# Patient Record
Sex: Male | Born: 1945 | Race: White | Hispanic: No | Marital: Married | Smoking: Never smoker
Health system: Southern US, Community
[De-identification: ages and names within clinical notes are randomized; demographics above are authoritative.]

## PROBLEM LIST (undated history)

## (undated) DIAGNOSIS — I2510 Atherosclerotic heart disease of native coronary artery without angina pectoris: Secondary | ICD-10-CM

## (undated) DIAGNOSIS — F32A Depression, unspecified: Secondary | ICD-10-CM

## (undated) DIAGNOSIS — M1990 Unspecified osteoarthritis, unspecified site: Secondary | ICD-10-CM

## (undated) DIAGNOSIS — I509 Heart failure, unspecified: Secondary | ICD-10-CM

## (undated) DIAGNOSIS — Z862 Personal history of diseases of the blood and blood-forming organs and certain disorders involving the immune mechanism: Secondary | ICD-10-CM

## (undated) DIAGNOSIS — E119 Type 2 diabetes mellitus without complications: Secondary | ICD-10-CM

## (undated) DIAGNOSIS — G4730 Sleep apnea, unspecified: Secondary | ICD-10-CM

## (undated) DIAGNOSIS — K529 Noninfective gastroenteritis and colitis, unspecified: Secondary | ICD-10-CM

## (undated) DIAGNOSIS — I4891 Unspecified atrial fibrillation: Secondary | ICD-10-CM

## (undated) DIAGNOSIS — N289 Disorder of kidney and ureter, unspecified: Secondary | ICD-10-CM

## (undated) DIAGNOSIS — I499 Cardiac arrhythmia, unspecified: Secondary | ICD-10-CM

## (undated) DIAGNOSIS — I10 Essential (primary) hypertension: Secondary | ICD-10-CM

## (undated) DIAGNOSIS — Z8719 Personal history of other diseases of the digestive system: Secondary | ICD-10-CM

## (undated) DIAGNOSIS — C801 Malignant (primary) neoplasm, unspecified: Secondary | ICD-10-CM

## (undated) DIAGNOSIS — F329 Major depressive disorder, single episode, unspecified: Secondary | ICD-10-CM

## (undated) DIAGNOSIS — J189 Pneumonia, unspecified organism: Secondary | ICD-10-CM

## (undated) DIAGNOSIS — K859 Acute pancreatitis without necrosis or infection, unspecified: Secondary | ICD-10-CM

## (undated) DIAGNOSIS — R0600 Dyspnea, unspecified: Secondary | ICD-10-CM

## (undated) DIAGNOSIS — D649 Anemia, unspecified: Secondary | ICD-10-CM

## (undated) HISTORY — PX: CORONARY ANGIOPLASTY: SHX604

## (undated) HISTORY — PX: ABDOMINAL SURGERY: SHX537

## (undated) HISTORY — PX: COLONOSCOPY: SHX174

## (undated) HISTORY — PX: CHOLECYSTECTOMY: SHX55

## (undated) HISTORY — PX: TONSILLECTOMY: SUR1361

## (undated) HISTORY — PX: CARDIAC CATHETERIZATION: SHX172

---

## 2011-04-21 DIAGNOSIS — I2510 Atherosclerotic heart disease of native coronary artery without angina pectoris: Secondary | ICD-10-CM

## 2011-04-21 HISTORY — DX: Atherosclerotic heart disease of native coronary artery without angina pectoris: I25.10

## 2012-04-20 HISTORY — PX: KIDNEY TRANSPLANT: SHX239

## 2012-08-11 DIAGNOSIS — N186 End stage renal disease: Secondary | ICD-10-CM | POA: Insufficient documentation

## 2013-02-21 DIAGNOSIS — I2510 Atherosclerotic heart disease of native coronary artery without angina pectoris: Secondary | ICD-10-CM | POA: Insufficient documentation

## 2014-02-09 DIAGNOSIS — I495 Sick sinus syndrome: Secondary | ICD-10-CM | POA: Insufficient documentation

## 2014-11-19 DIAGNOSIS — I4819 Other persistent atrial fibrillation: Secondary | ICD-10-CM | POA: Insufficient documentation

## 2015-01-03 DIAGNOSIS — N189 Chronic kidney disease, unspecified: Secondary | ICD-10-CM | POA: Insufficient documentation

## 2015-09-09 DIAGNOSIS — C7A1 Malignant poorly differentiated neuroendocrine tumors: Secondary | ICD-10-CM | POA: Diagnosis not present

## 2015-09-17 DIAGNOSIS — Z940 Kidney transplant status: Secondary | ICD-10-CM | POA: Diagnosis not present

## 2015-09-17 DIAGNOSIS — N183 Chronic kidney disease, stage 3 (moderate): Secondary | ICD-10-CM | POA: Diagnosis not present

## 2015-09-17 DIAGNOSIS — Z418 Encounter for other procedures for purposes other than remedying health state: Secondary | ICD-10-CM | POA: Diagnosis not present

## 2015-09-17 DIAGNOSIS — E1122 Type 2 diabetes mellitus with diabetic chronic kidney disease: Secondary | ICD-10-CM | POA: Diagnosis not present

## 2015-09-23 DIAGNOSIS — E1101 Type 2 diabetes mellitus with hyperosmolarity with coma: Secondary | ICD-10-CM | POA: Diagnosis not present

## 2015-09-23 DIAGNOSIS — E1122 Type 2 diabetes mellitus with diabetic chronic kidney disease: Secondary | ICD-10-CM | POA: Diagnosis not present

## 2015-09-23 DIAGNOSIS — Z418 Encounter for other procedures for purposes other than remedying health state: Secondary | ICD-10-CM | POA: Diagnosis not present

## 2015-09-23 DIAGNOSIS — N183 Chronic kidney disease, stage 3 (moderate): Secondary | ICD-10-CM | POA: Diagnosis not present

## 2015-09-23 DIAGNOSIS — Z940 Kidney transplant status: Secondary | ICD-10-CM | POA: Diagnosis not present

## 2015-09-23 DIAGNOSIS — R197 Diarrhea, unspecified: Secondary | ICD-10-CM | POA: Diagnosis not present

## 2015-09-23 DIAGNOSIS — Z794 Long term (current) use of insulin: Secondary | ICD-10-CM | POA: Diagnosis not present

## 2015-09-25 DIAGNOSIS — Z940 Kidney transplant status: Secondary | ICD-10-CM | POA: Diagnosis not present

## 2015-09-30 DIAGNOSIS — Z940 Kidney transplant status: Secondary | ICD-10-CM | POA: Diagnosis not present

## 2015-09-30 DIAGNOSIS — N2581 Secondary hyperparathyroidism of renal origin: Secondary | ICD-10-CM | POA: Diagnosis not present

## 2015-09-30 DIAGNOSIS — N183 Chronic kidney disease, stage 3 (moderate): Secondary | ICD-10-CM | POA: Diagnosis not present

## 2015-09-30 DIAGNOSIS — Z5181 Encounter for therapeutic drug level monitoring: Secondary | ICD-10-CM | POA: Diagnosis not present

## 2015-09-30 DIAGNOSIS — E1165 Type 2 diabetes mellitus with hyperglycemia: Secondary | ICD-10-CM | POA: Diagnosis not present

## 2015-09-30 DIAGNOSIS — Z4822 Encounter for aftercare following kidney transplant: Secondary | ICD-10-CM | POA: Diagnosis not present

## 2015-10-07 DIAGNOSIS — R197 Diarrhea, unspecified: Secondary | ICD-10-CM | POA: Diagnosis not present

## 2015-10-07 DIAGNOSIS — Z940 Kidney transplant status: Secondary | ICD-10-CM | POA: Diagnosis not present

## 2015-10-07 DIAGNOSIS — I429 Cardiomyopathy, unspecified: Secondary | ICD-10-CM | POA: Diagnosis not present

## 2015-10-07 DIAGNOSIS — Z418 Encounter for other procedures for purposes other than remedying health state: Secondary | ICD-10-CM | POA: Diagnosis not present

## 2015-10-11 DIAGNOSIS — R634 Abnormal weight loss: Secondary | ICD-10-CM | POA: Diagnosis not present

## 2015-10-11 DIAGNOSIS — R799 Abnormal finding of blood chemistry, unspecified: Secondary | ICD-10-CM | POA: Diagnosis not present

## 2015-10-11 DIAGNOSIS — Z940 Kidney transplant status: Secondary | ICD-10-CM | POA: Diagnosis not present

## 2015-10-11 DIAGNOSIS — Z8719 Personal history of other diseases of the digestive system: Secondary | ICD-10-CM | POA: Insufficient documentation

## 2015-10-11 DIAGNOSIS — K529 Noninfective gastroenteritis and colitis, unspecified: Secondary | ICD-10-CM | POA: Diagnosis not present

## 2015-10-11 DIAGNOSIS — D638 Anemia in other chronic diseases classified elsewhere: Secondary | ICD-10-CM | POA: Diagnosis not present

## 2015-10-11 DIAGNOSIS — Z418 Encounter for other procedures for purposes other than remedying health state: Secondary | ICD-10-CM | POA: Diagnosis not present

## 2015-10-11 DIAGNOSIS — K8689 Other specified diseases of pancreas: Secondary | ICD-10-CM | POA: Diagnosis not present

## 2015-10-11 DIAGNOSIS — R197 Diarrhea, unspecified: Secondary | ICD-10-CM | POA: Diagnosis not present

## 2015-10-11 DIAGNOSIS — R161 Splenomegaly, not elsewhere classified: Secondary | ICD-10-CM | POA: Diagnosis not present

## 2015-10-12 DIAGNOSIS — K8689 Other specified diseases of pancreas: Secondary | ICD-10-CM | POA: Diagnosis not present

## 2015-10-12 DIAGNOSIS — K529 Noninfective gastroenteritis and colitis, unspecified: Secondary | ICD-10-CM | POA: Diagnosis not present

## 2015-10-23 DIAGNOSIS — N189 Chronic kidney disease, unspecified: Secondary | ICD-10-CM | POA: Diagnosis not present

## 2015-10-23 DIAGNOSIS — E875 Hyperkalemia: Secondary | ICD-10-CM | POA: Diagnosis not present

## 2015-10-23 DIAGNOSIS — R197 Diarrhea, unspecified: Secondary | ICD-10-CM | POA: Diagnosis not present

## 2015-10-23 DIAGNOSIS — K529 Noninfective gastroenteritis and colitis, unspecified: Secondary | ICD-10-CM | POA: Diagnosis not present

## 2015-10-23 DIAGNOSIS — I959 Hypotension, unspecified: Secondary | ICD-10-CM | POA: Diagnosis not present

## 2015-10-23 DIAGNOSIS — Z992 Dependence on renal dialysis: Secondary | ICD-10-CM | POA: Diagnosis not present

## 2015-10-23 DIAGNOSIS — Z940 Kidney transplant status: Secondary | ICD-10-CM | POA: Diagnosis not present

## 2015-10-23 DIAGNOSIS — Z888 Allergy status to other drugs, medicaments and biological substances status: Secondary | ICD-10-CM | POA: Diagnosis not present

## 2015-10-23 DIAGNOSIS — I129 Hypertensive chronic kidney disease with stage 1 through stage 4 chronic kidney disease, or unspecified chronic kidney disease: Secondary | ICD-10-CM | POA: Diagnosis not present

## 2015-10-23 DIAGNOSIS — E860 Dehydration: Secondary | ICD-10-CM | POA: Diagnosis not present

## 2015-10-24 DIAGNOSIS — Z940 Kidney transplant status: Secondary | ICD-10-CM | POA: Diagnosis not present

## 2015-10-26 DIAGNOSIS — K529 Noninfective gastroenteritis and colitis, unspecified: Secondary | ICD-10-CM | POA: Diagnosis not present

## 2015-11-05 DIAGNOSIS — D692 Other nonthrombocytopenic purpura: Secondary | ICD-10-CM | POA: Diagnosis not present

## 2015-11-05 DIAGNOSIS — L570 Actinic keratosis: Secondary | ICD-10-CM | POA: Diagnosis not present

## 2015-11-07 DIAGNOSIS — K9049 Malabsorption due to intolerance, not elsewhere classified: Secondary | ICD-10-CM | POA: Diagnosis not present

## 2015-11-07 DIAGNOSIS — R197 Diarrhea, unspecified: Secondary | ICD-10-CM | POA: Diagnosis not present

## 2015-11-07 DIAGNOSIS — D899 Disorder involving the immune mechanism, unspecified: Secondary | ICD-10-CM | POA: Diagnosis not present

## 2015-11-07 DIAGNOSIS — I429 Cardiomyopathy, unspecified: Secondary | ICD-10-CM | POA: Diagnosis not present

## 2015-11-07 DIAGNOSIS — I481 Persistent atrial fibrillation: Secondary | ICD-10-CM | POA: Diagnosis not present

## 2015-11-07 DIAGNOSIS — Z418 Encounter for other procedures for purposes other than remedying health state: Secondary | ICD-10-CM | POA: Diagnosis not present

## 2015-11-07 DIAGNOSIS — Z940 Kidney transplant status: Secondary | ICD-10-CM | POA: Diagnosis not present

## 2015-11-07 DIAGNOSIS — K529 Noninfective gastroenteritis and colitis, unspecified: Secondary | ICD-10-CM | POA: Diagnosis not present

## 2015-11-13 DIAGNOSIS — E1149 Type 2 diabetes mellitus with other diabetic neurological complication: Secondary | ICD-10-CM | POA: Diagnosis not present

## 2015-11-13 DIAGNOSIS — E1129 Type 2 diabetes mellitus with other diabetic kidney complication: Secondary | ICD-10-CM | POA: Diagnosis not present

## 2015-11-13 DIAGNOSIS — E1151 Type 2 diabetes mellitus with diabetic peripheral angiopathy without gangrene: Secondary | ICD-10-CM | POA: Diagnosis not present

## 2015-11-20 DIAGNOSIS — Z940 Kidney transplant status: Secondary | ICD-10-CM | POA: Diagnosis not present

## 2015-11-25 DIAGNOSIS — D631 Anemia in chronic kidney disease: Secondary | ICD-10-CM | POA: Diagnosis not present

## 2015-11-25 DIAGNOSIS — R64 Cachexia: Secondary | ICD-10-CM | POA: Diagnosis not present

## 2015-11-25 DIAGNOSIS — E46 Unspecified protein-calorie malnutrition: Secondary | ICD-10-CM | POA: Diagnosis not present

## 2015-11-25 DIAGNOSIS — N183 Chronic kidney disease, stage 3 (moderate): Secondary | ICD-10-CM | POA: Diagnosis not present

## 2015-11-25 DIAGNOSIS — E1151 Type 2 diabetes mellitus with diabetic peripheral angiopathy without gangrene: Secondary | ICD-10-CM | POA: Diagnosis not present

## 2015-11-25 DIAGNOSIS — R634 Abnormal weight loss: Secondary | ICD-10-CM | POA: Diagnosis not present

## 2015-11-25 DIAGNOSIS — N189 Chronic kidney disease, unspecified: Secondary | ICD-10-CM | POA: Diagnosis not present

## 2015-11-25 DIAGNOSIS — I129 Hypertensive chronic kidney disease with stage 1 through stage 4 chronic kidney disease, or unspecified chronic kidney disease: Secondary | ICD-10-CM | POA: Diagnosis not present

## 2015-11-25 DIAGNOSIS — I4891 Unspecified atrial fibrillation: Secondary | ICD-10-CM | POA: Diagnosis not present

## 2015-11-25 DIAGNOSIS — K863 Pseudocyst of pancreas: Secondary | ICD-10-CM | POA: Diagnosis not present

## 2015-11-25 DIAGNOSIS — K529 Noninfective gastroenteritis and colitis, unspecified: Secondary | ICD-10-CM | POA: Diagnosis not present

## 2015-12-02 DIAGNOSIS — Z4822 Encounter for aftercare following kidney transplant: Secondary | ICD-10-CM | POA: Diagnosis not present

## 2015-12-02 DIAGNOSIS — R197 Diarrhea, unspecified: Secondary | ICD-10-CM | POA: Diagnosis not present

## 2015-12-02 DIAGNOSIS — E1165 Type 2 diabetes mellitus with hyperglycemia: Secondary | ICD-10-CM | POA: Diagnosis not present

## 2015-12-02 DIAGNOSIS — N183 Chronic kidney disease, stage 3 (moderate): Secondary | ICD-10-CM | POA: Diagnosis not present

## 2015-12-02 DIAGNOSIS — Z5181 Encounter for therapeutic drug level monitoring: Secondary | ICD-10-CM | POA: Diagnosis not present

## 2015-12-02 DIAGNOSIS — Z940 Kidney transplant status: Secondary | ICD-10-CM | POA: Diagnosis not present

## 2015-12-05 DIAGNOSIS — L84 Corns and callosities: Secondary | ICD-10-CM | POA: Diagnosis not present

## 2015-12-05 DIAGNOSIS — K6389 Other specified diseases of intestine: Secondary | ICD-10-CM | POA: Diagnosis not present

## 2015-12-05 DIAGNOSIS — M109 Gout, unspecified: Secondary | ICD-10-CM | POA: Diagnosis present

## 2015-12-05 DIAGNOSIS — Z5181 Encounter for therapeutic drug level monitoring: Secondary | ICD-10-CM | POA: Diagnosis not present

## 2015-12-05 DIAGNOSIS — I429 Cardiomyopathy, unspecified: Secondary | ICD-10-CM | POA: Diagnosis not present

## 2015-12-05 DIAGNOSIS — R634 Abnormal weight loss: Secondary | ICD-10-CM | POA: Diagnosis not present

## 2015-12-05 DIAGNOSIS — K861 Other chronic pancreatitis: Secondary | ICD-10-CM | POA: Diagnosis present

## 2015-12-05 DIAGNOSIS — I151 Hypertension secondary to other renal disorders: Secondary | ICD-10-CM | POA: Diagnosis not present

## 2015-12-05 DIAGNOSIS — N2889 Other specified disorders of kidney and ureter: Secondary | ICD-10-CM | POA: Diagnosis not present

## 2015-12-05 DIAGNOSIS — L409 Psoriasis, unspecified: Secondary | ICD-10-CM | POA: Diagnosis present

## 2015-12-05 DIAGNOSIS — K862 Cyst of pancreas: Secondary | ICD-10-CM | POA: Diagnosis not present

## 2015-12-05 DIAGNOSIS — A0811 Acute gastroenteropathy due to Norwalk agent: Secondary | ICD-10-CM | POA: Diagnosis present

## 2015-12-05 DIAGNOSIS — D735 Infarction of spleen: Secondary | ICD-10-CM | POA: Diagnosis not present

## 2015-12-05 DIAGNOSIS — K5730 Diverticulosis of large intestine without perforation or abscess without bleeding: Secondary | ICD-10-CM | POA: Diagnosis not present

## 2015-12-05 DIAGNOSIS — T8619 Other complication of kidney transplant: Secondary | ICD-10-CM | POA: Diagnosis present

## 2015-12-05 DIAGNOSIS — E43 Unspecified severe protein-calorie malnutrition: Secondary | ICD-10-CM | POA: Diagnosis present

## 2015-12-05 DIAGNOSIS — L851 Acquired keratosis [keratoderma] palmaris et plantaris: Secondary | ICD-10-CM | POA: Diagnosis not present

## 2015-12-05 DIAGNOSIS — Z681 Body mass index (BMI) 19 or less, adult: Secondary | ICD-10-CM | POA: Diagnosis not present

## 2015-12-05 DIAGNOSIS — K648 Other hemorrhoids: Secondary | ICD-10-CM | POA: Diagnosis not present

## 2015-12-05 DIAGNOSIS — I10 Essential (primary) hypertension: Secondary | ICD-10-CM | POA: Diagnosis not present

## 2015-12-05 DIAGNOSIS — D7581 Myelofibrosis: Secondary | ICD-10-CM | POA: Diagnosis not present

## 2015-12-05 DIAGNOSIS — K449 Diaphragmatic hernia without obstruction or gangrene: Secondary | ICD-10-CM | POA: Diagnosis not present

## 2015-12-05 DIAGNOSIS — K529 Noninfective gastroenteritis and colitis, unspecified: Secondary | ICD-10-CM | POA: Diagnosis not present

## 2015-12-05 DIAGNOSIS — E1142 Type 2 diabetes mellitus with diabetic polyneuropathy: Secondary | ICD-10-CM | POA: Diagnosis present

## 2015-12-05 DIAGNOSIS — I120 Hypertensive chronic kidney disease with stage 5 chronic kidney disease or end stage renal disease: Secondary | ICD-10-CM | POA: Diagnosis not present

## 2015-12-05 DIAGNOSIS — K639 Disease of intestine, unspecified: Secondary | ICD-10-CM | POA: Diagnosis not present

## 2015-12-05 DIAGNOSIS — I4891 Unspecified atrial fibrillation: Secondary | ICD-10-CM | POA: Diagnosis not present

## 2015-12-05 DIAGNOSIS — I481 Persistent atrial fibrillation: Secondary | ICD-10-CM | POA: Diagnosis present

## 2015-12-05 DIAGNOSIS — Z940 Kidney transplant status: Secondary | ICD-10-CM | POA: Diagnosis not present

## 2015-12-05 DIAGNOSIS — K909 Intestinal malabsorption, unspecified: Secondary | ICD-10-CM | POA: Diagnosis not present

## 2015-12-05 DIAGNOSIS — R198 Other specified symptoms and signs involving the digestive system and abdomen: Secondary | ICD-10-CM | POA: Diagnosis not present

## 2015-12-05 DIAGNOSIS — D696 Thrombocytopenia, unspecified: Secondary | ICD-10-CM | POA: Diagnosis not present

## 2015-12-05 DIAGNOSIS — K838 Other specified diseases of biliary tract: Secondary | ICD-10-CM | POA: Diagnosis not present

## 2015-12-05 DIAGNOSIS — Z8719 Personal history of other diseases of the digestive system: Secondary | ICD-10-CM | POA: Diagnosis not present

## 2015-12-05 DIAGNOSIS — E1122 Type 2 diabetes mellitus with diabetic chronic kidney disease: Secondary | ICD-10-CM | POA: Diagnosis present

## 2015-12-05 DIAGNOSIS — E872 Acidosis: Secondary | ICD-10-CM | POA: Diagnosis present

## 2015-12-05 DIAGNOSIS — I2510 Atherosclerotic heart disease of native coronary artery without angina pectoris: Secondary | ICD-10-CM | POA: Diagnosis present

## 2015-12-05 DIAGNOSIS — D61818 Other pancytopenia: Secondary | ICD-10-CM | POA: Diagnosis present

## 2015-12-05 DIAGNOSIS — N186 End stage renal disease: Secondary | ICD-10-CM | POA: Diagnosis not present

## 2015-12-05 DIAGNOSIS — K3184 Gastroparesis: Secondary | ICD-10-CM | POA: Diagnosis present

## 2015-12-05 DIAGNOSIS — K863 Pseudocyst of pancreas: Secondary | ICD-10-CM | POA: Diagnosis present

## 2015-12-05 DIAGNOSIS — R197 Diarrhea, unspecified: Secondary | ICD-10-CM | POA: Diagnosis not present

## 2015-12-05 DIAGNOSIS — K219 Gastro-esophageal reflux disease without esophagitis: Secondary | ICD-10-CM | POA: Diagnosis present

## 2015-12-05 DIAGNOSIS — R011 Cardiac murmur, unspecified: Secondary | ICD-10-CM | POA: Diagnosis not present

## 2015-12-05 DIAGNOSIS — E1143 Type 2 diabetes mellitus with diabetic autonomic (poly)neuropathy: Secondary | ICD-10-CM | POA: Diagnosis present

## 2015-12-05 DIAGNOSIS — E860 Dehydration: Secondary | ICD-10-CM | POA: Diagnosis present

## 2015-12-05 DIAGNOSIS — K317 Polyp of stomach and duodenum: Secondary | ICD-10-CM | POA: Diagnosis not present

## 2015-12-05 DIAGNOSIS — E861 Hypovolemia: Secondary | ICD-10-CM | POA: Diagnosis present

## 2015-12-05 DIAGNOSIS — E11628 Type 2 diabetes mellitus with other skin complications: Secondary | ICD-10-CM | POA: Diagnosis not present

## 2015-12-05 DIAGNOSIS — D801 Nonfamilial hypogammaglobulinemia: Secondary | ICD-10-CM | POA: Diagnosis present

## 2015-12-05 DIAGNOSIS — E538 Deficiency of other specified B group vitamins: Secondary | ICD-10-CM | POA: Diagnosis present

## 2015-12-05 DIAGNOSIS — Z79899 Other long term (current) drug therapy: Secondary | ICD-10-CM | POA: Diagnosis not present

## 2015-12-05 DIAGNOSIS — I864 Gastric varices: Secondary | ICD-10-CM | POA: Diagnosis not present

## 2015-12-05 DIAGNOSIS — D649 Anemia, unspecified: Secondary | ICD-10-CM | POA: Diagnosis not present

## 2015-12-05 DIAGNOSIS — D899 Disorder involving the immune mechanism, unspecified: Secondary | ICD-10-CM | POA: Diagnosis not present

## 2015-12-05 DIAGNOSIS — Z418 Encounter for other procedures for purposes other than remedying health state: Secondary | ICD-10-CM | POA: Diagnosis not present

## 2015-12-05 DIAGNOSIS — N179 Acute kidney failure, unspecified: Secondary | ICD-10-CM | POA: Diagnosis present

## 2015-12-05 DIAGNOSIS — D693 Immune thrombocytopenic purpura: Secondary | ICD-10-CM | POA: Diagnosis present

## 2015-12-05 DIAGNOSIS — E1140 Type 2 diabetes mellitus with diabetic neuropathy, unspecified: Secondary | ICD-10-CM | POA: Diagnosis not present

## 2015-12-05 DIAGNOSIS — M79672 Pain in left foot: Secondary | ICD-10-CM | POA: Diagnosis not present

## 2015-12-05 DIAGNOSIS — R161 Splenomegaly, not elsewhere classified: Secondary | ICD-10-CM | POA: Diagnosis not present

## 2015-12-05 DIAGNOSIS — N189 Chronic kidney disease, unspecified: Secondary | ICD-10-CM | POA: Diagnosis present

## 2015-12-08 DIAGNOSIS — D61818 Other pancytopenia: Secondary | ICD-10-CM | POA: Insufficient documentation

## 2015-12-08 DIAGNOSIS — E538 Deficiency of other specified B group vitamins: Secondary | ICD-10-CM | POA: Insufficient documentation

## 2015-12-24 DIAGNOSIS — N189 Chronic kidney disease, unspecified: Secondary | ICD-10-CM | POA: Diagnosis not present

## 2015-12-24 DIAGNOSIS — D631 Anemia in chronic kidney disease: Secondary | ICD-10-CM | POA: Diagnosis not present

## 2015-12-24 DIAGNOSIS — R64 Cachexia: Secondary | ICD-10-CM | POA: Diagnosis not present

## 2015-12-24 DIAGNOSIS — I4891 Unspecified atrial fibrillation: Secondary | ICD-10-CM | POA: Diagnosis not present

## 2015-12-24 DIAGNOSIS — R109 Unspecified abdominal pain: Secondary | ICD-10-CM | POA: Diagnosis not present

## 2015-12-24 DIAGNOSIS — R634 Abnormal weight loss: Secondary | ICD-10-CM | POA: Diagnosis not present

## 2015-12-24 DIAGNOSIS — K529 Noninfective gastroenteritis and colitis, unspecified: Secondary | ICD-10-CM | POA: Diagnosis not present

## 2015-12-24 DIAGNOSIS — E790 Hyperuricemia without signs of inflammatory arthritis and tophaceous disease: Secondary | ICD-10-CM | POA: Diagnosis not present

## 2015-12-27 DIAGNOSIS — R197 Diarrhea, unspecified: Secondary | ICD-10-CM | POA: Diagnosis not present

## 2015-12-27 DIAGNOSIS — N186 End stage renal disease: Secondary | ICD-10-CM | POA: Diagnosis not present

## 2015-12-27 DIAGNOSIS — N179 Acute kidney failure, unspecified: Secondary | ICD-10-CM | POA: Diagnosis not present

## 2015-12-27 DIAGNOSIS — A09 Infectious gastroenteritis and colitis, unspecified: Secondary | ICD-10-CM | POA: Diagnosis not present

## 2015-12-27 DIAGNOSIS — Z8719 Personal history of other diseases of the digestive system: Secondary | ICD-10-CM | POA: Diagnosis not present

## 2015-12-27 DIAGNOSIS — R634 Abnormal weight loss: Secondary | ICD-10-CM | POA: Diagnosis not present

## 2015-12-27 DIAGNOSIS — I10 Essential (primary) hypertension: Secondary | ICD-10-CM | POA: Diagnosis not present

## 2015-12-27 DIAGNOSIS — Z940 Kidney transplant status: Secondary | ICD-10-CM | POA: Diagnosis not present

## 2015-12-27 DIAGNOSIS — A0811 Acute gastroenteropathy due to Norwalk agent: Secondary | ICD-10-CM | POA: Diagnosis not present

## 2015-12-27 DIAGNOSIS — Z418 Encounter for other procedures for purposes other than remedying health state: Secondary | ICD-10-CM | POA: Diagnosis not present

## 2015-12-27 DIAGNOSIS — I429 Cardiomyopathy, unspecified: Secondary | ICD-10-CM | POA: Diagnosis not present

## 2015-12-27 DIAGNOSIS — D899 Disorder involving the immune mechanism, unspecified: Secondary | ICD-10-CM | POA: Diagnosis not present

## 2016-01-01 DIAGNOSIS — I481 Persistent atrial fibrillation: Secondary | ICD-10-CM | POA: Diagnosis not present

## 2016-01-01 DIAGNOSIS — D899 Disorder involving the immune mechanism, unspecified: Secondary | ICD-10-CM | POA: Diagnosis not present

## 2016-01-01 DIAGNOSIS — Z940 Kidney transplant status: Secondary | ICD-10-CM | POA: Diagnosis not present

## 2016-01-01 DIAGNOSIS — I5042 Chronic combined systolic (congestive) and diastolic (congestive) heart failure: Secondary | ICD-10-CM | POA: Diagnosis not present

## 2016-01-01 DIAGNOSIS — A09 Infectious gastroenteritis and colitis, unspecified: Secondary | ICD-10-CM | POA: Diagnosis not present

## 2016-01-01 DIAGNOSIS — I2510 Atherosclerotic heart disease of native coronary artery without angina pectoris: Secondary | ICD-10-CM | POA: Diagnosis not present

## 2016-01-01 DIAGNOSIS — N179 Acute kidney failure, unspecified: Secondary | ICD-10-CM | POA: Diagnosis not present

## 2016-01-01 DIAGNOSIS — A0811 Acute gastroenteropathy due to Norwalk agent: Secondary | ICD-10-CM | POA: Diagnosis not present

## 2016-01-01 DIAGNOSIS — K4090 Unilateral inguinal hernia, without obstruction or gangrene, not specified as recurrent: Secondary | ICD-10-CM | POA: Diagnosis not present

## 2016-01-03 DIAGNOSIS — I2510 Atherosclerotic heart disease of native coronary artery without angina pectoris: Secondary | ICD-10-CM | POA: Diagnosis not present

## 2016-01-03 DIAGNOSIS — I4891 Unspecified atrial fibrillation: Secondary | ICD-10-CM | POA: Diagnosis not present

## 2016-01-03 DIAGNOSIS — I429 Cardiomyopathy, unspecified: Secondary | ICD-10-CM | POA: Diagnosis not present

## 2016-01-04 DIAGNOSIS — Z940 Kidney transplant status: Secondary | ICD-10-CM | POA: Insufficient documentation

## 2016-01-06 DIAGNOSIS — A0811 Acute gastroenteropathy due to Norwalk agent: Secondary | ICD-10-CM | POA: Diagnosis not present

## 2016-01-06 DIAGNOSIS — D899 Disorder involving the immune mechanism, unspecified: Secondary | ICD-10-CM | POA: Diagnosis not present

## 2016-01-06 DIAGNOSIS — D61818 Other pancytopenia: Secondary | ICD-10-CM | POA: Diagnosis not present

## 2016-01-06 DIAGNOSIS — Z940 Kidney transplant status: Secondary | ICD-10-CM | POA: Diagnosis not present

## 2016-01-06 DIAGNOSIS — I481 Persistent atrial fibrillation: Secondary | ICD-10-CM | POA: Diagnosis not present

## 2016-01-06 DIAGNOSIS — N179 Acute kidney failure, unspecified: Secondary | ICD-10-CM | POA: Diagnosis not present

## 2016-01-07 DIAGNOSIS — D899 Disorder involving the immune mechanism, unspecified: Secondary | ICD-10-CM | POA: Diagnosis not present

## 2016-01-07 DIAGNOSIS — I481 Persistent atrial fibrillation: Secondary | ICD-10-CM | POA: Diagnosis not present

## 2016-01-07 DIAGNOSIS — L814 Other melanin hyperpigmentation: Secondary | ICD-10-CM | POA: Diagnosis not present

## 2016-01-07 DIAGNOSIS — L570 Actinic keratosis: Secondary | ICD-10-CM | POA: Diagnosis not present

## 2016-01-07 DIAGNOSIS — Z85828 Personal history of other malignant neoplasm of skin: Secondary | ICD-10-CM | POA: Diagnosis not present

## 2016-01-07 DIAGNOSIS — L821 Other seborrheic keratosis: Secondary | ICD-10-CM | POA: Diagnosis not present

## 2016-01-07 DIAGNOSIS — I781 Nevus, non-neoplastic: Secondary | ICD-10-CM | POA: Diagnosis not present

## 2016-01-07 DIAGNOSIS — A0811 Acute gastroenteropathy due to Norwalk agent: Secondary | ICD-10-CM | POA: Diagnosis not present

## 2016-01-07 DIAGNOSIS — D61818 Other pancytopenia: Secondary | ICD-10-CM | POA: Diagnosis not present

## 2016-01-07 DIAGNOSIS — Z940 Kidney transplant status: Secondary | ICD-10-CM | POA: Diagnosis not present

## 2016-01-15 DIAGNOSIS — N186 End stage renal disease: Secondary | ICD-10-CM | POA: Diagnosis not present

## 2016-01-15 DIAGNOSIS — D899 Disorder involving the immune mechanism, unspecified: Secondary | ICD-10-CM

## 2016-01-15 DIAGNOSIS — E1159 Type 2 diabetes mellitus with other circulatory complications: Secondary | ICD-10-CM | POA: Insufficient documentation

## 2016-01-15 DIAGNOSIS — D849 Immunodeficiency, unspecified: Secondary | ICD-10-CM | POA: Insufficient documentation

## 2016-01-15 DIAGNOSIS — Z79899 Other long term (current) drug therapy: Secondary | ICD-10-CM | POA: Diagnosis not present

## 2016-01-15 DIAGNOSIS — I4891 Unspecified atrial fibrillation: Secondary | ICD-10-CM | POA: Diagnosis not present

## 2016-01-15 DIAGNOSIS — Z7901 Long term (current) use of anticoagulants: Secondary | ICD-10-CM | POA: Diagnosis not present

## 2016-01-15 DIAGNOSIS — Z8719 Personal history of other diseases of the digestive system: Secondary | ICD-10-CM | POA: Insufficient documentation

## 2016-01-15 DIAGNOSIS — E1122 Type 2 diabetes mellitus with diabetic chronic kidney disease: Secondary | ICD-10-CM | POA: Diagnosis not present

## 2016-01-15 DIAGNOSIS — I10 Essential (primary) hypertension: Secondary | ICD-10-CM | POA: Insufficient documentation

## 2016-01-15 DIAGNOSIS — K529 Noninfective gastroenteritis and colitis, unspecified: Secondary | ICD-10-CM | POA: Diagnosis not present

## 2016-01-15 DIAGNOSIS — I120 Hypertensive chronic kidney disease with stage 5 chronic kidney disease or end stage renal disease: Secondary | ICD-10-CM | POA: Diagnosis not present

## 2016-01-15 DIAGNOSIS — Z4822 Encounter for aftercare following kidney transplant: Secondary | ICD-10-CM | POA: Diagnosis not present

## 2016-01-15 DIAGNOSIS — Z940 Kidney transplant status: Secondary | ICD-10-CM | POA: Diagnosis not present

## 2016-01-21 DIAGNOSIS — D899 Disorder involving the immune mechanism, unspecified: Secondary | ICD-10-CM | POA: Diagnosis not present

## 2016-01-21 DIAGNOSIS — Z940 Kidney transplant status: Secondary | ICD-10-CM | POA: Diagnosis not present

## 2016-01-21 DIAGNOSIS — I428 Other cardiomyopathies: Secondary | ICD-10-CM | POA: Diagnosis not present

## 2016-01-21 DIAGNOSIS — A09 Infectious gastroenteritis and colitis, unspecified: Secondary | ICD-10-CM | POA: Diagnosis not present

## 2016-01-21 DIAGNOSIS — I2510 Atherosclerotic heart disease of native coronary artery without angina pectoris: Secondary | ICD-10-CM | POA: Diagnosis not present

## 2016-01-21 DIAGNOSIS — N189 Chronic kidney disease, unspecified: Secondary | ICD-10-CM | POA: Diagnosis not present

## 2016-02-12 DIAGNOSIS — Z4822 Encounter for aftercare following kidney transplant: Secondary | ICD-10-CM | POA: Diagnosis not present

## 2016-02-12 DIAGNOSIS — Z79899 Other long term (current) drug therapy: Secondary | ICD-10-CM | POA: Diagnosis not present

## 2016-03-11 DIAGNOSIS — Z87898 Personal history of other specified conditions: Secondary | ICD-10-CM | POA: Diagnosis not present

## 2016-03-11 DIAGNOSIS — Z4822 Encounter for aftercare following kidney transplant: Secondary | ICD-10-CM | POA: Diagnosis not present

## 2016-03-11 DIAGNOSIS — E1159 Type 2 diabetes mellitus with other circulatory complications: Secondary | ICD-10-CM | POA: Diagnosis not present

## 2016-03-11 DIAGNOSIS — I4891 Unspecified atrial fibrillation: Secondary | ICD-10-CM | POA: Diagnosis not present

## 2016-03-11 DIAGNOSIS — Z940 Kidney transplant status: Secondary | ICD-10-CM | POA: Diagnosis not present

## 2016-03-11 DIAGNOSIS — K859 Acute pancreatitis without necrosis or infection, unspecified: Secondary | ICD-10-CM | POA: Diagnosis not present

## 2016-03-11 DIAGNOSIS — D899 Disorder involving the immune mechanism, unspecified: Secondary | ICD-10-CM | POA: Diagnosis not present

## 2016-03-11 DIAGNOSIS — Z794 Long term (current) use of insulin: Secondary | ICD-10-CM | POA: Diagnosis not present

## 2016-03-11 DIAGNOSIS — Z79899 Other long term (current) drug therapy: Secondary | ICD-10-CM | POA: Diagnosis not present

## 2016-03-11 DIAGNOSIS — E118 Type 2 diabetes mellitus with unspecified complications: Secondary | ICD-10-CM | POA: Diagnosis not present

## 2016-03-11 DIAGNOSIS — K529 Noninfective gastroenteritis and colitis, unspecified: Secondary | ICD-10-CM | POA: Diagnosis not present

## 2016-03-18 DIAGNOSIS — I864 Gastric varices: Secondary | ICD-10-CM | POA: Diagnosis not present

## 2016-03-18 DIAGNOSIS — Z8719 Personal history of other diseases of the digestive system: Secondary | ICD-10-CM | POA: Diagnosis not present

## 2016-03-18 DIAGNOSIS — Z794 Long term (current) use of insulin: Secondary | ICD-10-CM | POA: Diagnosis not present

## 2016-03-18 DIAGNOSIS — I82890 Acute embolism and thrombosis of other specified veins: Secondary | ICD-10-CM | POA: Diagnosis not present

## 2016-03-18 DIAGNOSIS — E1159 Type 2 diabetes mellitus with other circulatory complications: Secondary | ICD-10-CM | POA: Diagnosis not present

## 2016-03-18 DIAGNOSIS — D899 Disorder involving the immune mechanism, unspecified: Secondary | ICD-10-CM | POA: Diagnosis not present

## 2016-03-18 DIAGNOSIS — K863 Pseudocyst of pancreas: Secondary | ICD-10-CM | POA: Diagnosis not present

## 2016-03-18 DIAGNOSIS — Z940 Kidney transplant status: Secondary | ICD-10-CM | POA: Diagnosis not present

## 2016-03-18 DIAGNOSIS — K529 Noninfective gastroenteritis and colitis, unspecified: Secondary | ICD-10-CM | POA: Diagnosis not present

## 2016-03-19 DIAGNOSIS — I864 Gastric varices: Secondary | ICD-10-CM | POA: Insufficient documentation

## 2016-03-19 DIAGNOSIS — I82890 Acute embolism and thrombosis of other specified veins: Secondary | ICD-10-CM | POA: Insufficient documentation

## 2016-03-23 DIAGNOSIS — E1159 Type 2 diabetes mellitus with other circulatory complications: Secondary | ICD-10-CM | POA: Diagnosis not present

## 2016-03-23 DIAGNOSIS — I2510 Atherosclerotic heart disease of native coronary artery without angina pectoris: Secondary | ICD-10-CM | POA: Diagnosis not present

## 2016-03-23 DIAGNOSIS — Z794 Long term (current) use of insulin: Secondary | ICD-10-CM | POA: Diagnosis not present

## 2016-03-23 DIAGNOSIS — I429 Cardiomyopathy, unspecified: Secondary | ICD-10-CM | POA: Diagnosis not present

## 2016-03-23 DIAGNOSIS — I482 Chronic atrial fibrillation: Secondary | ICD-10-CM | POA: Diagnosis not present

## 2016-03-23 DIAGNOSIS — Z940 Kidney transplant status: Secondary | ICD-10-CM | POA: Diagnosis not present

## 2016-03-23 DIAGNOSIS — R0609 Other forms of dyspnea: Secondary | ICD-10-CM | POA: Diagnosis not present

## 2016-03-23 DIAGNOSIS — I10 Essential (primary) hypertension: Secondary | ICD-10-CM | POA: Diagnosis not present

## 2016-03-29 DIAGNOSIS — I5042 Chronic combined systolic (congestive) and diastolic (congestive) heart failure: Secondary | ICD-10-CM | POA: Insufficient documentation

## 2016-03-31 DIAGNOSIS — K863 Pseudocyst of pancreas: Secondary | ICD-10-CM | POA: Diagnosis not present

## 2016-03-31 DIAGNOSIS — K862 Cyst of pancreas: Secondary | ICD-10-CM | POA: Diagnosis not present

## 2016-03-31 DIAGNOSIS — R161 Splenomegaly, not elsewhere classified: Secondary | ICD-10-CM | POA: Diagnosis not present

## 2016-04-06 DIAGNOSIS — I110 Hypertensive heart disease with heart failure: Secondary | ICD-10-CM | POA: Diagnosis not present

## 2016-04-06 DIAGNOSIS — E875 Hyperkalemia: Secondary | ICD-10-CM | POA: Diagnosis not present

## 2016-04-06 DIAGNOSIS — I5042 Chronic combined systolic (congestive) and diastolic (congestive) heart failure: Secondary | ICD-10-CM | POA: Diagnosis not present

## 2016-04-06 DIAGNOSIS — R0609 Other forms of dyspnea: Secondary | ICD-10-CM | POA: Diagnosis not present

## 2016-04-06 DIAGNOSIS — I10 Essential (primary) hypertension: Secondary | ICD-10-CM | POA: Diagnosis not present

## 2016-04-06 DIAGNOSIS — I428 Other cardiomyopathies: Secondary | ICD-10-CM | POA: Diagnosis not present

## 2016-04-06 DIAGNOSIS — I083 Combined rheumatic disorders of mitral, aortic and tricuspid valves: Secondary | ICD-10-CM | POA: Diagnosis not present

## 2016-04-06 DIAGNOSIS — I429 Cardiomyopathy, unspecified: Secondary | ICD-10-CM | POA: Diagnosis not present

## 2016-04-06 DIAGNOSIS — E1159 Type 2 diabetes mellitus with other circulatory complications: Secondary | ICD-10-CM | POA: Diagnosis not present

## 2016-04-06 DIAGNOSIS — I482 Chronic atrial fibrillation: Secondary | ICD-10-CM | POA: Diagnosis not present

## 2016-04-06 DIAGNOSIS — I2510 Atherosclerotic heart disease of native coronary artery without angina pectoris: Secondary | ICD-10-CM | POA: Diagnosis not present

## 2016-04-06 DIAGNOSIS — Z79899 Other long term (current) drug therapy: Secondary | ICD-10-CM | POA: Diagnosis not present

## 2016-04-08 DIAGNOSIS — I482 Chronic atrial fibrillation: Secondary | ICD-10-CM | POA: Diagnosis not present

## 2016-04-08 DIAGNOSIS — Z940 Kidney transplant status: Secondary | ICD-10-CM | POA: Diagnosis not present

## 2016-04-08 DIAGNOSIS — D899 Disorder involving the immune mechanism, unspecified: Secondary | ICD-10-CM | POA: Diagnosis not present

## 2016-04-08 DIAGNOSIS — I5042 Chronic combined systolic (congestive) and diastolic (congestive) heart failure: Secondary | ICD-10-CM | POA: Diagnosis not present

## 2016-04-16 DIAGNOSIS — L97521 Non-pressure chronic ulcer of other part of left foot limited to breakdown of skin: Secondary | ICD-10-CM | POA: Diagnosis not present

## 2016-04-16 DIAGNOSIS — E1142 Type 2 diabetes mellitus with diabetic polyneuropathy: Secondary | ICD-10-CM | POA: Diagnosis not present

## 2016-04-29 DIAGNOSIS — E1142 Type 2 diabetes mellitus with diabetic polyneuropathy: Secondary | ICD-10-CM | POA: Diagnosis not present

## 2016-04-29 DIAGNOSIS — L97521 Non-pressure chronic ulcer of other part of left foot limited to breakdown of skin: Secondary | ICD-10-CM | POA: Diagnosis not present

## 2016-05-07 DIAGNOSIS — D899 Disorder involving the immune mechanism, unspecified: Secondary | ICD-10-CM | POA: Diagnosis not present

## 2016-05-07 DIAGNOSIS — Z940 Kidney transplant status: Secondary | ICD-10-CM | POA: Diagnosis not present

## 2016-05-07 DIAGNOSIS — Z87898 Personal history of other specified conditions: Secondary | ICD-10-CM | POA: Diagnosis not present

## 2016-05-12 DIAGNOSIS — Z5181 Encounter for therapeutic drug level monitoring: Secondary | ICD-10-CM | POA: Diagnosis not present

## 2016-05-12 DIAGNOSIS — Z940 Kidney transplant status: Secondary | ICD-10-CM | POA: Diagnosis not present

## 2016-05-12 DIAGNOSIS — E118 Type 2 diabetes mellitus with unspecified complications: Secondary | ICD-10-CM | POA: Diagnosis not present

## 2016-05-17 ENCOUNTER — Emergency Department (HOSPITAL_BASED_OUTPATIENT_CLINIC_OR_DEPARTMENT_OTHER): Payer: Medicare Other

## 2016-05-17 ENCOUNTER — Emergency Department (HOSPITAL_BASED_OUTPATIENT_CLINIC_OR_DEPARTMENT_OTHER)
Admission: EM | Admit: 2016-05-17 | Discharge: 2016-05-17 | Disposition: A | Discharge status: Home / Self Care | Payer: Medicare Other | Attending: Emergency Medicine | Admitting: Emergency Medicine

## 2016-05-17 ENCOUNTER — Encounter (HOSPITAL_BASED_OUTPATIENT_CLINIC_OR_DEPARTMENT_OTHER): Payer: Self-pay | Admitting: Emergency Medicine

## 2016-05-17 DIAGNOSIS — Y999 Unspecified external cause status: Secondary | ICD-10-CM | POA: Insufficient documentation

## 2016-05-17 DIAGNOSIS — M25021 Hemarthrosis, right elbow: Secondary | ICD-10-CM | POA: Diagnosis not present

## 2016-05-17 DIAGNOSIS — Y939 Activity, unspecified: Secondary | ICD-10-CM | POA: Insufficient documentation

## 2016-05-17 DIAGNOSIS — E119 Type 2 diabetes mellitus without complications: Secondary | ICD-10-CM | POA: Diagnosis not present

## 2016-05-17 DIAGNOSIS — M25421 Effusion, right elbow: Secondary | ICD-10-CM | POA: Diagnosis not present

## 2016-05-17 DIAGNOSIS — Y929 Unspecified place or not applicable: Secondary | ICD-10-CM | POA: Diagnosis not present

## 2016-05-17 DIAGNOSIS — I509 Heart failure, unspecified: Secondary | ICD-10-CM | POA: Insufficient documentation

## 2016-05-17 DIAGNOSIS — Z79899 Other long term (current) drug therapy: Secondary | ICD-10-CM | POA: Diagnosis not present

## 2016-05-17 DIAGNOSIS — S01111A Laceration without foreign body of right eyelid and periocular area, initial encounter: Secondary | ICD-10-CM | POA: Insufficient documentation

## 2016-05-17 DIAGNOSIS — K529 Noninfective gastroenteritis and colitis, unspecified: Secondary | ICD-10-CM | POA: Insufficient documentation

## 2016-05-17 DIAGNOSIS — S52124A Nondisplaced fracture of head of right radius, initial encounter for closed fracture: Secondary | ICD-10-CM | POA: Insufficient documentation

## 2016-05-17 DIAGNOSIS — I110 Hypertensive heart disease with heart failure: Secondary | ICD-10-CM | POA: Insufficient documentation

## 2016-05-17 DIAGNOSIS — S61412A Laceration without foreign body of left hand, initial encounter: Secondary | ICD-10-CM | POA: Diagnosis not present

## 2016-05-17 DIAGNOSIS — S0181XA Laceration without foreign body of other part of head, initial encounter: Secondary | ICD-10-CM | POA: Diagnosis not present

## 2016-05-17 DIAGNOSIS — S52181A Other fracture of upper end of right radius, initial encounter for closed fracture: Secondary | ICD-10-CM | POA: Diagnosis not present

## 2016-05-17 DIAGNOSIS — W0110XA Fall on same level from slipping, tripping and stumbling with subsequent striking against unspecified object, initial encounter: Secondary | ICD-10-CM | POA: Insufficient documentation

## 2016-05-17 DIAGNOSIS — W19XXXA Unspecified fall, initial encounter: Secondary | ICD-10-CM

## 2016-05-17 DIAGNOSIS — Z7901 Long term (current) use of anticoagulants: Secondary | ICD-10-CM | POA: Diagnosis not present

## 2016-05-17 DIAGNOSIS — S0990XA Unspecified injury of head, initial encounter: Secondary | ICD-10-CM | POA: Diagnosis not present

## 2016-05-17 HISTORY — DX: Essential (primary) hypertension: I10

## 2016-05-17 HISTORY — DX: Personal history of diseases of the blood and blood-forming organs and certain disorders involving the immune mechanism: Z86.2

## 2016-05-17 HISTORY — DX: Noninfective gastroenteritis and colitis, unspecified: K52.9

## 2016-05-17 HISTORY — DX: Heart failure, unspecified: I50.9

## 2016-05-17 HISTORY — DX: Acute pancreatitis without necrosis or infection, unspecified: K85.90

## 2016-05-17 HISTORY — DX: Unspecified atrial fibrillation: I48.91

## 2016-05-17 HISTORY — DX: Type 2 diabetes mellitus without complications: E11.9

## 2016-05-17 HISTORY — DX: Disorder of kidney and ureter, unspecified: N28.9

## 2016-05-17 LAB — CBC WITH DIFFERENTIAL/PLATELET
Basophils Absolute: 0 10*3/uL (ref 0.0–0.1)
Basophils Relative: 0 %
EOS PCT: 0 %
Eosinophils Absolute: 0 10*3/uL (ref 0.0–0.7)
HEMATOCRIT: 35.9 % — AB (ref 39.0–52.0)
Hemoglobin: 11.5 g/dL — ABNORMAL LOW (ref 13.0–17.0)
LYMPHS ABS: 0.8 10*3/uL (ref 0.7–4.0)
LYMPHS PCT: 17 %
MCH: 29.6 pg (ref 26.0–34.0)
MCHC: 32 g/dL (ref 30.0–36.0)
MCV: 92.3 fL (ref 78.0–100.0)
MONO ABS: 0.4 10*3/uL (ref 0.1–1.0)
Monocytes Relative: 9 %
Neutro Abs: 3.3 10*3/uL (ref 1.7–7.7)
Neutrophils Relative %: 74 %
Platelets: 65 10*3/uL — ABNORMAL LOW (ref 150–400)
RBC: 3.89 MIL/uL — AB (ref 4.22–5.81)
RDW: 17.2 % — AB (ref 11.5–15.5)
WBC: 4.4 10*3/uL (ref 4.0–10.5)

## 2016-05-17 LAB — BASIC METABOLIC PANEL
BUN: 30 mg/dL — ABNORMAL HIGH (ref 6–20)
CALCIUM: 8.4 mg/dL — AB (ref 8.9–10.3)
CHLORIDE: 110 mmol/L (ref 101–111)
CO2: 24 mmol/L (ref 22–32)
Creatinine, Ser: 2.02 mg/dL — ABNORMAL HIGH (ref 0.61–1.24)
GFR calc Af Amer: 37 mL/min — ABNORMAL LOW (ref >60)
GFR calc non Af Amer: 32 mL/min — ABNORMAL LOW (ref >60)
Glucose, Bld: 144 mg/dL — ABNORMAL HIGH (ref 65–99)
Potassium: 5.4 mmol/L — ABNORMAL HIGH (ref 3.5–5.1)
Sodium: 136 mmol/L (ref 135–145)

## 2016-05-17 MED ORDER — SODIUM POLYSTYRENE SULFONATE 15 GM/60ML PO SUSP
15.0000 g | Freq: Once | ORAL | 0 refills | Status: AC
Start: 2016-05-17 — End: 2016-05-17

## 2016-05-17 MED ORDER — CALCIUM POLYCARBOPHIL 625 MG PO TABS: 1250.0000 mg | ORAL_TABLET | Freq: Every day | ORAL | 0 refills | Status: DC

## 2016-05-17 MED ORDER — OXYCODONE HCL 5 MG PO TABS: 5.0000 mg | ORAL_TABLET | ORAL | 0 refills | Status: DC | PRN

## 2016-05-17 NOTE — ED Notes (Signed)
Pt teaching provided on medications that may cause drowsiness. Pt instructed not to drive or operate heavy machinery while taking the prescribed medication. Pt verbalized understanding.   

## 2016-05-17 NOTE — ED Triage Notes (Signed)
Patient fell today - hit his head on some tile after tripping and fell. The patient reports that he hit his right forehead, denies LOC, right knee pain, right elbow and left hand abrasion.

## 2016-05-17 NOTE — ED Notes (Signed)
ED Provider at bedside. 

## 2016-05-17 NOTE — Discharge Instructions (Signed)
Take tylenol 1000mg  every 6 hours as needed for pain and oxycodone for breakthrough. Take the kayexalate for potassium once.  Do not start your fiber prescription for 3 days.

## 2016-05-17 NOTE — ED Notes (Signed)
Patient transported to CT 

## 2016-05-17 NOTE — ED Provider Notes (Signed)
[facility] Provider Note   CSN: [id number] Arrival date & time: 05/17/16  1253     History   Chief Complaint Chief Complaint  Patient presents with  . Fall    HPI Matthew Rowland is a 71 y.o. male.  The history is provided by the patient.  Fall  This is a new problem. The current episode started 1 to 2 hours ago. The problem occurs constantly. The problem has not changed since onset.Pertinent negatives include no chest pain, no abdominal pain, no headaches and no shortness of breath. Associated symptoms comments: Right face laceration, right elbow pain, left hand skin tears. Nothing aggravates the symptoms. Nothing relieves the symptoms. He has tried nothing for the symptoms.    Past Medical History:  Diagnosis Date  . A-fib ([place])   . CHF (congestive heart failure) ([place])   . Chronic diarrhea   . Diabetes mellitus without complication (HCC)    borderline  . History of ITP   . Hypertension   . Pancreatitis   . Renal disorder     There are no active problems to display for this patient.   Past Surgical History:  Procedure Laterality Date  . ABDOMINAL SURGERY    . CHOLECYSTECTOMY    . KIDNEY TRANSPLANT         Home Medications    Prior to Admission medications   Medication Sig Start Date End Date Taking? Authorizing Provider  pantoprazole (PROTONIX) 40 MG tablet Take 40 mg by mouth daily.   Yes Historical Provider, MD  prednisoLONE 5 MG TABS tablet Take by mouth.   Yes Historical Provider, MD  rivaroxaban (XARELTO) 20 MG TABS tablet Take 20 mg by mouth daily with supper.   Yes Historical Provider, MD  tacrolimus (PROGRAF) 1 MG capsule Take 1 mg by mouth 2 (two) times daily.   Yes Historical Provider, MD    Family History History reviewed. No pertinent family history.  Social History Social History  Substance Use Topics  . Smoking status: Never Smoker  . Smokeless tobacco: Never Used  . Alcohol use No     Allergies    Lisinopril   Review of Systems Review of Systems  Respiratory: Negative for shortness of breath.   Cardiovascular: Negative for chest pain.  Gastrointestinal: Negative for abdominal pain.  Neurological: Negative for headaches.  All other systems reviewed and are negative.    Physical Exam Updated Vital Signs BP 114/84   Pulse 84   Temp 98.4 F (36.9 C) (Oral)   Resp 18   Ht 6\' 3"  (1.905 m)   Wt 153 lb (69.4 kg)   SpO2 100%   BMI 19.12 kg/m   Physical Exam  Constitutional: He is oriented to person, place, and time. He appears well-developed and well-nourished. No distress.  HENT:  Head: Normocephalic. Head is with laceration (lateral to right eye 1.5 cm).  Nose: Nose normal.  Eyes: Conjunctivae are normal.  Neck: Neck supple. No tracheal deviation present.  Cardiovascular: Normal rate and regular rhythm.   Pulmonary/Chest: Effort normal. No respiratory distress.  Abdominal: Soft. He exhibits no distension.  Musculoskeletal:       Right elbow: He exhibits decreased range of motion (limited extension 2/2 effusion and pain) and effusion. He exhibits no deformity and no laceration.       Left hand: He exhibits laceration (superficial skin tears of dorsal hand).  Neurological: He is alert and oriented to person, place, and time.  Skin: Skin is warm and dry.  Psychiatric: He has a normal mood and affect.     ED Treatments / Results  Labs (all labs ordered are listed, but only abnormal results are displayed) Labs Reviewed  CBC WITH DIFFERENTIAL/PLATELET - Abnormal; Notable for the following:       Result Value   RBC 3.89 (*)    Hemoglobin 11.5 (*)    HCT 35.9 (*)    RDW 17.2 (*)    Platelets 65 (*)    All other components within normal limits  BASIC METABOLIC PANEL - Abnormal; Notable for the following:    Potassium 5.4 (*)    Glucose, Bld 144 (*)    BUN 30 (*)    Creatinine, Ser 2.02 (*)    Calcium 8.4 (*)    GFR calc non Af Amer 32 (*)    GFR calc Af Amer  37 (*)    Anion gap <3 (*)    All other components within normal limits    EKG  EKG Interpretation None       Radiology Dg Elbow Complete Right  Result Date: 05/17/2016 CLINICAL DATA:  Fall 6 hrs ago, some rt elbow discomfort. EXAM: RIGHT ELBOW - COMPLETE 3+ VIEW COMPARISON:  None. FINDINGS: Abnormal elbow joint effusion noted with anterior sail sign. Supinator fat pad indistinct. There is some slight irregularity of the radial head which could be from subtle fracture or spur. Suspected spurring of the coronoid process. Arterial vascular calcifications noted. IMPRESSION: 1. Abnormal exam with elbow joint effusion and effaced supinator fat pad. Questionable radial head fracture. Consider CT to better characterize given the significant possibility of occult fracture. 2. Atherosclerotic vascular calcifications. Electronically Signed   By: Van Clines M.D.   On: 05/17/2016 16:32   Ct Head Wo Contrast  Result Date: 05/17/2016 CLINICAL DATA:  71 year old male with acute fall and head injury. Patient on Xarelto. Initial encounter. EXAM: CT HEAD WITHOUT CONTRAST TECHNIQUE: Contiguous axial images were obtained from the base of the skull through the vertex without intravenous contrast. COMPARISON:  None. FINDINGS: Brain: No evidence of acute infarction, hemorrhage, hydrocephalus, extra-axial collection or mass lesion/mass effect. Mild generalized cerebral and cerebellar volume loss and mild chronic small-vessel white matter ischemic changes identified. Vascular: Intracranial atherosclerotic calcifications noted. Skull: Normal. Negative for fracture or focal lesion. Sinuses/Orbits: No acute finding. Other: Mild right facial soft tissue swelling noted. IMPRESSION: No evidence of acute intracranial abnormality. Mild right facial soft tissue swelling without evidence of fracture Mild atrophy and chronic small-vessel white matter ischemic changes. Electronically Signed   By: Margarette Canada M.D.   On:  05/17/2016 16:12    Procedures Procedures (including critical care time)  LACERATION REPAIR Performed by: Leo Grosser Authorized by: Leo Grosser Consent: Verbal consent obtained. Risks and benefits: risks, benefits and alternatives were discussed Consent given by: patient Patient identity confirmed: provided demographic data Prepped and Draped in normal sterile fashion Wound explored  Laceration Location: right forehead lateral to eye  Laceration Length: 1.5cm  No Foreign Bodies seen or palpated  Anesthesia: local infiltration  Local anesthetic: n/a Irrigation method: syringe Amount of cleaning: standard  Skin closure: glue  Number of sutures: n/a  Technique: simple  Patient tolerance: Patient tolerated the procedure well with no immediate complications.  Medications Ordered in ED Medications - No data to display   Initial Impression / Assessment and Plan / ED Course  I have reviewed the triage vital signs and the nursing notes.  Pertinent labs & imaging results that were available during  my care of the patient were reviewed by me and considered in my medical decision making (see chart for details).     71 y.o. male presents with fall from standing today striking the right side of his face lateral to his right eye and sustaining left dorsal hand skin tearing. He fell to his right elbow and has joint effusion there with presumed hemarthrosis as Pt is anticoagulated on xarelto. XR and PE findings are c/w radial head fx on right, discussed earlyu mobility as definitive management and sports med f/u offered. Labs drawn appear at baseline. No ICH or other concerning findings on CT head. Laceration repaired with glue applied to skin tears too and discussed wound care. Pt ambulated at baseline and discharge with return precautions discussed. Has multiple years of ongoing chronic diarrhea and has had extensive workup with multiple therapies but has naver tried stool bulking  agents/fiber so will trial this pending routine f/u.  Final Clinical Impressions(s) / ED Diagnoses   Final diagnoses:  Fall from standing, initial encounter  Facial laceration, initial encounter  Skin tear of hand without complication, left, initial encounter  Hemarthrosis, right elbow  Closed nondisplaced fracture of head of right radius, initial encounter  Chronic diarrhea    New Prescriptions New Prescriptions   No medications on file     Leo Grosser, MD 05/18/16 [phone]

## 2016-05-17 NOTE — ED Provider Notes (Signed)
Received care of patient from Dr. Laneta Simmers. Please see his note for prior history and physical. Briefly this is a 71 year old male with history of atrial fibrillation, CHF, diabetes, renal transplant who presented with concern for fall. Patient also reports chronic diarrhea. CT head showed no acute findings. Radial head fracture noted, and follow-up and recommendations discussed with Dr. Laneta Simmers and patient. Labs pending at time of my assumption of care.   Labs show very mild hyperkalemia of 5.4. We'll give patient one dose of Kayexalate, recommend follow-up with his PCP regarding recheck. He is noted to have similarly elevated levels in care everywhere, as well as similar creatinine. Discussed all results with patient and recommend continued outpt follow up. Patient discharged in stable condition with understanding of reasons to return.    Gareth Morgan, MD 05/19/16 [phone]

## 2016-05-20 ENCOUNTER — Ambulatory Visit (INDEPENDENT_AMBULATORY_CARE_PROVIDER_SITE_OTHER): Payer: Medicare Other | Admitting: Family Medicine

## 2016-05-20 ENCOUNTER — Ambulatory Visit (HOSPITAL_BASED_OUTPATIENT_CLINIC_OR_DEPARTMENT_OTHER)
Admission: RE | Admit: 2016-05-20 | Discharge: 2016-05-20 | Disposition: A | Discharge status: Home / Self Care | Payer: Medicare Other | Source: Ambulatory Visit | Attending: Family Medicine | Admitting: Family Medicine

## 2016-05-20 ENCOUNTER — Encounter: Payer: Self-pay | Admitting: Family Medicine

## 2016-05-20 VITALS — BP 125/84 | HR 120 | Ht 75.0 in | Wt 150.0 lb

## 2016-05-20 DIAGNOSIS — M19011 Primary osteoarthritis, right shoulder: Secondary | ICD-10-CM | POA: Insufficient documentation

## 2016-05-20 DIAGNOSIS — S42114A Nondisplaced fracture of body of scapula, right shoulder, initial encounter for closed fracture: Secondary | ICD-10-CM | POA: Diagnosis not present

## 2016-05-20 DIAGNOSIS — S42111A Displaced fracture of body of scapula, right shoulder, initial encounter for closed fracture: Secondary | ICD-10-CM | POA: Insufficient documentation

## 2016-05-20 DIAGNOSIS — S4991XA Unspecified injury of right shoulder and upper arm, initial encounter: Secondary | ICD-10-CM | POA: Diagnosis present

## 2016-05-20 DIAGNOSIS — W19XXXA Unspecified fall, initial encounter: Secondary | ICD-10-CM | POA: Diagnosis not present

## 2016-05-20 DIAGNOSIS — S59901A Unspecified injury of right elbow, initial encounter: Secondary | ICD-10-CM | POA: Diagnosis not present

## 2016-05-20 DIAGNOSIS — S59901D Unspecified injury of right elbow, subsequent encounter: Secondary | ICD-10-CM | POA: Insufficient documentation

## 2016-05-20 DIAGNOSIS — S42191A Fracture of other part of scapula, right shoulder, initial encounter for closed fracture: Secondary | ICD-10-CM | POA: Diagnosis not present

## 2016-05-20 NOTE — Patient Instructions (Signed)
You have an effusion (blood, fluid) in your elbow joint but the x-rays do not show a fracture. You either badly bruised the joint to cause this or have a subtle fracture. Regardless they're treated similarly - ice only if needed, tylenol 500mg  1-2 tabs three times a day if needed for pain. Do motion exercises as I showed you to regain the extension. Follow up with me in 1 1/2 weeks for reevaluation.  On one view of your scapular x-rays you appear to have a small fracture of the body of the scapula (shoulder blade). Treatment is as noted above. You don't need a cast, sling or anything at this point. Icing as needed, tylenol.

## 2016-05-20 NOTE — Assessment & Plan Note (Signed)
independently reviewed radiographs showing this on lateral view.  No involvement of glenoid.  Reassured patient. Expect 6-8 weeks to resolve.  Icing, tylenol as noted above.  F/u in 1 1/2 weeks.

## 2016-05-20 NOTE — Assessment & Plan Note (Signed)
independently reviewed radiographs and he does have an effusion.  Possible he has a nondisplaced radial head fracture but radiographs only show spurring.  Clinically doing very well.  Shown motion exercises to regain full extensions.  Tylenol, icing only if needed.  F/u in 1 1/2 weeks to reevaluate.

## 2016-05-20 NOTE — Progress Notes (Signed)
PCP: Lamar Blinks MD  Subjective:   HPI: Patient is a 71 y.o. male here for right elbow injury.  Patient reports he was at church on 1/28 when he tripped over a chair and fell down onto right side. Put his hands out to brace himself. Scraped up left hand - glued lacerations together in ED. Also with black eye on right. No loss of consciousness. Has pain at 3/10 level right elbow worse when trying to extend elbow, dull. Also with pain right scapular area - feels if he pushes on right pectoral region. Is right handed. Some swelling bruising of right elbow. No numbness, other skin changes.  Past Medical History:  Diagnosis Date  . A-fib ([place])   . CHF (congestive heart failure) ([place])   . Chronic diarrhea   . Diabetes mellitus without complication (HCC)    borderline  . History of ITP   . Hypertension   . Pancreatitis   . Renal disorder     Current Outpatient Prescriptions on File Prior to Visit  Medication Sig Dispense Refill  . oxyCODONE (OXY IR/ROXICODONE) 5 MG immediate release tablet Take 1 tablet (5 mg total) by mouth every 4 (four) hours as needed for severe pain. 10 tablet 0  . pantoprazole (PROTONIX) 40 MG tablet Take 40 mg by mouth daily.    . polycarbophil (FIBERCON) 625 MG tablet Take 2 tablets (1,250 mg total) by mouth daily. 30 tablet 0  . prednisoLONE 5 MG TABS tablet Take by mouth.    . rivaroxaban (XARELTO) 20 MG TABS tablet Take 20 mg by mouth daily with supper.    . tacrolimus (PROGRAF) 1 MG capsule Take 1 mg by mouth 2 (two) times daily.     No current facility-administered medications on file prior to visit.     Past Surgical History:  Procedure Laterality Date  . ABDOMINAL SURGERY    . CHOLECYSTECTOMY    . KIDNEY TRANSPLANT      Allergies  Allergen Reactions  . Atorvastatin Itching    Incessant itching which stopped when he stopped the atorvastatin.  Marland Kitchen Lisinopril Other (See Comments)    Causes pancreatitis     Social History   Social  History  . Marital status: Married    Spouse name: N/A  . Number of children: N/A  . Years of education: N/A   Occupational History  . Not on file.   Social History Main Topics  . Smoking status: Never Smoker  . Smokeless tobacco: Never Used  . Alcohol use No  . Drug use: No  . Sexual activity: Not on file   Other Topics Concern  . Not on file   Social History Narrative  . No narrative on file    No family history on file.  BP 125/84   Pulse (!) 120   Ht 6\' 3"  (1.905 m)   Wt 150 lb (68 kg)   BMI 18.75 kg/m   Review of Systems: See HPI above.     Objective:  Physical Exam:  Gen: NAD, comfortable in exam room  Right shoulder: Diffuse muscle atrophy.  No swelling, ecchymoses.  No gross deformity. TTP right scapular body.  No other tenderness about shoulder. FROM without pain. Negative Hawkins, Neers. Negative Yergasons. Strength 5/5 with empty can and resisted internal/external rotation. Negative apprehension. NV intact distally.  Right elbow: Mild swelling, bruising posterior elbow.  No other deformity. No TTP except superficially over bruise.  No radial head, other tenderness. Collateral ligaments intact. Full flexion -  lacks about 10 degrees extension. NVI distally.  Assessment & Plan:  1. Right elbow injury - independently reviewed radiographs and he does have an effusion.  Possible he has a nondisplaced radial head fracture but radiographs only show spurring.  Clinically doing very well.  Shown motion exercises to regain full extensions.  Tylenol, icing only if needed.  F/u in 1 1/2 weeks to reevaluate.  2. Right scapular body fracture - independently reviewed radiographs showing this on lateral view.  No involvement of glenoid.  Reassured patient. Expect 6-8 weeks to resolve.  Icing, tylenol as noted above.  F/u in 1 1/2 weeks.

## 2016-05-22 DIAGNOSIS — Z940 Kidney transplant status: Secondary | ICD-10-CM | POA: Diagnosis not present

## 2016-05-22 DIAGNOSIS — Z79899 Other long term (current) drug therapy: Secondary | ICD-10-CM | POA: Diagnosis not present

## 2016-05-22 DIAGNOSIS — K529 Noninfective gastroenteritis and colitis, unspecified: Secondary | ICD-10-CM | POA: Diagnosis not present

## 2016-05-22 DIAGNOSIS — Z5181 Encounter for therapeutic drug level monitoring: Secondary | ICD-10-CM | POA: Diagnosis not present

## 2016-05-22 DIAGNOSIS — K863 Pseudocyst of pancreas: Secondary | ICD-10-CM | POA: Diagnosis not present

## 2016-05-22 DIAGNOSIS — I864 Gastric varices: Secondary | ICD-10-CM | POA: Diagnosis not present

## 2016-05-22 DIAGNOSIS — D899 Disorder involving the immune mechanism, unspecified: Secondary | ICD-10-CM | POA: Diagnosis not present

## 2016-05-27 DIAGNOSIS — L03115 Cellulitis of right lower limb: Secondary | ICD-10-CM | POA: Diagnosis not present

## 2016-05-27 DIAGNOSIS — L97512 Non-pressure chronic ulcer of other part of right foot with fat layer exposed: Secondary | ICD-10-CM | POA: Diagnosis not present

## 2016-05-28 DIAGNOSIS — K529 Noninfective gastroenteritis and colitis, unspecified: Secondary | ICD-10-CM | POA: Diagnosis not present

## 2016-06-01 ENCOUNTER — Ambulatory Visit: Payer: Medicare Other | Admitting: Family Medicine

## 2016-06-01 ENCOUNTER — Ambulatory Visit (INDEPENDENT_AMBULATORY_CARE_PROVIDER_SITE_OTHER): Payer: Medicare Other | Admitting: Family Medicine

## 2016-06-01 ENCOUNTER — Encounter: Payer: Self-pay | Admitting: Family Medicine

## 2016-06-01 DIAGNOSIS — S59901D Unspecified injury of right elbow, subsequent encounter: Secondary | ICD-10-CM | POA: Diagnosis not present

## 2016-06-01 DIAGNOSIS — S42114D Nondisplaced fracture of body of scapula, right shoulder, subsequent encounter for fracture with routine healing: Secondary | ICD-10-CM

## 2016-06-01 DIAGNOSIS — K863 Pseudocyst of pancreas: Secondary | ICD-10-CM | POA: Diagnosis not present

## 2016-06-01 NOTE — Patient Instructions (Signed)
Follow up with me in 4 weeks or as needed. Call me if you have any questions.

## 2016-06-02 DIAGNOSIS — L97511 Non-pressure chronic ulcer of other part of right foot limited to breakdown of skin: Secondary | ICD-10-CM | POA: Diagnosis not present

## 2016-06-02 NOTE — Assessment & Plan Note (Signed)
seen on lateral view.  Surprisingly very little pain now.  Tylenol, icing only if needed.  F/u in 4 weeks or prn.

## 2016-06-02 NOTE — Progress Notes (Signed)
PCP: Lamar Blinks MD  Subjective:   HPI: Patient is a 71 y.o. male here for right elbow injury.  1/31: Patient reports he was at church on 1/28 when he tripped over a chair and fell down onto right side. Put his hands out to brace himself. Scraped up left hand - glued lacerations together in ED. Also with black eye on right. No loss of consciousness. Has pain at 3/10 level right elbow worse when trying to extend elbow, dull. Also with pain right scapular area - feels if he pushes on right pectoral region. Is right handed. Some swelling bruising of right elbow. No numbness, other skin changes.  2/12: Patient reports he is doing well. Currently has no pain. With certain movements can feel some very slight soreness in right shoulder blade. Has almost full motion of elbow now with minimal discomfort on full extension. Not taking anything for pain. No skin changes, swelling, numbness.  Past Medical History:  Diagnosis Date  . A-fib ([place])   . CHF (congestive heart failure) ([place])   . Chronic diarrhea   . Diabetes mellitus without complication (HCC)    borderline  . History of ITP   . Hypertension   . Pancreatitis   . Renal disorder     Current Outpatient Prescriptions on File Prior to Visit  Medication Sig Dispense Refill  . diphenoxylate-atropine (LOMOTIL) 2.5-0.025 MG tablet TK 1 T PO TID PRF DH  2  . finasteride (PROSCAR) 5 MG tablet     . metoprolol (TOPROL-XL) 200 MG 24 hr tablet     . oxyCODONE (OXY IR/ROXICODONE) 5 MG immediate release tablet Take 1 tablet (5 mg total) by mouth every 4 (four) hours as needed for severe pain. 10 tablet 0  . pantoprazole (PROTONIX) 40 MG tablet Take 40 mg by mouth daily.    . polycarbophil (FIBERCON) 625 MG tablet Take 2 tablets (1,250 mg total) by mouth daily. 30 tablet 0  . prednisoLONE 5 MG TABS tablet Take by mouth.    . rivaroxaban (XARELTO) 20 MG TABS tablet Take 20 mg by mouth daily with supper.    . tacrolimus (PROGRAF) 1 MG  capsule Take 1 mg by mouth 2 (two) times daily.    Marland Kitchen ZENPEP 25000 units CPEP      No current facility-administered medications on file prior to visit.     Past Surgical History:  Procedure Laterality Date  . ABDOMINAL SURGERY    . CHOLECYSTECTOMY    . KIDNEY TRANSPLANT      Allergies  Allergen Reactions  . Atorvastatin Itching    Incessant itching which stopped when he stopped the atorvastatin.  Marland Kitchen Lisinopril Other (See Comments)    Causes pancreatitis     Social History   Social History  . Marital status: Married    Spouse name: N/A  . Number of children: N/A  . Years of education: N/A   Occupational History  . Not on file.   Social History Main Topics  . Smoking status: Never Smoker  . Smokeless tobacco: Never Used  . Alcohol use No  . Drug use: No  . Sexual activity: Not on file   Other Topics Concern  . Not on file   Social History Narrative  . No narrative on file    No family history on file.  BP 118/69   Pulse (!) 121   Ht 6\' 3"  (1.905 m)   Wt 150 lb (68 kg)   BMI 18.75 kg/m   Review  of Systems: See HPI above.     Objective:  Physical Exam:  Gen: NAD, comfortable in exam room  Right shoulder: Diffuse muscle atrophy.  No swelling, ecchymoses.  No gross deformity. No TTP right scapular body.  No other tenderness about shoulder. FROM without pain. Negative Hawkins, Neers. Strength 5/5 with empty can and resisted internal/external rotation. NV intact distally.  Right elbow: No swelling, bruising, other deformity. No TTP.  No radial head, other tenderness. Collateral ligaments intact. Lacks only a few degrees of extension now.  Full flexion. NVI distally.  Assessment & Plan:  1. Right elbow injury - Effusion noted on prior radiographs. No tenderness and has almost fully regained motion.  Consistent with contusion.  Tylenol, icing only if needed.  F/u in 4 weeks or prn.  2. Right scapular body fracture - seen on lateral view.   Surprisingly very little pain now.  Tylenol, icing only if needed.  F/u in 4 weeks or prn.

## 2016-06-02 NOTE — Assessment & Plan Note (Signed)
Effusion noted on prior radiographs. No tenderness and has almost fully regained motion.  Consistent with contusion.  Tylenol, icing only if needed.  F/u in 4 weeks or prn.

## 2016-06-03 DIAGNOSIS — D899 Disorder involving the immune mechanism, unspecified: Secondary | ICD-10-CM | POA: Diagnosis not present

## 2016-06-03 DIAGNOSIS — Z4822 Encounter for aftercare following kidney transplant: Secondary | ICD-10-CM | POA: Diagnosis not present

## 2016-06-08 DIAGNOSIS — Z940 Kidney transplant status: Secondary | ICD-10-CM | POA: Diagnosis not present

## 2016-06-08 DIAGNOSIS — I5042 Chronic combined systolic (congestive) and diastolic (congestive) heart failure: Secondary | ICD-10-CM | POA: Diagnosis not present

## 2016-06-08 DIAGNOSIS — I2510 Atherosclerotic heart disease of native coronary artery without angina pectoris: Secondary | ICD-10-CM | POA: Diagnosis not present

## 2016-06-08 DIAGNOSIS — I482 Chronic atrial fibrillation: Secondary | ICD-10-CM | POA: Diagnosis not present

## 2016-06-08 DIAGNOSIS — I10 Essential (primary) hypertension: Secondary | ICD-10-CM | POA: Diagnosis not present

## 2016-06-08 DIAGNOSIS — I428 Other cardiomyopathies: Secondary | ICD-10-CM | POA: Diagnosis not present

## 2016-06-10 DIAGNOSIS — Z940 Kidney transplant status: Secondary | ICD-10-CM | POA: Diagnosis not present

## 2016-06-16 DIAGNOSIS — E1142 Type 2 diabetes mellitus with diabetic polyneuropathy: Secondary | ICD-10-CM | POA: Diagnosis not present

## 2016-06-16 DIAGNOSIS — M7741 Metatarsalgia, right foot: Secondary | ICD-10-CM | POA: Diagnosis not present

## 2016-06-19 ENCOUNTER — Telehealth: Payer: Self-pay

## 2016-06-22 ENCOUNTER — Ambulatory Visit (INDEPENDENT_AMBULATORY_CARE_PROVIDER_SITE_OTHER): Payer: Medicare Other | Admitting: Family Medicine

## 2016-06-22 ENCOUNTER — Encounter: Payer: Self-pay | Admitting: Family Medicine

## 2016-06-22 VITALS — BP 120/82 | HR 84 | Temp 97.5°F | Ht 75.0 in | Wt 162.6 lb

## 2016-06-22 DIAGNOSIS — I10 Essential (primary) hypertension: Secondary | ICD-10-CM

## 2016-06-22 DIAGNOSIS — J300 Vasomotor rhinitis: Secondary | ICD-10-CM | POA: Diagnosis not present

## 2016-06-22 DIAGNOSIS — E118 Type 2 diabetes mellitus with unspecified complications: Secondary | ICD-10-CM | POA: Diagnosis not present

## 2016-06-22 DIAGNOSIS — I482 Chronic atrial fibrillation, unspecified: Secondary | ICD-10-CM

## 2016-06-22 DIAGNOSIS — Z23 Encounter for immunization: Secondary | ICD-10-CM

## 2016-06-22 DIAGNOSIS — Z940 Kidney transplant status: Secondary | ICD-10-CM | POA: Diagnosis not present

## 2016-06-22 MED ORDER — IPRATROPIUM BROMIDE 0.03 % NA SOLN: 2.0000 | Freq: Four times a day (QID) | NASAL | 6 refills | Status: DC

## 2016-06-22 NOTE — Progress Notes (Signed)
Pre visit review using our clinic tool,if applicable. No additional management support is needed unless otherwise documented below in the visit note.  

## 2016-06-22 NOTE — Patient Instructions (Addendum)
Please keep your glucose meter on hand and check your sugars on occasion Let's have you come in for a lab visit only / A1c in 2-3 months The best thing you can probably do for your skin is to keep it moisturized and protected with a thick moisturizer such as cepaphil  [facility] may be a good choice for you nearby  Address: [address] [place], [place] [address]

## 2016-06-22 NOTE — Progress Notes (Signed)
[place] at [facility] [address], [place], [place] [address] [phone]  Date:  06/22/2016   Name:  Matthew Rowland   DOB:  12/29/1945   MRN:  [id number]  PCP:  Lamar Blinks, MD    Chief Complaint: Establish Care   History of Present Illness:  LAEL PILCH is a 71 y.o. very pleasant male patient who presents with the following:  Here today as a new patient.   His cardiologist, GI, nephology are all at [facility]. He needs a PCP to help him in case he were to get sick, and with other more general needs He was born in [place], he had been living in [place] until last fall.  Moved to this area in October of last year with his wife to be closer to family. He has a complex PMHx including atrial fib, CAD, CHF, HTN, CKD s/p kidney transplant and past history of pancreatitis   He thinks he first developed his kidney problmes when he had pancreatitis and then renal failure back in 2004.  He eventually had a transplant in 2014; he got the kidney from his wife.   The kidney is doing pretty well but he does not have a normal creat He has chronic low platelets- this also appeared after he had the pancreatitis.  They think this maybe due to his splenomegaly.  Has never been severe enough to cause him any problems  He was first dx with a fib in 2009 he thinks.  In 2013 he had an ablation for same; while still in the hospital he developed CP and underwent a cath. He was found to have a serious blockage and underwent a stent.   He had another ablation that also did not work and now just remains in a fib He is generally in a fib, and his rate is usually around 100 per his report His most recent echo was in December 20170 EF 40% He is on xarelto and troprol He does not have any spontaneous bleeding, but he will bleed and bruise easily between his prednisone and xarelto  He continues to have some issues with his pancreatitis.  Reports that he has  seen a lot of good doctors both here and in [place], and no one has been able to give him a reason for his diarrhea.  He is better on lomotil and pancreatic enzymes- he takes the zenpep TID, 3 pills each dose.    He has a history of borderline DM- he is not on any medication for same at this time.  He lost a lot of weight when he was so ill, which is good in that he no longer requires insulin His A1c was checked recently and it ws approx 7%. He was on insulin and had a pump actually in the past  He does not check his blood sugar at home anymore.  Does not have a working meter.  Would like to get one He finds that many healthy foods will irritate his stomach.  He does best with easier to digest foods but these are often cookies, etc  He notes he has a persistent nasal drip.  He has noticed this for 2-3 years.    Also, prior to leaving [place] he was doing some traveling, and started to itch. He used some gold bond and got better. He has not noted any real differnce when he takes a shower/ hot water exposure   He does  have BPH but this is not severe.  He would like to try atrovent nasal for his rhinorrhea which can be embarrassing.  He notes that his nose will often drip and people will think he is ill, he feels self conscious about having to wife his noseHe understands that if his urinary sx worsen he should stop this medication  I have reviewed notes from [place] system.  History is supplemented by his wife who is at the visit  He has not had a flu shot this year and would like to have one. He knows that some of his medications (prednisolone, prograf) may lessen the shot's efficacy   Patient Active Problem List   Diagnosis Date Noted  . Elbow injury, right, subsequent encounter 05/20/2016  . Fracture of right scapular body 05/20/2016  . Chronic combined systolic and diastolic CHF (congestive heart failure) ([place]) 03/29/2016  . Cardiomyopathy ([place]) 03/23/2016  . 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 Essential hypertension 01/15/2016  . H/O acute pancreatitis 01/15/2016  . Immunosuppression ([place]) 01/15/2016  . Type 2 diabetes mellitus with circulatory disorder ([place]) 01/15/2016  . Hx of kidney transplant 01/04/2016  . Folate deficiency 12/08/2015  . Pancytopenia ([place]) 12/08/2015  . History of pancreatitis 10/11/2015  . CKD (chronic kidney disease) 01/03/2015  . Atrial fibrillation, persistent ([place]) 11/19/2014  . SSS (sick sinus syndrome) ([place]) 02/09/2014  . CAD (coronary artery disease) 02/21/2013  . ESRD (end stage renal disease) ([place]) 08/11/2012    Past Medical History:  Diagnosis Date  . A-fib ([place])   . CHF (congestive heart failure) ([place])   . Chronic diarrhea   . Diabetes mellitus without complication (HCC)    borderline  . History of ITP   . Hypertension   . Pancreatitis   . Renal disorder     Past Surgical History:  Procedure Laterality Date  . ABDOMINAL SURGERY    . CHOLECYSTECTOMY    . KIDNEY TRANSPLANT      Social History  Substance Use Topics  . Smoking status: Never Smoker  . Smokeless tobacco: Never Used  . Alcohol use No    No family history on file.  Allergies  Allergen Reactions  . Atorvastatin Itching    Incessant itching which stopped when he stopped the atorvastatin.  Marland Kitchen Lisinopril Other (See Comments)    Causes pancreatitis     Medication list has been reviewed and updated.  Current Outpatient Prescriptions on File Prior to Visit  Medication Sig Dispense Refill  . diphenoxylate-atropine (LOMOTIL) 2.5-0.025 MG tablet TK 1 T PO TID PRF DH  2  . finasteride (PROSCAR) 5 MG tablet     . metoprolol (TOPROL-XL) 200 MG 24 hr tablet     . pantoprazole (PROTONIX) 40 MG tablet Take 40 mg by mouth daily.    . polycarbophil (FIBERCON) 625 MG tablet Take 2 tablets (1,250 mg total) by mouth daily. 30 tablet 0  . prednisoLONE 5 MG TABS tablet Take by mouth.    . rivaroxaban (XARELTO) 20 MG TABS tablet Take 20  mg by mouth daily with supper.    . tacrolimus (PROGRAF) 1 MG capsule Take 1 mg by mouth 2 (two) times daily.    Marland Kitchen ZENPEP 25000 units CPEP      No current facility-administered medications on file prior to visit.     Review of Systems:  As per HPI- otherwise negative. No fever or chills No rash No symptoms of hypo or hyperglycemia No spontaneous bleeding  Positive for easy bruising    Physical Examination: Vitals:   06/22/16 1508  BP: 120/82  Pulse: 84  Temp: 97.5 F (36.4 C)   Vitals:   06/22/16 1508  Weight: 162 lb 9.6 oz (73.8 kg)  Height: 6\' 3"  (1.905 m)   Body mass index is 20.32 kg/m. Ideal Body Weight: Weight in (lb) to have BMI = 25: 199.6  GEN: WDWN, NAD, Non-toxic, A & O x 3, looks well HEENT: Atraumatic, Normocephalic. Neck supple. No masses, No LAD.  Bilateral TM wnl, oropharynx normal.  PEERL,EOMI.   Ears and Nose: No external deformity. XQ:JJHERD fib with rate under 100, No M/G/R. No JVD. No thrill. No extra heart sounds. PULM: CTA B, no wheezes, crackles, rhonchi. No retractions. No resp. distress. No accessory muscle use. ABD: S, NT, ND EXTR: No c/c/e NEURO Normal gait.  PSYCH: Normally interactive. Conversant. Not depressed or anxious appearing.  Calm demeanor.    Assessment and Plan: Chronic vasomotor rhinitis - Plan: ipratropium (ATROVENT) 0.03 % nasal spray, DISCONTINUED: ipratropium (ATROVENT) 0.03 % nasal spray  Controlled type 2 diabetes mellitus with complication, without long-term current use of insulin (HCC) - Plan: Hemoglobin A1c  Encounter for immunization - Plan: Flu Vaccine QUAD 36+ mos IM  History of kidney transplant  Chronic atrial fibrillation (HCC)  Hypertension, unspecified type  Here today to establish care and discuss a few issues He does have a history of IDDM and was on a pump in the past.  However he does not currently use any treatment for DM and a recent A1c was ok.  Gave him a glucose meter and he will start  checking his glucose on occasion.  Plan to repeat an A1c in 32 months Flu shot today Trial of atrovent nasal for his rhinitis sx- he will stop use if any side effects BP is controlled He will continue to see her various specialists I will follow-up with him pending his upcoming A1c level   Signed Lamar Blinks, MD

## 2016-06-23 NOTE — Telephone Encounter (Signed)
Pre visit call completed 

## 2016-06-24 DIAGNOSIS — K529 Noninfective gastroenteritis and colitis, unspecified: Secondary | ICD-10-CM | POA: Diagnosis not present

## 2016-06-24 DIAGNOSIS — Z940 Kidney transplant status: Secondary | ICD-10-CM | POA: Diagnosis not present

## 2016-07-01 DIAGNOSIS — Z940 Kidney transplant status: Secondary | ICD-10-CM | POA: Diagnosis not present

## 2016-07-21 DIAGNOSIS — Z9225 Personal history of immunosupression therapy: Secondary | ICD-10-CM | POA: Diagnosis not present

## 2016-07-21 DIAGNOSIS — K529 Noninfective gastroenteritis and colitis, unspecified: Secondary | ICD-10-CM | POA: Diagnosis not present

## 2016-07-21 DIAGNOSIS — Z940 Kidney transplant status: Secondary | ICD-10-CM | POA: Diagnosis not present

## 2016-07-21 DIAGNOSIS — A0811 Acute gastroenteropathy due to Norwalk agent: Secondary | ICD-10-CM | POA: Diagnosis not present

## 2016-07-29 DIAGNOSIS — D899 Disorder involving the immune mechanism, unspecified: Secondary | ICD-10-CM | POA: Diagnosis not present

## 2016-07-29 DIAGNOSIS — Z940 Kidney transplant status: Secondary | ICD-10-CM | POA: Diagnosis not present

## 2016-08-14 DIAGNOSIS — R29898 Other symptoms and signs involving the musculoskeletal system: Secondary | ICD-10-CM | POA: Diagnosis not present

## 2016-08-14 DIAGNOSIS — L97511 Non-pressure chronic ulcer of other part of right foot limited to breakdown of skin: Secondary | ICD-10-CM | POA: Diagnosis not present

## 2016-08-24 DIAGNOSIS — I5042 Chronic combined systolic (congestive) and diastolic (congestive) heart failure: Secondary | ICD-10-CM | POA: Diagnosis not present

## 2016-08-24 DIAGNOSIS — I482 Chronic atrial fibrillation: Secondary | ICD-10-CM | POA: Diagnosis not present

## 2016-08-24 DIAGNOSIS — I10 Essential (primary) hypertension: Secondary | ICD-10-CM | POA: Diagnosis not present

## 2016-08-27 DIAGNOSIS — R799 Abnormal finding of blood chemistry, unspecified: Secondary | ICD-10-CM | POA: Diagnosis not present

## 2016-08-27 DIAGNOSIS — I482 Chronic atrial fibrillation: Secondary | ICD-10-CM | POA: Diagnosis not present

## 2016-08-27 DIAGNOSIS — K863 Pseudocyst of pancreas: Secondary | ICD-10-CM | POA: Diagnosis not present

## 2016-08-27 DIAGNOSIS — E1159 Type 2 diabetes mellitus with other circulatory complications: Secondary | ICD-10-CM | POA: Diagnosis not present

## 2016-08-27 DIAGNOSIS — Z79899 Other long term (current) drug therapy: Secondary | ICD-10-CM | POA: Diagnosis not present

## 2016-08-27 DIAGNOSIS — Z4822 Encounter for aftercare following kidney transplant: Secondary | ICD-10-CM | POA: Diagnosis not present

## 2016-08-27 DIAGNOSIS — I10 Essential (primary) hypertension: Secondary | ICD-10-CM | POA: Diagnosis not present

## 2016-08-27 DIAGNOSIS — D899 Disorder involving the immune mechanism, unspecified: Secondary | ICD-10-CM | POA: Diagnosis not present

## 2016-08-27 DIAGNOSIS — K529 Noninfective gastroenteritis and colitis, unspecified: Secondary | ICD-10-CM | POA: Diagnosis not present

## 2016-08-27 DIAGNOSIS — Z940 Kidney transplant status: Secondary | ICD-10-CM | POA: Diagnosis not present

## 2016-09-08 DIAGNOSIS — L97511 Non-pressure chronic ulcer of other part of right foot limited to breakdown of skin: Secondary | ICD-10-CM | POA: Diagnosis not present

## 2016-09-21 DIAGNOSIS — D899 Disorder involving the immune mechanism, unspecified: Secondary | ICD-10-CM | POA: Diagnosis not present

## 2016-09-21 DIAGNOSIS — Z940 Kidney transplant status: Secondary | ICD-10-CM | POA: Diagnosis not present

## 2016-09-24 DIAGNOSIS — Z940 Kidney transplant status: Secondary | ICD-10-CM | POA: Diagnosis not present

## 2016-10-13 DIAGNOSIS — L97511 Non-pressure chronic ulcer of other part of right foot limited to breakdown of skin: Secondary | ICD-10-CM | POA: Diagnosis not present

## 2016-10-20 DIAGNOSIS — D899 Disorder involving the immune mechanism, unspecified: Secondary | ICD-10-CM | POA: Diagnosis not present

## 2016-10-20 DIAGNOSIS — Z940 Kidney transplant status: Secondary | ICD-10-CM | POA: Diagnosis not present

## 2016-10-26 ENCOUNTER — Telehealth: Payer: Self-pay | Admitting: Family Medicine

## 2016-10-26 DIAGNOSIS — N3946 Mixed incontinence: Secondary | ICD-10-CM

## 2016-10-26 DIAGNOSIS — H9190 Unspecified hearing loss, unspecified ear: Secondary | ICD-10-CM

## 2016-10-26 NOTE — Telephone Encounter (Signed)
Pt called in to request a referral to a urologist and also optomalogist. Pt would like to be set up with somewhere in [place].    CB: [phone]   Pt says that he have trouble holding his urine until he make it to the restroom and also he's diabetic and have to have annual eye exams.    Please assist pt further.

## 2016-10-26 NOTE — Addendum Note (Signed)
Addended by: Lamar Blinks C on: 10/26/2016 05:14 PM   Modules accepted: Orders

## 2016-10-26 NOTE — Telephone Encounter (Signed)
Called and LMOM- I did referrals to audiologist and to urology. He can simply schedule to see an optometrist at his convenience

## 2016-10-26 NOTE — Telephone Encounter (Signed)
°  Relation to WH:KNZU Call back [phone]   Reason for call:  Patient requesting referral to Dr. Jenny Reichmann May audiologist from [facility] telephone # [phone] for baha implant procedure

## 2016-10-28 ENCOUNTER — Encounter: Payer: Self-pay | Admitting: Family Medicine

## 2016-11-12 DIAGNOSIS — K863 Pseudocyst of pancreas: Secondary | ICD-10-CM | POA: Diagnosis not present

## 2016-11-12 DIAGNOSIS — R161 Splenomegaly, not elsewhere classified: Secondary | ICD-10-CM | POA: Diagnosis not present

## 2016-11-17 DIAGNOSIS — E1142 Type 2 diabetes mellitus with diabetic polyneuropathy: Secondary | ICD-10-CM | POA: Diagnosis not present

## 2016-11-17 DIAGNOSIS — L97511 Non-pressure chronic ulcer of other part of right foot limited to breakdown of skin: Secondary | ICD-10-CM | POA: Diagnosis not present

## 2016-11-17 DIAGNOSIS — R29898 Other symptoms and signs involving the musculoskeletal system: Secondary | ICD-10-CM | POA: Diagnosis not present

## 2016-11-18 DIAGNOSIS — Z940 Kidney transplant status: Secondary | ICD-10-CM | POA: Diagnosis not present

## 2016-11-18 DIAGNOSIS — I482 Chronic atrial fibrillation: Secondary | ICD-10-CM | POA: Diagnosis not present

## 2016-11-18 DIAGNOSIS — K529 Noninfective gastroenteritis and colitis, unspecified: Secondary | ICD-10-CM | POA: Diagnosis not present

## 2016-11-18 DIAGNOSIS — D899 Disorder involving the immune mechanism, unspecified: Secondary | ICD-10-CM | POA: Diagnosis not present

## 2016-11-18 DIAGNOSIS — Z4822 Encounter for aftercare following kidney transplant: Secondary | ICD-10-CM | POA: Diagnosis not present

## 2016-11-18 DIAGNOSIS — K863 Pseudocyst of pancreas: Secondary | ICD-10-CM | POA: Diagnosis not present

## 2016-11-18 DIAGNOSIS — E1159 Type 2 diabetes mellitus with other circulatory complications: Secondary | ICD-10-CM | POA: Diagnosis not present

## 2016-11-18 DIAGNOSIS — I10 Essential (primary) hypertension: Secondary | ICD-10-CM | POA: Diagnosis not present

## 2016-11-30 DIAGNOSIS — K863 Pseudocyst of pancreas: Secondary | ICD-10-CM | POA: Diagnosis not present

## 2016-11-30 DIAGNOSIS — Z8719 Personal history of other diseases of the digestive system: Secondary | ICD-10-CM | POA: Diagnosis not present

## 2016-11-30 DIAGNOSIS — K529 Noninfective gastroenteritis and colitis, unspecified: Secondary | ICD-10-CM | POA: Diagnosis not present

## 2016-12-01 DIAGNOSIS — R3129 Other microscopic hematuria: Secondary | ICD-10-CM | POA: Diagnosis not present

## 2016-12-01 DIAGNOSIS — Z681 Body mass index (BMI) 19 or less, adult: Secondary | ICD-10-CM | POA: Diagnosis not present

## 2016-12-01 DIAGNOSIS — H9190 Unspecified hearing loss, unspecified ear: Secondary | ICD-10-CM | POA: Diagnosis not present

## 2016-12-01 DIAGNOSIS — N3946 Mixed incontinence: Secondary | ICD-10-CM | POA: Diagnosis not present

## 2016-12-01 DIAGNOSIS — N3941 Urge incontinence: Secondary | ICD-10-CM | POA: Diagnosis not present

## 2016-12-16 DIAGNOSIS — Z940 Kidney transplant status: Secondary | ICD-10-CM | POA: Diagnosis not present

## 2016-12-16 DIAGNOSIS — D899 Disorder involving the immune mechanism, unspecified: Secondary | ICD-10-CM | POA: Diagnosis not present

## 2016-12-24 ENCOUNTER — Ambulatory Visit (INDEPENDENT_AMBULATORY_CARE_PROVIDER_SITE_OTHER): Payer: Medicare Other | Admitting: Family Medicine

## 2016-12-24 VITALS — BP 105/75 | HR 85 | Temp 98.2°F | Ht 75.0 in | Wt 153.0 lb

## 2016-12-24 DIAGNOSIS — Z23 Encounter for immunization: Secondary | ICD-10-CM | POA: Diagnosis not present

## 2016-12-24 DIAGNOSIS — E118 Type 2 diabetes mellitus with unspecified complications: Secondary | ICD-10-CM

## 2016-12-24 DIAGNOSIS — I480 Paroxysmal atrial fibrillation: Secondary | ICD-10-CM

## 2016-12-24 LAB — HEMOGLOBIN A1C: Hgb A1c MFr Bld: 10.2 % — ABNORMAL HIGH (ref 4.6–6.5)

## 2016-12-24 MED ORDER — METOPROLOL SUCCINATE ER 100 MG PO TB24: ORAL_TABLET | ORAL | 0 refills | Status: AC

## 2016-12-24 NOTE — Progress Notes (Addendum)
[place] at [facility] [address] [place], [place] [address] [phone]  Date:  12/24/2016   Name:  Matthew Rowland   DOB:  [birth year]   MRN:  [id number]  PCP:  Darreld Mclean, MD    Chief Complaint: Follow-up and Diabetes (states his BG levels are increasing and wants to discuss treatment )   History of Present Illness:  Matthew Rowland is a 71 y.o. very pleasant male patient who presents with the following:  Last seen by myself in March of this year-   Here today as a new patient.   His cardiologist, GI, nephology are all at [facility]. He needs a PCP to help him in case he were to get sick, and with other more general needs He was born in [place], he had been living in [place] until last fall.  Moved to this area in October of last year with his wife to be closer to family. He has a complex PMHx including atrial fib, CAD, CHF, HTN, CKD s/p kidney transplant and past history of pancreatitis   He thinks he first developed his kidney problmes when he had pancreatitis and then renal failure back in 2004.  He eventually had a transplant in 2014; he got the kidney from his wife.   The kidney is doing pretty well but he does not have a normal creat He has chronic low platelets- this also appeared after he had the pancreatitis.  They think this maybe due to his splenomegaly.  Has never been severe enough to cause him any problems  He was first dx with a fib in 2009 he thinks.  In 2013 he had an ablation for same; while still in the hospital he developed CP and underwent a cath. He was found to have a serious blockage and underwent a stent.   He had another ablation that also did not work and now just remains in a fib He is generally in a fib, and his rate is usually around 100 per his report His most recent echo was in December 20170 EF 40% He is on xarelto and troprol He does not have any spontaneous bleeding, but he will bleed and  bruise easily between his prednisone and xarelto  He continues to have some issues with his pancreatitis.  Reports that he has seen a lot of good doctors both here and in [place], and no one has been able to give him a reason for his diarrhea.  He is better on lomotil and pancreatic enzymes- he takes the zenpep TID, 3 pills each dose.    He has a history of borderline DM- he is not on any medication for same at this time.  He lost a lot of weight when he was so ill, which is good in that he no longer requires insulin His A1c was checked recently and it ws approx 7%. He was on insulin and had a pump actually in the past  He does not check his blood sugar at home anymore.  Does not have a working meter.  Would like to get one He finds that many healthy foods will irritate his stomach.  He does best with easier to digest foods but these are often cookies, etc  He notes he has a persistent nasal drip.  He has noticed this for 2-3 years.    Also, prior to leaving [place] he was doing some traveling, and started to itch. He  used some gold bond and got better. He has not noted any real differnce when he takes a shower/ hot water exposure   He does have BPH but this is not severe.  He would like to try atrovent nasal for his rhinorrhea which can be embarrassing.  He notes that his nose will often drip and people will think he is ill, he feels self conscious about having to wife his noseHe understands that if his urinary sx worsen he should stop this medication  I have reviewed notes from [place] system.  History is supplemented by his wife who is at the visit  He has not had a flu shot this year and would like to have one. He knows that some of his medications (prednisolone, prograf) may lessen the shot's efficacy  Pt has noted some weight loss, frequent falls, and concern about his blood sugar  He has noted his glucose "creaping up" recently when he has his blood drawn He is taking prednisolone 5  mg due to kidney transplant  He last fell about 4 months ago- he had gone through a period of more frequent falls, but has made some changes at home and is now doing better. He had tripped and fell- no collapse  His weight is varying between 140s- 150s. This has been his weight for the last couple of years.  In his younger years he struggled with overweight and was up to nearly 30 lbs at one point   His pancreas continues to be an issue.  However his pancreatic specialist does not think that this is causing the diarrhea - his GI doc on the other hand thinks that his pancrease is to blame, and they have him on zenpep  Wt Readings from Last 3 Encounters:  12/24/16 153 lb (69.4 kg)  06/22/16 162 lb 9.6 oz (73.8 kg)  06/01/16 150 lb (68 kg)   He has used glimepiride in the past which did help him and which he tolerated well.  Would prefer to go back to this medication if necessary    Patient Active Problem List   Diagnosis Date Noted  . Elbow injury, right, subsequent encounter 05/20/2016  . Fracture of right scapular body 05/20/2016  . Chronic combined systolic and diastolic CHF (congestive heart failure) ([place]) 03/29/2016  . Cardiomyopathy ([place]) 03/23/2016  . 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 Essential hypertension 01/15/2016  . H/O acute pancreatitis 01/15/2016  . Immunosuppression ([place]) 01/15/2016  . Type 2 diabetes mellitus with circulatory disorder ([place]) 01/15/2016  . Hx of kidney transplant 01/04/2016  . Folate deficiency 12/08/2015  . Pancytopenia ([place]) 12/08/2015  . CKD (chronic kidney disease) 01/03/2015  . Atrial fibrillation, persistent ([place]) 11/19/2014  . SSS (sick sinus syndrome) ([place]) 02/09/2014  . CAD (coronary artery disease) 02/21/2013    Past Medical History:  Diagnosis Date  . A-fib ([place])   . CHF (congestive heart failure) ([place])   . Chronic diarrhea   . Diabetes mellitus without complication (HCC)    borderline  . History of  ITP   . Hypertension   . Pancreatitis   . Renal disorder     Past Surgical History:  Procedure Laterality Date  . ABDOMINAL SURGERY    . CHOLECYSTECTOMY    . KIDNEY TRANSPLANT      Social History  Substance Use Topics  . Smoking status: Never Smoker  . Smokeless tobacco: Never Used  . Alcohol use No    No family history  on file.  Allergies  Allergen Reactions  . Atorvastatin Itching    Incessant itching which stopped when he stopped the atorvastatin.  Marland Kitchen Lisinopril Other (See Comments)    Causes pancreatitis     Medication list has been reviewed and updated.  Current Outpatient Prescriptions on File Prior to Visit  Medication Sig Dispense Refill  . diphenoxylate-atropine (LOMOTIL) 2.5-0.025 MG tablet TK 1 T PO TID PRF DH  2  . finasteride (PROSCAR) 5 MG tablet     . ipratropium (ATROVENT) 0.03 % nasal spray Place 2 sprays into the nose 4 (four) times daily. Use as needed for nasal drainage 30 mL 6  . metoprolol (TOPROL-XL) 200 MG 24 hr tablet     . pantoprazole (PROTONIX) 40 MG tablet Take 40 mg by mouth daily.    . polycarbophil (FIBERCON) 625 MG tablet Take 2 tablets (1,250 mg total) by mouth daily. 30 tablet 0  . prednisoLONE 5 MG TABS tablet Take by mouth.    . rivaroxaban (XARELTO) 20 MG TABS tablet Take 20 mg by mouth daily with supper.    . tacrolimus (PROGRAF) 1 MG capsule Take 1 mg by mouth 2 (two) times daily.    Marland Kitchen ZENPEP 25000 units CPEP      No current facility-administered medications on file prior to visit.     Review of Systems:  No polydipsia or polyuria He does notice his a fib at times but this is unchanged   Physical Examination: Vitals:   12/24/16 1146  BP: 105/75  Pulse: 85  Temp: 98.2 F (36.8 C)   Vitals:   12/24/16 1146  Weight: 153 lb (69.4 kg)  Height: 6\' 3"  (1.905 m)   Body mass index is 19.12 kg/m. Ideal Body Weight: Weight in (lb) to have BMI = 25: 199.6  GEN: WDWN, NAD, Non-toxic, A & O x 3, thin build, looks well  otherwise  HEENT: Atraumatic, Normocephalic. Neck supple. No masses, No LAD. Ears and Nose: No external deformity. CV: in rate controlled atrial fib, No M/G/R. No JVD. No thrill. No extra heart sounds. PULM: CTA B, no wheezes, crackles, rhonchi. No retractions. No resp. distress. No accessory muscle use. ABD: S, NT, ND, +BS. No rebound. No HSM. EXTR: No c/c/e NEURO Normal gait.  PSYCH: Normally interactive. Conversant. Not depressed or anxious appearing.  Calm demeanor.    Assessment and Plan: Controlled type 2 diabetes mellitus with complication, without long-term current use of insulin ([place]) - Plan: Hemoglobin A1c  Paroxysmal atrial fibrillation (HCC) - Plan: metoprolol succinate (TOPROL-XL) 100 MG 24 hr tablet  Need for influenza vaccination - Plan: Flu vaccine HIGH DOSE PF (Fluzone High dose)  Here today to check his A1c- will be in touch with him pending this result Flu shot today Rate controlled a fib, continue BB and xarelto  Will plan further follow- up pending labs.   Signed Lamar Blinks, MD  Received his A1c 9/9 and gave him a call- he had been on glimepiride in the past  No answer Will try back He can go back on glimpeiride, will start at 1mg  due to renal impairment  Results for orders placed or performed in visit on 12/24/16  Hemoglobin A1c  Result Value Ref Range   Hgb A1c MFr Bld 10.2 (H) 4.6 - 6.5 %    Called and reached him on 9/10- he is surprised as his A1c was 7.5 in May (last tested at [facility]   He needs a new meter- has not been checking  his blood sugar.   Will give him a meter, and an rx for strips to go with it He will start on amaryl 1 mg and come in for an A1c in 3 months

## 2016-12-24 NOTE — Patient Instructions (Signed)
It was nice to see you today!  I will be in touch with your A1c; if need be we can start you on a diabetes medication

## 2016-12-28 MED ORDER — GLUCOSE BLOOD VI STRP: ORAL_STRIP | 12 refills | Status: AC

## 2016-12-28 MED ORDER — GLIMEPIRIDE 1 MG PO TABS: 1.0000 mg | ORAL_TABLET | Freq: Every day | ORAL | 6 refills | Status: DC

## 2016-12-28 NOTE — Addendum Note (Signed)
Addended by: Lamar Blinks C on: 12/28/2016 05:12 PM   Modules accepted: Orders

## 2016-12-29 DIAGNOSIS — N3941 Urge incontinence: Secondary | ICD-10-CM | POA: Diagnosis not present

## 2016-12-29 DIAGNOSIS — B349 Viral infection, unspecified: Secondary | ICD-10-CM | POA: Diagnosis not present

## 2016-12-29 DIAGNOSIS — R319 Hematuria, unspecified: Secondary | ICD-10-CM | POA: Diagnosis not present

## 2016-12-29 DIAGNOSIS — R3129 Other microscopic hematuria: Secondary | ICD-10-CM | POA: Diagnosis not present

## 2016-12-29 DIAGNOSIS — R29898 Other symptoms and signs involving the musculoskeletal system: Secondary | ICD-10-CM | POA: Diagnosis not present

## 2016-12-29 DIAGNOSIS — L97511 Non-pressure chronic ulcer of other part of right foot limited to breakdown of skin: Secondary | ICD-10-CM | POA: Diagnosis not present

## 2016-12-29 DIAGNOSIS — R8299 Other abnormal findings in urine: Secondary | ICD-10-CM | POA: Diagnosis not present

## 2017-01-04 DIAGNOSIS — I10 Essential (primary) hypertension: Secondary | ICD-10-CM | POA: Diagnosis not present

## 2017-01-04 DIAGNOSIS — I2510 Atherosclerotic heart disease of native coronary artery without angina pectoris: Secondary | ICD-10-CM | POA: Diagnosis not present

## 2017-01-04 DIAGNOSIS — I482 Chronic atrial fibrillation: Secondary | ICD-10-CM | POA: Diagnosis not present

## 2017-01-04 DIAGNOSIS — I5042 Chronic combined systolic (congestive) and diastolic (congestive) heart failure: Secondary | ICD-10-CM | POA: Diagnosis not present

## 2017-01-04 DIAGNOSIS — I255 Ischemic cardiomyopathy: Secondary | ICD-10-CM | POA: Diagnosis not present

## 2017-01-04 DIAGNOSIS — Z940 Kidney transplant status: Secondary | ICD-10-CM | POA: Diagnosis not present

## 2017-01-05 ENCOUNTER — Telehealth: Payer: Self-pay | Admitting: Family Medicine

## 2017-01-05 NOTE — Telephone Encounter (Signed)
Relation to HS:JWTG Call back number:[phone] Pharmacy: [id number] [phone]Phone) [phone](Fax)     Reason for call:  Patient requesting Rx for the drums for glucose monitor, patient states the kit itself comes with 1 drum and a lot of test strips, requesting Rx for ACCU CHECK GUIDE drums only (each drum has six lancets)

## 2017-01-12 DIAGNOSIS — R161 Splenomegaly, not elsewhere classified: Secondary | ICD-10-CM | POA: Diagnosis not present

## 2017-01-12 DIAGNOSIS — Z940 Kidney transplant status: Secondary | ICD-10-CM | POA: Diagnosis not present

## 2017-01-12 DIAGNOSIS — R3129 Other microscopic hematuria: Secondary | ICD-10-CM | POA: Diagnosis not present

## 2017-01-12 DIAGNOSIS — N3941 Urge incontinence: Secondary | ICD-10-CM | POA: Diagnosis not present

## 2017-01-12 DIAGNOSIS — N200 Calculus of kidney: Secondary | ICD-10-CM | POA: Diagnosis not present

## 2017-01-13 DIAGNOSIS — Z940 Kidney transplant status: Secondary | ICD-10-CM | POA: Diagnosis not present

## 2017-01-13 DIAGNOSIS — D899 Disorder involving the immune mechanism, unspecified: Secondary | ICD-10-CM | POA: Diagnosis not present

## 2017-01-14 ENCOUNTER — Other Ambulatory Visit: Payer: Self-pay | Admitting: Emergency Medicine

## 2017-01-14 MED ORDER — ACCU-CHEK FASTCLIX LANCETS MISC: 2 refills | Status: DC

## 2017-01-14 NOTE — Telephone Encounter (Signed)
Rx sent to pharmacy for Accu-Chek Fastclix Lancets as requested.

## 2017-01-15 ENCOUNTER — Other Ambulatory Visit: Payer: Self-pay | Admitting: Emergency Medicine

## 2017-01-15 MED ORDER — ACCU-CHEK FASTCLIX LANCETS MISC: 2 refills | Status: AC

## 2017-02-10 ENCOUNTER — Telehealth: Payer: Self-pay | Admitting: Family Medicine

## 2017-02-10 DIAGNOSIS — R197 Diarrhea, unspecified: Secondary | ICD-10-CM | POA: Diagnosis not present

## 2017-02-10 DIAGNOSIS — R413 Other amnesia: Secondary | ICD-10-CM | POA: Diagnosis not present

## 2017-02-10 DIAGNOSIS — L988 Other specified disorders of the skin and subcutaneous tissue: Secondary | ICD-10-CM | POA: Diagnosis not present

## 2017-02-10 DIAGNOSIS — Z940 Kidney transplant status: Secondary | ICD-10-CM | POA: Diagnosis not present

## 2017-02-10 DIAGNOSIS — K863 Pseudocyst of pancreas: Secondary | ICD-10-CM | POA: Diagnosis not present

## 2017-02-10 DIAGNOSIS — I4891 Unspecified atrial fibrillation: Secondary | ICD-10-CM | POA: Diagnosis not present

## 2017-02-10 DIAGNOSIS — Z4822 Encounter for aftercare following kidney transplant: Secondary | ICD-10-CM | POA: Diagnosis not present

## 2017-02-10 DIAGNOSIS — D899 Disorder involving the immune mechanism, unspecified: Secondary | ICD-10-CM | POA: Diagnosis not present

## 2017-02-10 DIAGNOSIS — E119 Type 2 diabetes mellitus without complications: Secondary | ICD-10-CM | POA: Diagnosis not present

## 2017-02-10 DIAGNOSIS — I10 Essential (primary) hypertension: Secondary | ICD-10-CM | POA: Diagnosis not present

## 2017-02-10 DIAGNOSIS — Z23 Encounter for immunization: Secondary | ICD-10-CM | POA: Diagnosis not present

## 2017-02-10 NOTE — Telephone Encounter (Signed)
[facility] [address] [facility] [phone] called to say they faxed info to Dr Lorelei Pont recommending pt take a statin drug to reduce risk of heart disease. Please call pharmacist and consider a statin drug in pt medication regimen.

## 2017-02-12 NOTE — Telephone Encounter (Signed)
Called and LMOM- pt has been intolerant in the past.  His cardiologist had tried to start again but pt refused

## 2017-02-19 DIAGNOSIS — R3129 Other microscopic hematuria: Secondary | ICD-10-CM | POA: Diagnosis not present

## 2017-02-19 DIAGNOSIS — N3941 Urge incontinence: Secondary | ICD-10-CM | POA: Diagnosis not present

## 2017-02-22 DIAGNOSIS — K863 Pseudocyst of pancreas: Secondary | ICD-10-CM | POA: Diagnosis not present

## 2017-02-22 DIAGNOSIS — Z8719 Personal history of other diseases of the digestive system: Secondary | ICD-10-CM | POA: Diagnosis not present

## 2017-02-22 DIAGNOSIS — K529 Noninfective gastroenteritis and colitis, unspecified: Secondary | ICD-10-CM | POA: Diagnosis not present

## 2017-02-23 DIAGNOSIS — L97511 Non-pressure chronic ulcer of other part of right foot limited to breakdown of skin: Secondary | ICD-10-CM | POA: Diagnosis not present

## 2017-02-23 DIAGNOSIS — R29898 Other symptoms and signs involving the musculoskeletal system: Secondary | ICD-10-CM | POA: Diagnosis not present

## 2017-02-24 DIAGNOSIS — K529 Noninfective gastroenteritis and colitis, unspecified: Secondary | ICD-10-CM | POA: Diagnosis not present

## 2017-02-25 IMAGING — DX DG ELBOW COMPLETE 3+V*R*
4 series · 4 of 4 positions shown · non-contrast
Comparison: None.

CLINICAL DATA: Fall 6 hrs ago, some rt elbow discomfort.

EXAM:
RIGHT ELBOW - COMPLETE 3+ VIEW

[elbow ap]
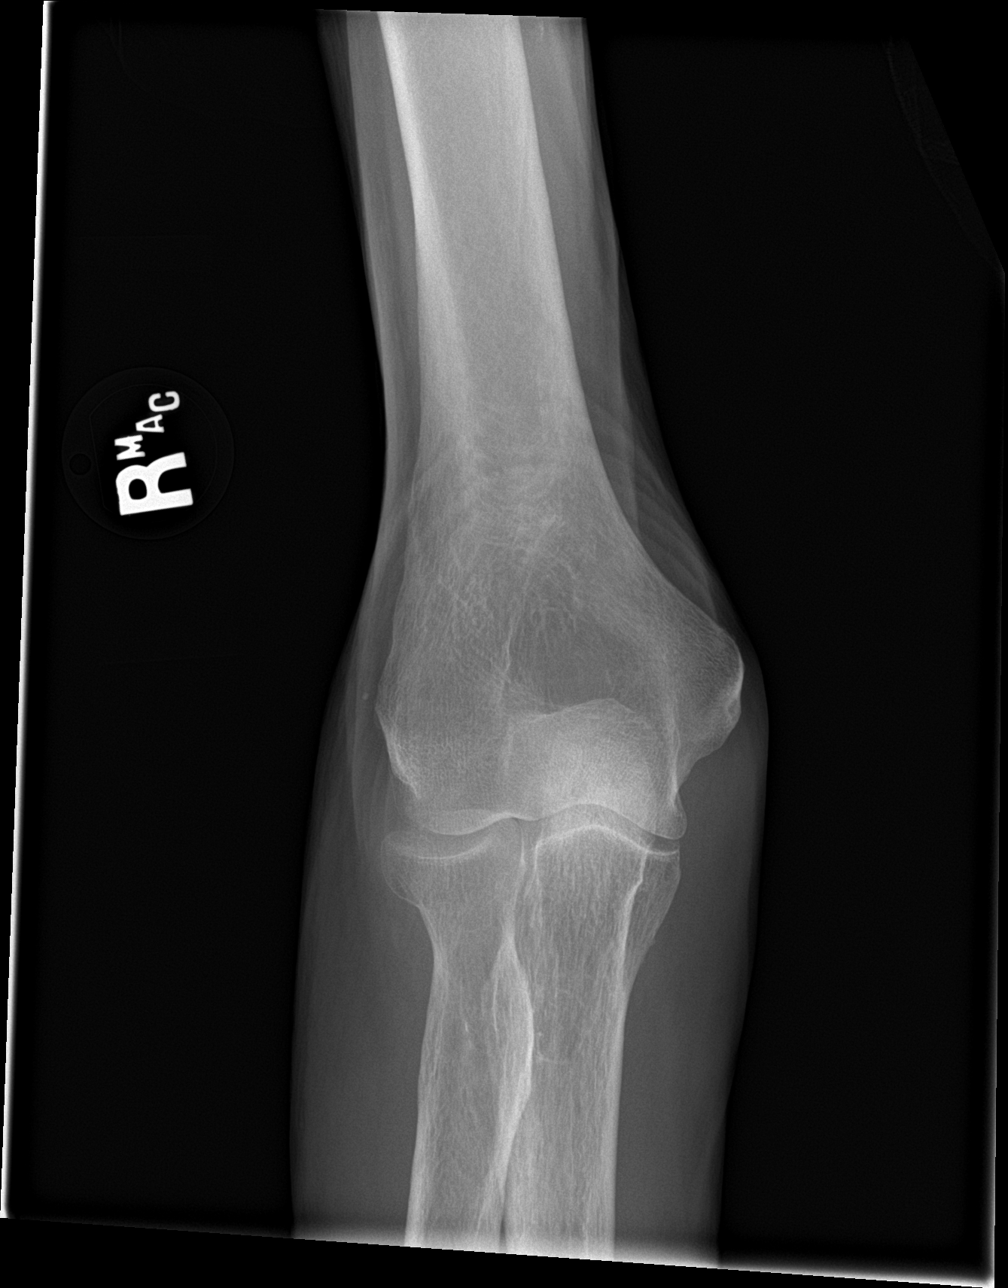

[elbow obl (1 of 2)]
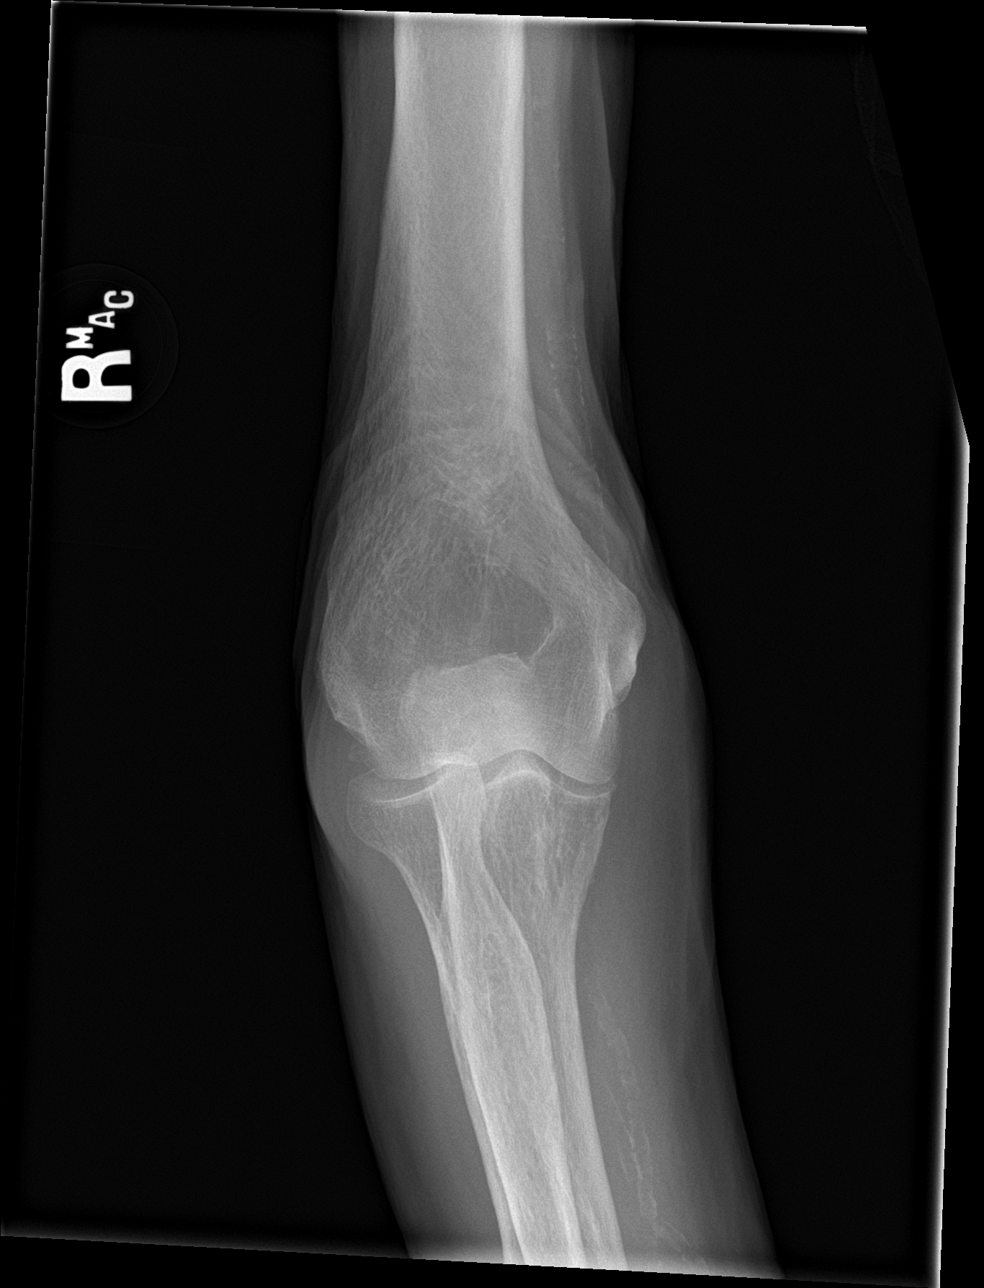

[elbow obl (2 of 2)]
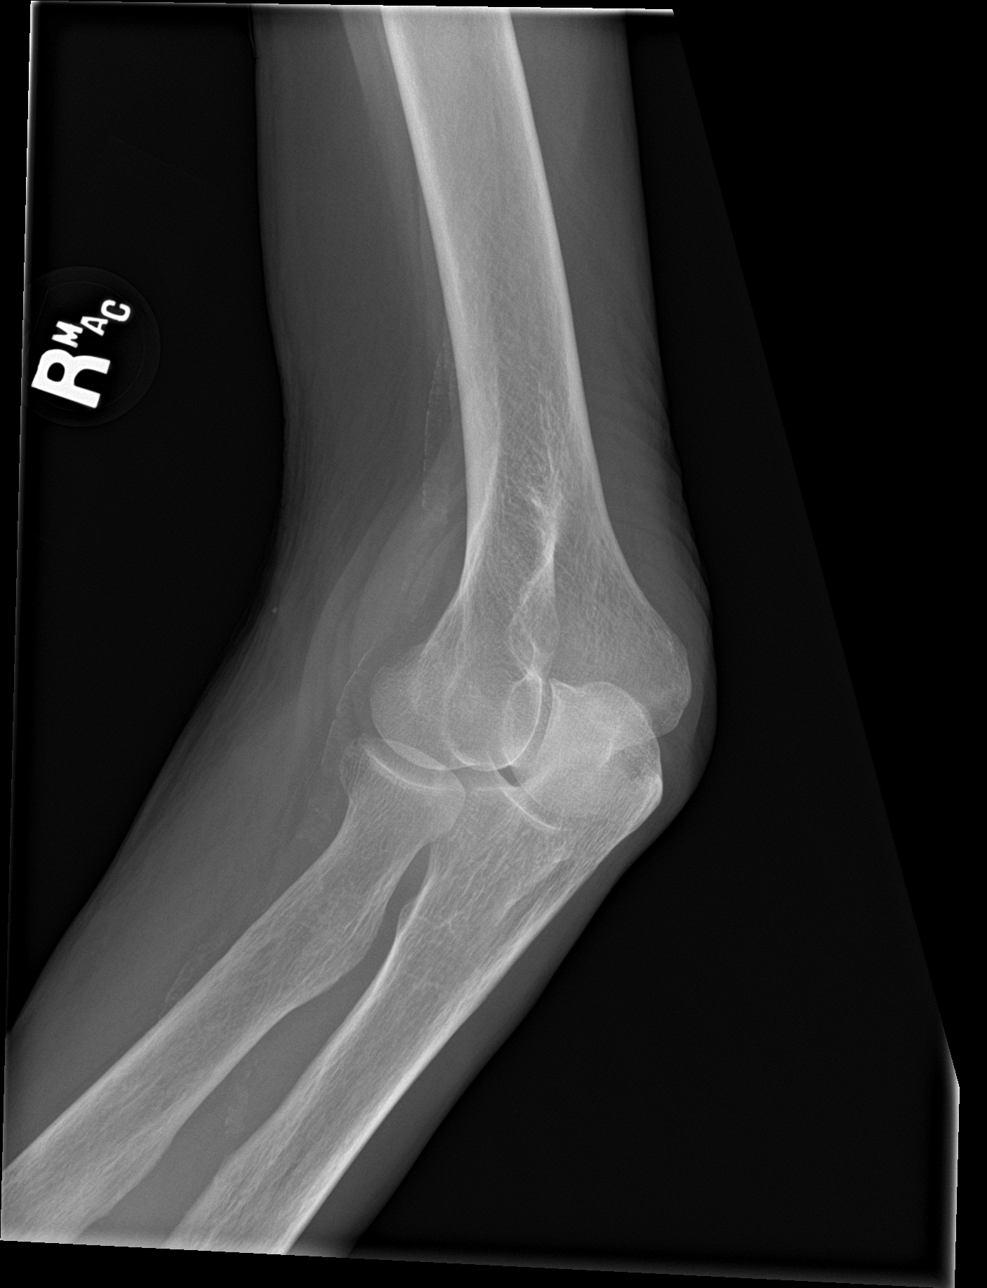

[elbow lat]
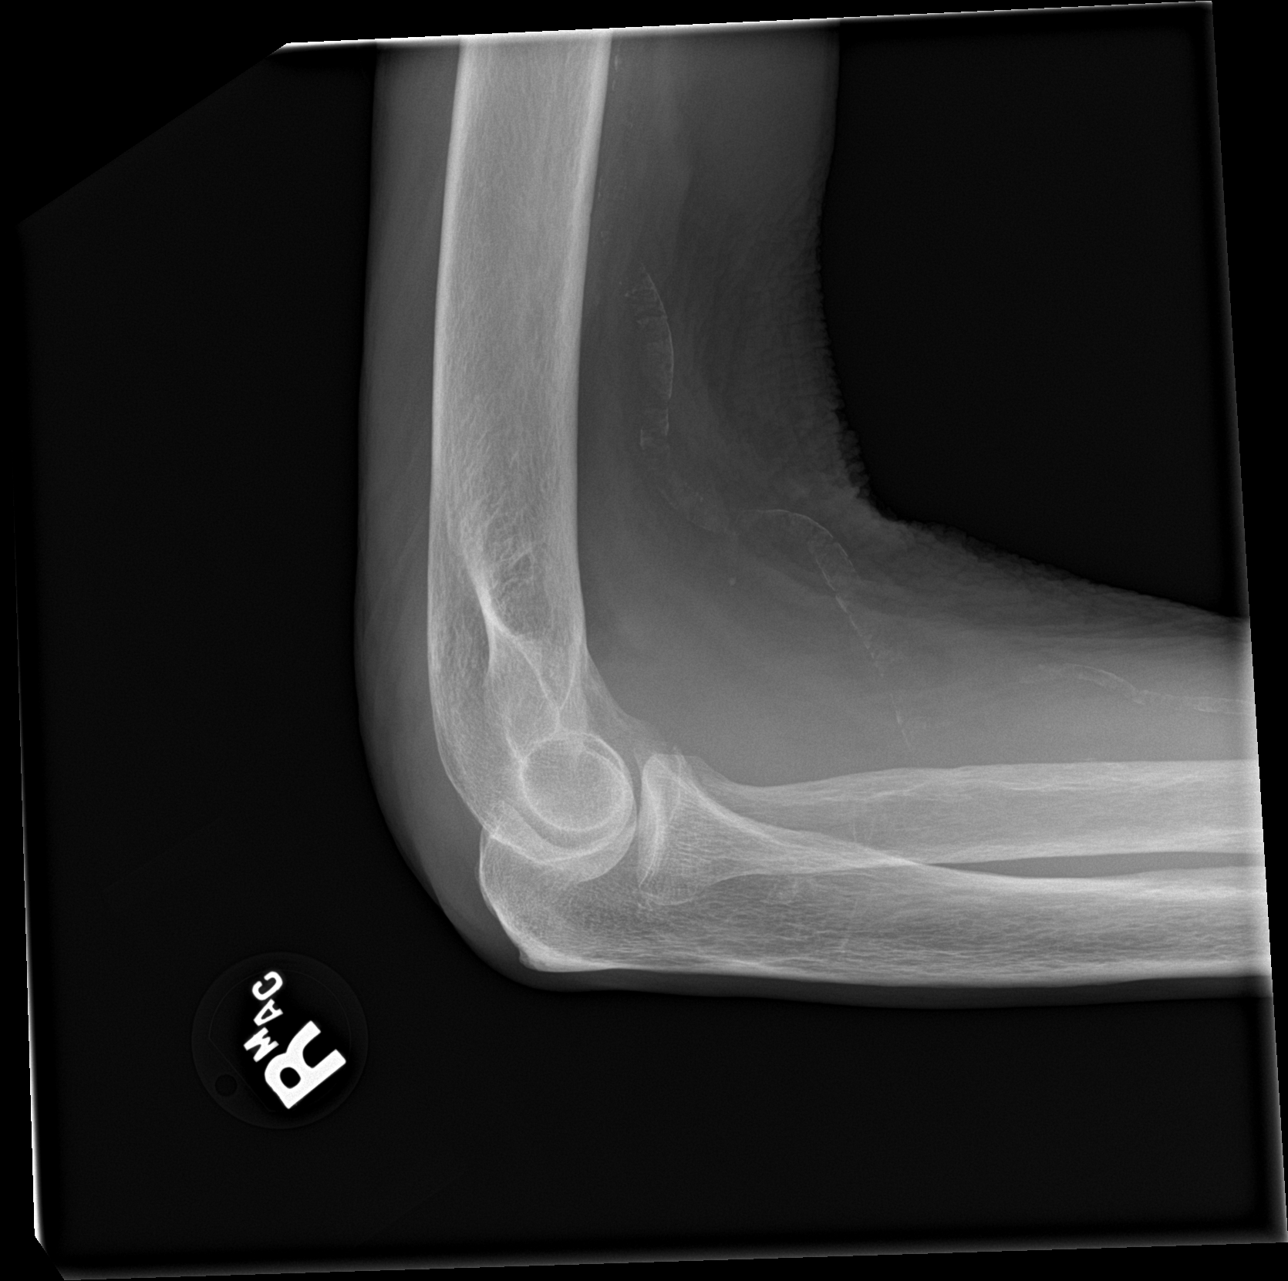

[4 of 4 positions shown; findings below may reference images not displayed]

FINDINGS: Abnormal elbow joint effusion noted with anterior sail sign.
Supinator fat pad indistinct. There is some slight irregularity of
the radial head which could be from subtle fracture or spur.
Suspected spurring of the coronoid process. Arterial vascular
calcifications noted.
IMPRESSION: 1. Abnormal exam with elbow joint effusion and effaced supinator fat
pad. Questionable radial head fracture. Consider CT to better
characterize given the significant possibility of occult fracture.
2. Atherosclerotic vascular calcifications.

## 2017-02-25 IMAGING — CT CT HEAD W/O CM
3 series · 14 of 47 positions shown, 16 images · non-contrast
Comparison: None.

CLINICAL DATA: 70-year-old male with acute fall and head injury.
Patient on Xarelto. Initial encounter.

EXAM:
CT HEAD WITHOUT CONTRAST
TECHNIQUE: Contiguous axial images were obtained from the base of the skull
through the vertex without intravenous contrast.

[Series 2: head wo · axial · 0.47mm/px · z∈[-168,-43]mm · 8 of 31 slices shown, 10 images]
[im 3/31  brain]
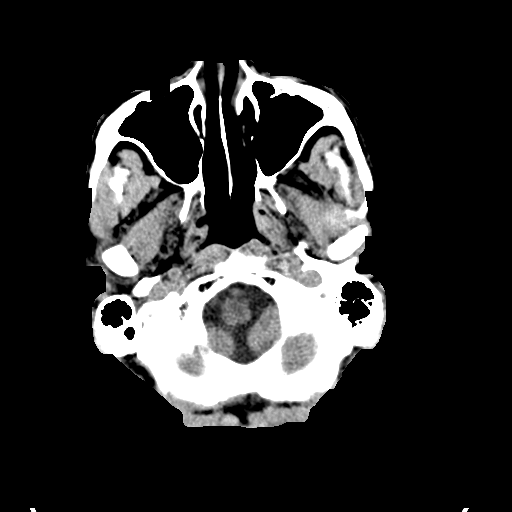
[im 3/31  bone]
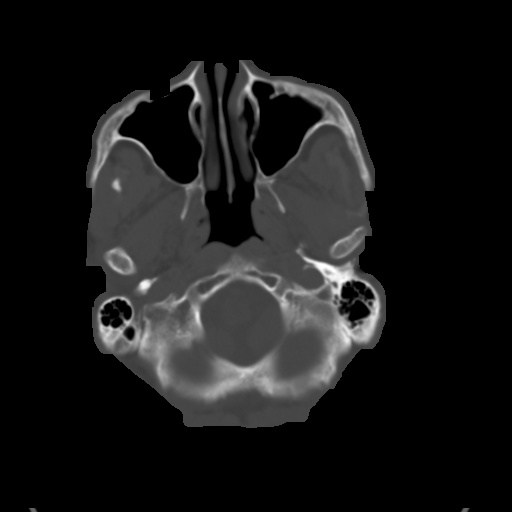
[im 7/31  brain]
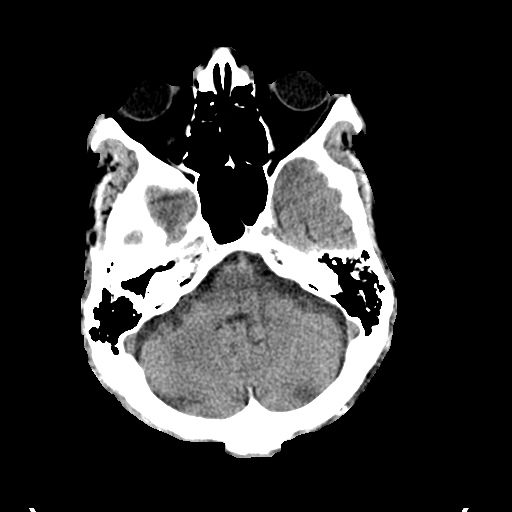
[im 10/31  brain]
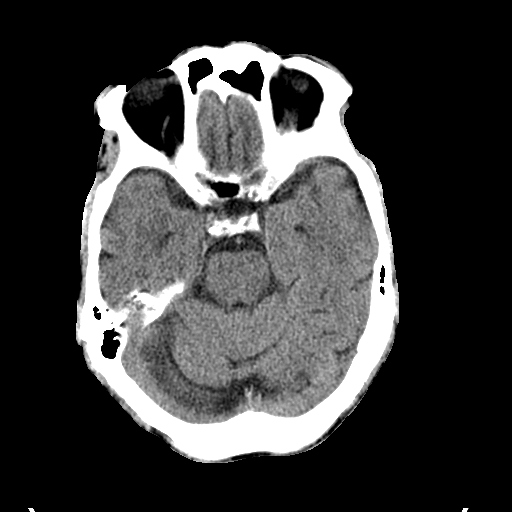
[im 14/31  brain]
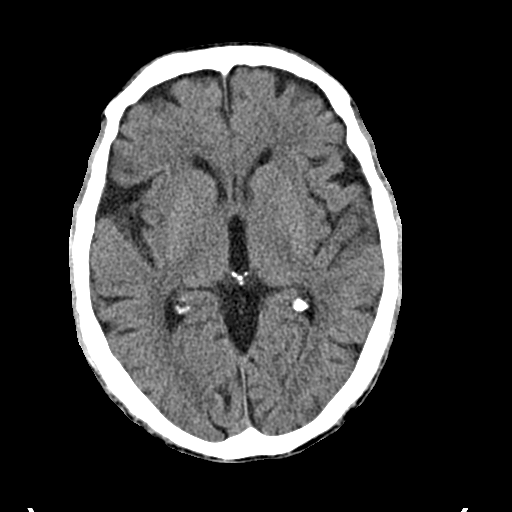
[im 17/31  brain]
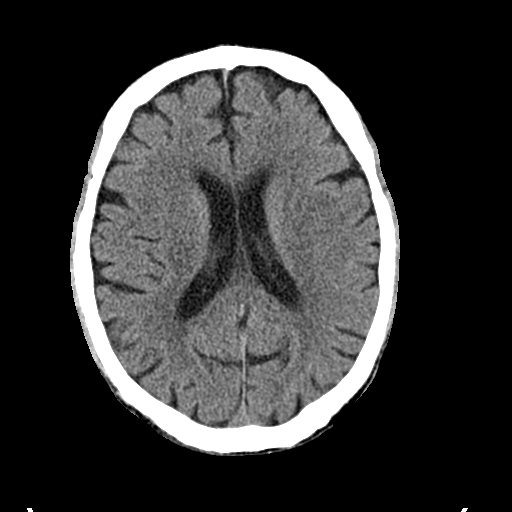
[im 17/31  bone]
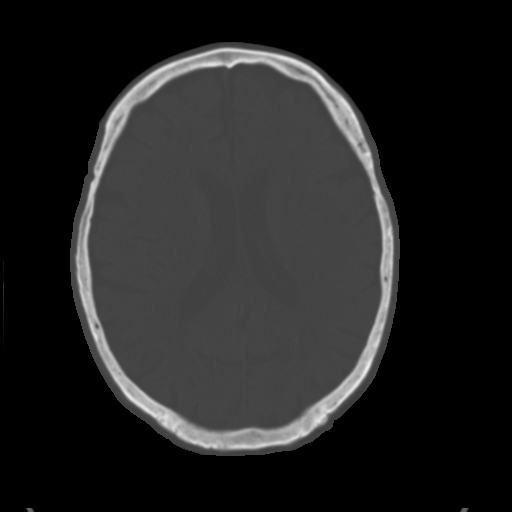
[im 21/31  brain]
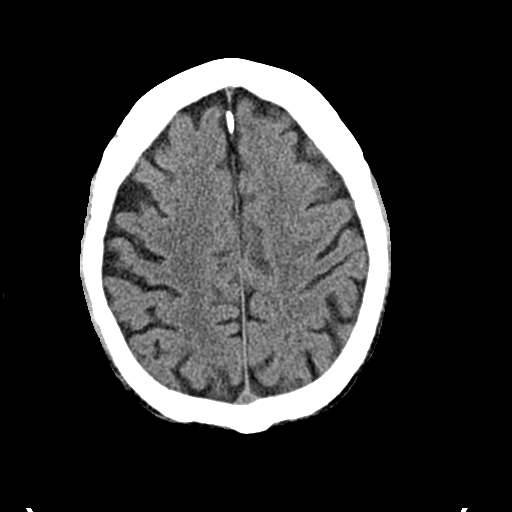
[im 24/31  brain]
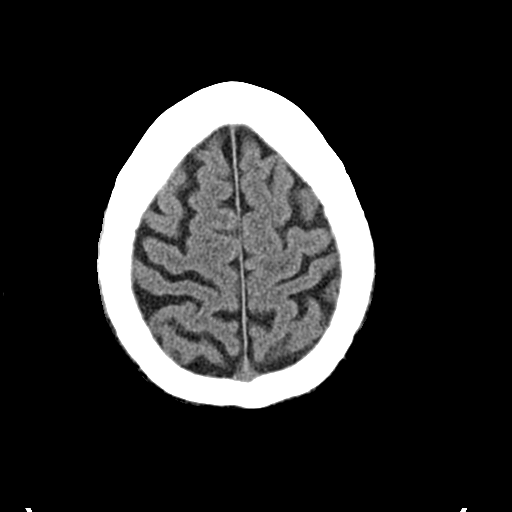
[im 28/31  brain]
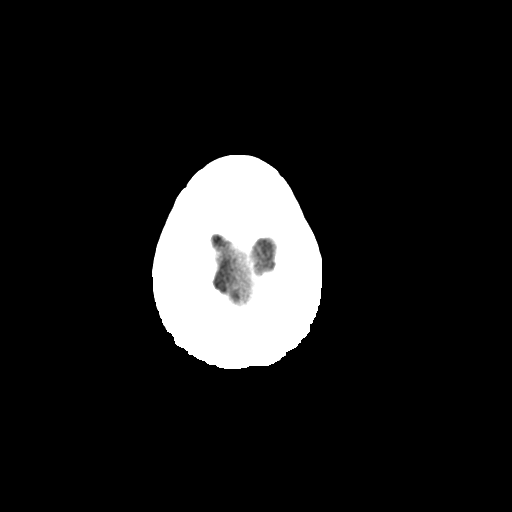

[Series 4: cor soft · coronal · 0.30mm/px · 3 of 71 slices shown]
[im 24/71  brain]
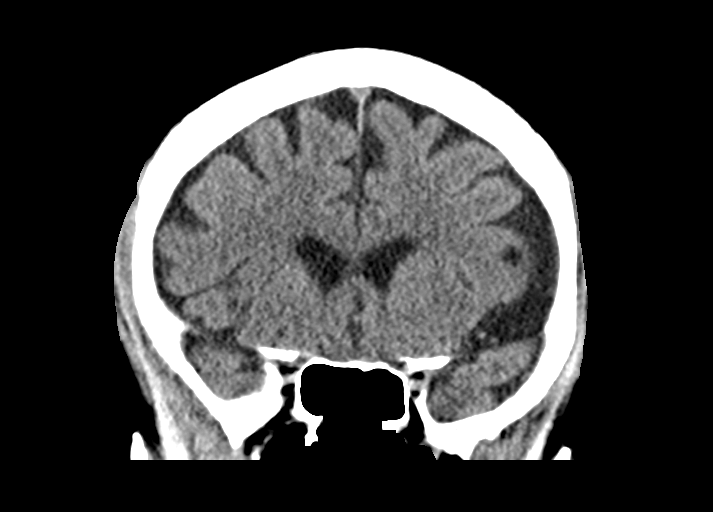
[im 32/71  brain]
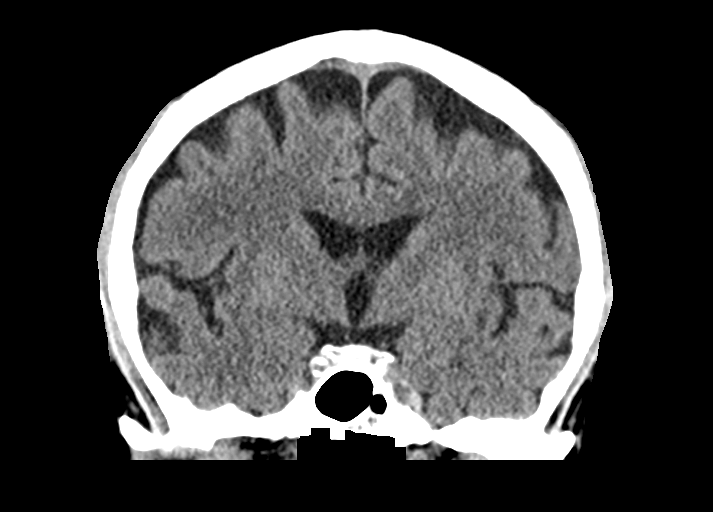
[im 39/71  brain]
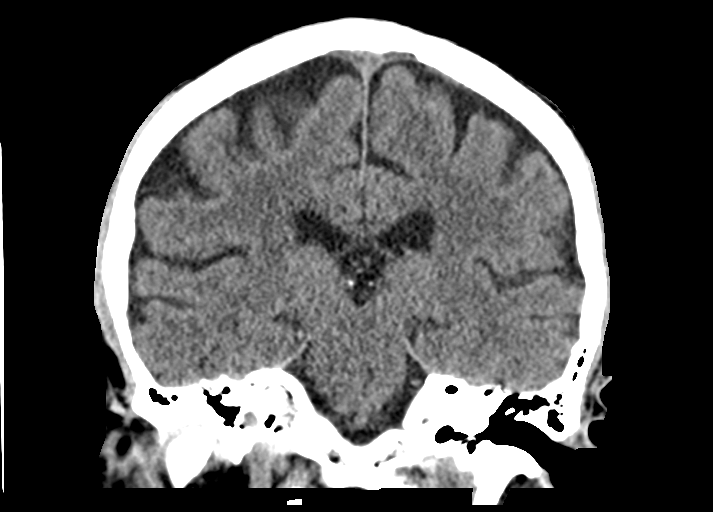

[Series 5: sag soft · sagittal · 0.32mm/px · 3 of 60 slices shown]
[im 20/60  brain]
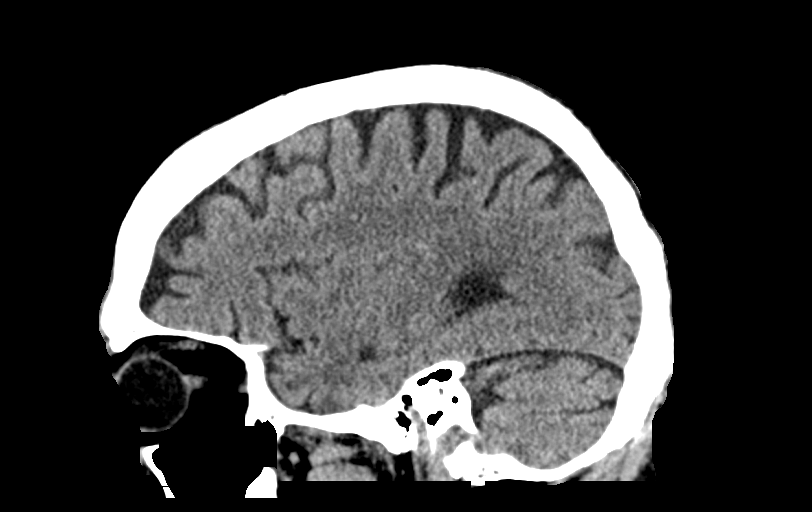
[im 30/60  brain]
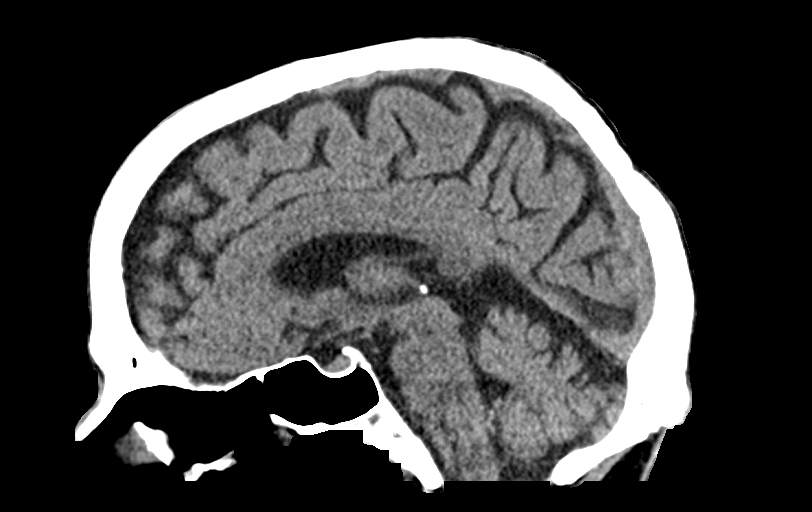
[im 40/60  brain]
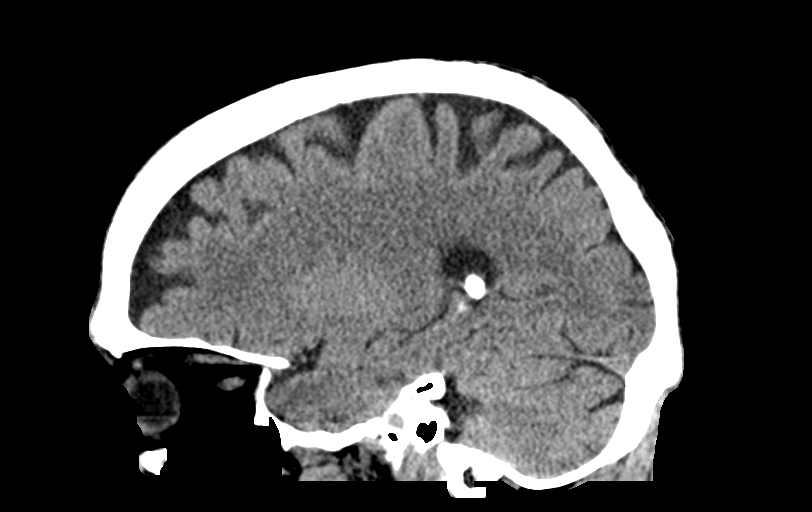

[14 of 47 positions shown; findings below may reference images not displayed]

FINDINGS: Brain: No evidence of acute infarction, hemorrhage, hydrocephalus,
extra-axial collection or mass lesion/mass effect.

Mild generalized cerebral and cerebellar volume loss and mild
chronic small-vessel white matter ischemic changes identified.

Vascular: Intracranial atherosclerotic calcifications noted.

Skull: Normal. Negative for fracture or focal lesion.

Sinuses/Orbits: No acute finding.

Other: Mild right facial soft tissue swelling noted.
IMPRESSION: No evidence of acute intracranial abnormality.

Mild right facial soft tissue swelling without evidence of fracture

Mild atrophy and chronic small-vessel white matter ischemic changes.

## 2017-03-10 DIAGNOSIS — Z940 Kidney transplant status: Secondary | ICD-10-CM | POA: Diagnosis not present

## 2017-03-10 DIAGNOSIS — D899 Disorder involving the immune mechanism, unspecified: Secondary | ICD-10-CM | POA: Diagnosis not present

## 2017-03-19 DIAGNOSIS — K529 Noninfective gastroenteritis and colitis, unspecified: Secondary | ICD-10-CM | POA: Diagnosis not present

## 2017-03-19 DIAGNOSIS — Z940 Kidney transplant status: Secondary | ICD-10-CM | POA: Diagnosis not present

## 2017-03-19 DIAGNOSIS — Z5181 Encounter for therapeutic drug level monitoring: Secondary | ICD-10-CM | POA: Diagnosis not present

## 2017-03-22 DIAGNOSIS — K529 Noninfective gastroenteritis and colitis, unspecified: Secondary | ICD-10-CM | POA: Diagnosis not present

## 2017-03-22 DIAGNOSIS — D801 Nonfamilial hypogammaglobulinemia: Secondary | ICD-10-CM | POA: Diagnosis not present

## 2017-03-22 DIAGNOSIS — Z940 Kidney transplant status: Secondary | ICD-10-CM | POA: Diagnosis not present

## 2017-03-22 DIAGNOSIS — D735 Infarction of spleen: Secondary | ICD-10-CM | POA: Diagnosis not present

## 2017-03-22 DIAGNOSIS — Z7901 Long term (current) use of anticoagulants: Secondary | ICD-10-CM | POA: Diagnosis not present

## 2017-03-22 DIAGNOSIS — K2960 Other gastritis without bleeding: Secondary | ICD-10-CM | POA: Diagnosis not present

## 2017-03-22 DIAGNOSIS — K317 Polyp of stomach and duodenum: Secondary | ICD-10-CM | POA: Diagnosis not present

## 2017-03-22 DIAGNOSIS — Z79899 Other long term (current) drug therapy: Secondary | ICD-10-CM | POA: Diagnosis not present

## 2017-03-22 DIAGNOSIS — K6289 Other specified diseases of anus and rectum: Secondary | ICD-10-CM | POA: Diagnosis not present

## 2017-03-22 DIAGNOSIS — K3189 Other diseases of stomach and duodenum: Secondary | ICD-10-CM | POA: Diagnosis not present

## 2017-03-22 DIAGNOSIS — K2950 Unspecified chronic gastritis without bleeding: Secondary | ICD-10-CM | POA: Diagnosis not present

## 2017-03-22 DIAGNOSIS — Z86718 Personal history of other venous thrombosis and embolism: Secondary | ICD-10-CM | POA: Diagnosis not present

## 2017-03-22 DIAGNOSIS — R197 Diarrhea, unspecified: Secondary | ICD-10-CM | POA: Diagnosis not present

## 2017-03-22 DIAGNOSIS — K5730 Diverticulosis of large intestine without perforation or abscess without bleeding: Secondary | ICD-10-CM | POA: Diagnosis not present

## 2017-03-22 LAB — HM COLONOSCOPY

## 2017-04-01 ENCOUNTER — Encounter: Payer: Self-pay | Admitting: Family Medicine

## 2017-04-06 DIAGNOSIS — L97512 Non-pressure chronic ulcer of other part of right foot with fat layer exposed: Secondary | ICD-10-CM | POA: Diagnosis not present

## 2017-04-06 DIAGNOSIS — S91312A Laceration without foreign body, left foot, initial encounter: Secondary | ICD-10-CM | POA: Diagnosis not present

## 2017-04-07 DIAGNOSIS — Z940 Kidney transplant status: Secondary | ICD-10-CM | POA: Diagnosis not present

## 2017-04-07 DIAGNOSIS — D899 Disorder involving the immune mechanism, unspecified: Secondary | ICD-10-CM | POA: Diagnosis not present

## 2017-04-19 DIAGNOSIS — I255 Ischemic cardiomyopathy: Secondary | ICD-10-CM | POA: Diagnosis not present

## 2017-04-19 DIAGNOSIS — Z7901 Long term (current) use of anticoagulants: Secondary | ICD-10-CM | POA: Diagnosis not present

## 2017-04-19 DIAGNOSIS — I083 Combined rheumatic disorders of mitral, aortic and tricuspid valves: Secondary | ICD-10-CM | POA: Diagnosis not present

## 2017-04-19 DIAGNOSIS — I482 Chronic atrial fibrillation: Secondary | ICD-10-CM | POA: Diagnosis not present

## 2017-04-19 DIAGNOSIS — I2510 Atherosclerotic heart disease of native coronary artery without angina pectoris: Secondary | ICD-10-CM | POA: Diagnosis not present

## 2017-04-19 DIAGNOSIS — I517 Cardiomegaly: Secondary | ICD-10-CM | POA: Diagnosis not present

## 2017-04-19 DIAGNOSIS — E1143 Type 2 diabetes mellitus with diabetic autonomic (poly)neuropathy: Secondary | ICD-10-CM | POA: Diagnosis not present

## 2017-04-19 DIAGNOSIS — R931 Abnormal findings on diagnostic imaging of heart and coronary circulation: Secondary | ICD-10-CM | POA: Diagnosis not present

## 2017-04-19 DIAGNOSIS — Z7984 Long term (current) use of oral hypoglycemic drugs: Secondary | ICD-10-CM | POA: Diagnosis not present

## 2017-04-19 DIAGNOSIS — I5042 Chronic combined systolic (congestive) and diastolic (congestive) heart failure: Secondary | ICD-10-CM | POA: Diagnosis not present

## 2017-04-19 DIAGNOSIS — Z79899 Other long term (current) drug therapy: Secondary | ICD-10-CM | POA: Diagnosis not present

## 2017-04-19 DIAGNOSIS — R9431 Abnormal electrocardiogram [ECG] [EKG]: Secondary | ICD-10-CM | POA: Diagnosis not present

## 2017-04-19 DIAGNOSIS — Z940 Kidney transplant status: Secondary | ICD-10-CM | POA: Diagnosis not present

## 2017-04-19 DIAGNOSIS — I428 Other cardiomyopathies: Secondary | ICD-10-CM | POA: Diagnosis not present

## 2017-04-19 DIAGNOSIS — I110 Hypertensive heart disease with heart failure: Secondary | ICD-10-CM | POA: Diagnosis not present

## 2017-04-26 DIAGNOSIS — Z5181 Encounter for therapeutic drug level monitoring: Secondary | ICD-10-CM | POA: Diagnosis not present

## 2017-04-26 DIAGNOSIS — Z940 Kidney transplant status: Secondary | ICD-10-CM | POA: Diagnosis not present

## 2017-04-27 DIAGNOSIS — L97511 Non-pressure chronic ulcer of other part of right foot limited to breakdown of skin: Secondary | ICD-10-CM | POA: Diagnosis not present

## 2017-04-27 DIAGNOSIS — S91312D Laceration without foreign body, left foot, subsequent encounter: Secondary | ICD-10-CM | POA: Diagnosis not present

## 2017-04-30 DIAGNOSIS — K529 Noninfective gastroenteritis and colitis, unspecified: Secondary | ICD-10-CM | POA: Diagnosis not present

## 2017-05-05 DIAGNOSIS — Z940 Kidney transplant status: Secondary | ICD-10-CM | POA: Diagnosis not present

## 2017-05-05 DIAGNOSIS — K529 Noninfective gastroenteritis and colitis, unspecified: Secondary | ICD-10-CM | POA: Diagnosis not present

## 2017-05-05 DIAGNOSIS — D899 Disorder involving the immune mechanism, unspecified: Secondary | ICD-10-CM | POA: Diagnosis not present

## 2017-05-13 DIAGNOSIS — L111 Transient acantholytic dermatosis [Grover]: Secondary | ICD-10-CM | POA: Diagnosis not present

## 2017-05-13 DIAGNOSIS — C4490 Unspecified malignant neoplasm of skin, unspecified: Secondary | ICD-10-CM | POA: Diagnosis not present

## 2017-05-13 DIAGNOSIS — K529 Noninfective gastroenteritis and colitis, unspecified: Secondary | ICD-10-CM | POA: Diagnosis not present

## 2017-05-13 DIAGNOSIS — L988 Other specified disorders of the skin and subcutaneous tissue: Secondary | ICD-10-CM | POA: Diagnosis not present

## 2017-05-13 DIAGNOSIS — L814 Other melanin hyperpigmentation: Secondary | ICD-10-CM | POA: Diagnosis not present

## 2017-05-13 DIAGNOSIS — D2270 Melanocytic nevi of unspecified lower limb, including hip: Secondary | ICD-10-CM | POA: Diagnosis not present

## 2017-05-13 DIAGNOSIS — L97921 Non-pressure chronic ulcer of unspecified part of left lower leg limited to breakdown of skin: Secondary | ICD-10-CM | POA: Diagnosis not present

## 2017-05-13 DIAGNOSIS — L821 Other seborrheic keratosis: Secondary | ICD-10-CM | POA: Diagnosis not present

## 2017-05-13 DIAGNOSIS — L97501 Non-pressure chronic ulcer of other part of unspecified foot limited to breakdown of skin: Secondary | ICD-10-CM | POA: Diagnosis not present

## 2017-05-13 DIAGNOSIS — D485 Neoplasm of uncertain behavior of skin: Secondary | ICD-10-CM | POA: Diagnosis not present

## 2017-05-13 DIAGNOSIS — D1801 Hemangioma of skin and subcutaneous tissue: Secondary | ICD-10-CM | POA: Diagnosis not present

## 2017-05-13 DIAGNOSIS — C4492 Squamous cell carcinoma of skin, unspecified: Secondary | ICD-10-CM | POA: Diagnosis not present

## 2017-05-13 DIAGNOSIS — Z85828 Personal history of other malignant neoplasm of skin: Secondary | ICD-10-CM | POA: Diagnosis not present

## 2017-05-13 DIAGNOSIS — L570 Actinic keratosis: Secondary | ICD-10-CM | POA: Diagnosis not present

## 2017-05-25 DIAGNOSIS — N138 Other obstructive and reflux uropathy: Secondary | ICD-10-CM | POA: Diagnosis not present

## 2017-05-25 DIAGNOSIS — N401 Enlarged prostate with lower urinary tract symptoms: Secondary | ICD-10-CM | POA: Diagnosis not present

## 2017-05-25 DIAGNOSIS — R3129 Other microscopic hematuria: Secondary | ICD-10-CM | POA: Diagnosis not present

## 2017-05-25 DIAGNOSIS — N3941 Urge incontinence: Secondary | ICD-10-CM | POA: Diagnosis not present

## 2017-05-31 DIAGNOSIS — Z85828 Personal history of other malignant neoplasm of skin: Secondary | ICD-10-CM | POA: Diagnosis not present

## 2017-05-31 DIAGNOSIS — L570 Actinic keratosis: Secondary | ICD-10-CM | POA: Diagnosis not present

## 2017-05-31 DIAGNOSIS — C44329 Squamous cell carcinoma of skin of other parts of face: Secondary | ICD-10-CM | POA: Diagnosis not present

## 2017-06-01 DIAGNOSIS — L97511 Non-pressure chronic ulcer of other part of right foot limited to breakdown of skin: Secondary | ICD-10-CM | POA: Diagnosis not present

## 2017-06-01 DIAGNOSIS — S91312D Laceration without foreign body, left foot, subsequent encounter: Secondary | ICD-10-CM | POA: Diagnosis not present

## 2017-06-02 ENCOUNTER — Encounter: Payer: Self-pay | Admitting: Family Medicine

## 2017-06-02 DIAGNOSIS — Z940 Kidney transplant status: Secondary | ICD-10-CM | POA: Diagnosis not present

## 2017-06-02 DIAGNOSIS — C44320 Squamous cell carcinoma of skin of unspecified parts of face: Secondary | ICD-10-CM | POA: Insufficient documentation

## 2017-06-02 DIAGNOSIS — I482 Chronic atrial fibrillation: Secondary | ICD-10-CM | POA: Diagnosis not present

## 2017-06-02 DIAGNOSIS — K863 Pseudocyst of pancreas: Secondary | ICD-10-CM | POA: Diagnosis not present

## 2017-06-02 DIAGNOSIS — Z125 Encounter for screening for malignant neoplasm of prostate: Secondary | ICD-10-CM | POA: Diagnosis not present

## 2017-06-02 DIAGNOSIS — Z79899 Other long term (current) drug therapy: Secondary | ICD-10-CM | POA: Diagnosis not present

## 2017-06-02 DIAGNOSIS — R809 Proteinuria, unspecified: Secondary | ICD-10-CM | POA: Diagnosis not present

## 2017-06-02 DIAGNOSIS — Z4822 Encounter for aftercare following kidney transplant: Secondary | ICD-10-CM | POA: Diagnosis not present

## 2017-06-02 DIAGNOSIS — I10 Essential (primary) hypertension: Secondary | ICD-10-CM | POA: Diagnosis not present

## 2017-06-02 DIAGNOSIS — R197 Diarrhea, unspecified: Secondary | ICD-10-CM | POA: Diagnosis not present

## 2017-06-02 DIAGNOSIS — E119 Type 2 diabetes mellitus without complications: Secondary | ICD-10-CM | POA: Diagnosis not present

## 2017-06-15 DIAGNOSIS — C44329 Squamous cell carcinoma of skin of other parts of face: Secondary | ICD-10-CM | POA: Diagnosis not present

## 2017-06-15 DIAGNOSIS — Z483 Aftercare following surgery for neoplasm: Secondary | ICD-10-CM | POA: Diagnosis not present

## 2017-06-29 DIAGNOSIS — L97511 Non-pressure chronic ulcer of other part of right foot limited to breakdown of skin: Secondary | ICD-10-CM | POA: Diagnosis not present

## 2017-06-29 DIAGNOSIS — S91312D Laceration without foreign body, left foot, subsequent encounter: Secondary | ICD-10-CM | POA: Diagnosis not present

## 2017-07-02 DIAGNOSIS — Z4822 Encounter for aftercare following kidney transplant: Secondary | ICD-10-CM | POA: Diagnosis not present

## 2017-07-02 DIAGNOSIS — Z79899 Other long term (current) drug therapy: Secondary | ICD-10-CM | POA: Diagnosis not present

## 2017-07-02 DIAGNOSIS — E119 Type 2 diabetes mellitus without complications: Secondary | ICD-10-CM | POA: Diagnosis not present

## 2017-07-04 ENCOUNTER — Other Ambulatory Visit: Payer: Self-pay | Admitting: Family Medicine

## 2017-07-04 DIAGNOSIS — E118 Type 2 diabetes mellitus with unspecified complications: Secondary | ICD-10-CM

## 2017-07-30 DIAGNOSIS — D899 Disorder involving the immune mechanism, unspecified: Secondary | ICD-10-CM | POA: Diagnosis not present

## 2017-07-30 DIAGNOSIS — Z940 Kidney transplant status: Secondary | ICD-10-CM | POA: Diagnosis not present

## 2017-08-02 DIAGNOSIS — H04123 Dry eye syndrome of bilateral lacrimal glands: Secondary | ICD-10-CM | POA: Diagnosis not present

## 2017-08-02 DIAGNOSIS — H539 Unspecified visual disturbance: Secondary | ICD-10-CM | POA: Diagnosis not present

## 2017-08-02 DIAGNOSIS — Z7984 Long term (current) use of oral hypoglycemic drugs: Secondary | ICD-10-CM | POA: Diagnosis not present

## 2017-08-02 DIAGNOSIS — Z961 Presence of intraocular lens: Secondary | ICD-10-CM | POA: Diagnosis not present

## 2017-08-02 DIAGNOSIS — E119 Type 2 diabetes mellitus without complications: Secondary | ICD-10-CM | POA: Diagnosis not present

## 2017-08-03 DIAGNOSIS — L97511 Non-pressure chronic ulcer of other part of right foot limited to breakdown of skin: Secondary | ICD-10-CM | POA: Diagnosis not present

## 2017-08-12 ENCOUNTER — Ambulatory Visit (INDEPENDENT_AMBULATORY_CARE_PROVIDER_SITE_OTHER): Payer: Medicare Other | Admitting: Medical

## 2017-08-12 ENCOUNTER — Encounter: Payer: Self-pay | Admitting: Medical

## 2017-08-12 VITALS — BP 104/72 | HR 82 | Temp 98.0°F | Resp 16 | Ht 75.0 in | Wt 150.8 lb

## 2017-08-12 DIAGNOSIS — L989 Disorder of the skin and subcutaneous tissue, unspecified: Secondary | ICD-10-CM | POA: Diagnosis not present

## 2017-08-12 DIAGNOSIS — R252 Cramp and spasm: Secondary | ICD-10-CM | POA: Diagnosis not present

## 2017-08-12 DIAGNOSIS — R5383 Other fatigue: Secondary | ICD-10-CM | POA: Diagnosis not present

## 2017-08-12 DIAGNOSIS — M25571 Pain in right ankle and joints of right foot: Secondary | ICD-10-CM

## 2017-08-12 DIAGNOSIS — R195 Other fecal abnormalities: Secondary | ICD-10-CM

## 2017-08-12 NOTE — Progress Notes (Signed)
Subjective:    Patient ID: Matthew Rowland, male    DOB: 12/24/1945, 72 y.o.   MRN: [id number]  HPI  Pt states 2-3 nights ago he started to get rt ankle irritation almost like he was about to get a cramp. In the morning after first night he felt rt ankle pain on walking.   He also states some bilateral cramping to both hands the other day.   He also states he intermittent hx of looses stools for 2 years.  Pt wonders if he could be dehydrated. He states he had extensive work up from various places as to why he gets the intermittent loose stools. One MD recommended fod mat diet.   Also he states hx of gout 2-3 times in the past. He states last flare maybe last times diagnosed.   Review of Systems  Constitutional: Positive for fatigue. Negative for chills and fever.  Respiratory: Negative for cough, choking, chest tightness, shortness of breath and wheezing.   Cardiovascular: Negative for chest pain and palpitations.  Gastrointestinal: Positive for diarrhea. Negative for abdominal pain, blood in stool, nausea and vomiting.       Last time saw Gi was at [facility] clinic in [place].   Musculoskeletal: Negative for back pain and gait problem.       Rt ankle pain.  Skin: Negative for color change and rash.  Neurological: Negative for dizziness, seizures, syncope, speech difficulty, weakness, light-headedness and headaches.  Hematological: Negative for adenopathy. Does not bruise/bleed easily.  Psychiatric/Behavioral: Negative for agitation, confusion, dysphoric mood and hallucinations. The patient is not nervous/anxious.     Past Medical History:  Diagnosis Date  . A-fib ([place])   . CHF (congestive heart failure) ([place])   . Chronic diarrhea   . Diabetes mellitus without complication (HCC)    borderline  . History of ITP   . Hypertension   . Pancreatitis   . Renal disorder      Social History   Socioeconomic History  . Marital status: Married    Spouse name: Not on file  . Number of  children: Not on file  . Years of education: Not on file  . Highest education level: Not on file  Occupational History  . Not on file  Social Needs  . Financial resource strain: Not on file  . Food insecurity:    Worry: Not on file    Inability: Not on file  . Transportation needs:    Medical: Not on file    Non-medical: Not on file  Tobacco Use  . Smoking status: Never Smoker  . Smokeless tobacco: Never Used  Substance and Sexual Activity  . Alcohol use: No  . Drug use: No  . Sexual activity: Not on file  Lifestyle  . Physical activity:    Days per week: Not on file    Minutes per session: Not on file  . Stress: Not on file  Relationships  . Social connections:    Talks on phone: Not on file    Gets together: Not on file    Attends religious service: Not on file    Active member of club or organization: Not on file    Attends meetings of clubs or organizations: Not on file    Relationship status: Not on file  . Intimate partner violence:    Fear of current or ex partner: Not on file    Emotionally abused: Not on file    Physically abused: Not on file  Forced sexual activity: Not on file  Other Topics Concern  . Not on file  Social History Narrative  . Not on file    Past Surgical History:  Procedure Laterality Date  . ABDOMINAL SURGERY    . CHOLECYSTECTOMY    . KIDNEY TRANSPLANT      No family history on file.  Allergies  Allergen Reactions  . Atorvastatin Itching    Incessant itching which stopped when he stopped the atorvastatin.  Marland Kitchen Lisinopril Other (See Comments)    Causes pancreatitis     Current Outpatient Medications on File Prior to Visit  Medication Sig Dispense Refill  . ACCU-CHEK FASTCLIX LANCETS MISC CHECK GLUCOSE 2 TIMES DAILY. 102 each 2  . diphenoxylate-atropine (LOMOTIL) 2.5-0.025 MG tablet TK 1 T PO TID PRF DH  2  . finasteride (PROSCAR) 5 MG tablet     . glimepiride (AMARYL) 1 MG tablet TAKE 1 TABLET BY MOUTH EVERY DAY BY MOUTH  WITH BREAKFAST 30 tablet 5  . glucose blood (ACCU-CHEK GUIDE) test strip Check glucose 1-2 times daily 100 each 12  . ipratropium (ATROVENT) 0.03 % nasal spray Place 2 sprays into the nose 4 (four) times daily. Use as needed for nasal drainage 30 mL 6  . metoprolol succinate (TOPROL-XL) 100 MG 24 hr tablet Pt is taking 100 mg and 50 mg twice a day for total of 150 mg BID 1 tablet 0  . pantoprazole (PROTONIX) 40 MG tablet Take 40 mg by mouth daily.    . polycarbophil (FIBERCON) 625 MG tablet Take 2 tablets (1,250 mg total) by mouth daily. 30 tablet 0  . prednisoLONE 5 MG TABS tablet Take by mouth.    . rivaroxaban (XARELTO) 20 MG TABS tablet Take 20 mg by mouth daily with supper.    . tacrolimus (PROGRAF) 1 MG capsule Take 1 mg by mouth 2 (two) times daily.    Marland Kitchen ZENPEP 25000 units CPEP      No current facility-administered medications on file prior to visit.     BP 104/72   Pulse 82   Temp 98 F (36.7 C) (Oral)   Resp 16   Ht 6\' 3"  (1.905 m)   Wt 150 lb 12.8 oz (68.4 kg)   SpO2 92%   BMI 18.85 kg/m       Objective:   Physical Exam  General Appearance- Not in acute distress.  HEENT Eyes- Scleraeral/Conjuntiva-bilat- Not Yellow. Mouth & Throat- Normal.  Chest and Lung Exam Auscultation: Breath sounds:-Normal. Adventitious sounds:- No Adventitious sounds.  Cardiovascular Auscultation:Rythm - Regular. Heart Sounds -Normal heart sounds.  Abdomen Inspection:-Inspection Normal.  Palpation/Perucssion: Palpation and Percussion of the abdomen reveal- Non Tender, No Rebound tenderness, No rigidity(Guarding) and No Palpable abdominal masses.  Liver:-Normal.  Spleen:- Normal.   Back- no cva tenderness.   Rt ankle- mild talofibular ligement tenderness.  Right foot- on the bottom aspect near the distal fourth metatarsal he has large callus about 1 cm in size.  Area of callus is dark appearance.  Little bit of yellowish tent as well.      Assessment & Plan:  For history of  cramps rt ankle area and extremity cramps will get xray of rt ankle, cmp, mg and uric acid level.  Will follow these labs and determine if what medication you may needed. Need to review kidney function before decision made.  For fatigue will get cbc  Recommend hydrate next 2-3 days with propel.  Ask GI at [facility] to send [place] copy of  next work up.  Follow up in 10-14 days with pcp or as needed  Referral to podiatrist placed.  Asked referral staff to get him in within 1 week.  Mackie Pai, PA-C

## 2017-08-12 NOTE — Patient Instructions (Addendum)
For history of cramps rt ankle area and extremity cramps will get xray of rt ankle, cmp, mg and uric acid level.  For fatigue will get cbc.  Will follow these labs and determine if what medication you may needed. Need to review kidney function before decision made.  Recommend hydrate next 2-3 days with propel.  Ask GI at [facility] to send [place] copy of next work up.  Follow up in 10-14 days with pcp or as needed

## 2017-08-13 ENCOUNTER — Telehealth: Payer: Self-pay | Admitting: Medical

## 2017-08-13 DIAGNOSIS — R944 Abnormal results of kidney function studies: Secondary | ICD-10-CM

## 2017-08-13 LAB — CBC WITH DIFFERENTIAL/PLATELET
BASOS ABS: 0 10*3/uL (ref 0.0–0.1)
Basophils Relative: 0.6 % (ref 0.0–3.0)
Eosinophils Absolute: 0 10*3/uL (ref 0.0–0.7)
Eosinophils Relative: 0.1 % (ref 0.0–5.0)
HCT: 37.7 % — ABNORMAL LOW (ref 39.0–52.0)
Hemoglobin: 12.4 g/dL — ABNORMAL LOW (ref 13.0–17.0)
LYMPHS ABS: 0.5 10*3/uL — AB (ref 0.7–4.0)
Lymphocytes Relative: 11.6 % — ABNORMAL LOW (ref 12.0–46.0)
MCHC: 33 g/dL (ref 30.0–36.0)
MCV: 94.6 fl (ref 78.0–100.0)
MONO ABS: 0.4 10*3/uL (ref 0.1–1.0)
MONOS PCT: 8.4 % (ref 3.0–12.0)
NEUTROS ABS: 3.5 10*3/uL (ref 1.4–7.7)
Neutrophils Relative %: 79.3 % — ABNORMAL HIGH (ref 43.0–77.0)
PLATELETS: 80 10*3/uL — AB (ref 150.0–400.0)
RBC: 3.98 Mil/uL — ABNORMAL LOW (ref 4.22–5.81)
RDW: 16.7 % — ABNORMAL HIGH (ref 11.5–15.5)
WBC: 4.4 10*3/uL (ref 4.0–10.5)

## 2017-08-13 LAB — URIC ACID: Uric Acid, Serum: 6.6 mg/dL (ref 4.0–7.8)

## 2017-08-13 LAB — COMPREHENSIVE METABOLIC PANEL
ALBUMIN: 3.7 g/dL (ref 3.5–5.2)
ALT: 129 U/L — AB (ref 0–53)
AST: 117 U/L — AB (ref 0–37)
Alkaline Phosphatase: 420 U/L — ABNORMAL HIGH (ref 39–117)
BILIRUBIN TOTAL: 1.4 mg/dL — AB (ref 0.2–1.2)
BUN: 38 mg/dL — AB (ref 6–23)
CHLORIDE: 110 meq/L (ref 96–112)
CO2: 21 mEq/L (ref 19–32)
CREATININE: 2.55 mg/dL — AB (ref 0.40–1.50)
Calcium: 8.6 mg/dL (ref 8.4–10.5)
GFR: 26.58 mL/min — ABNORMAL LOW (ref >60.00)
Glucose, Bld: 176 mg/dL — ABNORMAL HIGH (ref 70–99)
Potassium: 4.9 mEq/L (ref 3.5–5.1)
SODIUM: 140 meq/L (ref 135–145)
TOTAL PROTEIN: 6 g/dL (ref 6.0–8.3)

## 2017-08-13 LAB — MAGNESIUM: MAGNESIUM: 1.8 mg/dL (ref 1.5–2.5)

## 2017-08-13 NOTE — Telephone Encounter (Signed)
Opened to place future cmp.

## 2017-08-16 NOTE — Progress Notes (Addendum)
[place] [facility] [address], [place], [place] [address] [phone]  Date:  08/18/2017   Name:  ARBEN PACKMAN   DOB:  1945/09/03   MRN:  [id number]  PCP:  Darreld Mclean, MD    Chief Complaint: Annual Exam and Diarrhea (chronic diarrhea for 2 years, medication caused)   History of Present Illness:  AKHIL PISCOPO is a 72 y.o. very pleasant male patient who presents with the following:  Here for a CPE today History of CHF, HTN, DM, kidney transplant, SSS, A fib I saw him as a new patient a year ago and in follow-up last September.  From our visit last March:  His cardiologist, GI, nephologyare all at [facility] He needs a PCP to help him in case he were to get sick, and with other more general needs He was born in [place], he had been living in [place] until last fall. Moved to this area in October of last year with his wife to be closer to family. He has a complex PMHx including atrial fib, CAD, CHF, HTN, CKD s/p kidney transplant and past history of pancreatitis  He thinks he first developed his kidney problems when he had pancreatitis and then renal failure back in 2004. He eventually had a transplant in 2014; he got the kidney from his wife.  The kidney is doing pretty well but he does not have a normal creat He has chronic low platelets- this also appearedafter he had the pancreatitis. They think this maybe due to his splenomegaly. Has never been severe enough to cause him any problems  He was first dx with a fib in 2009 he thinks. In 2013 he had an ablation for same; while still in the hospital he developed CP and underwent a cath. He was found to have a serious blockage and underwent a stent.  He had another ablation that also did not work and now just remains in a fib He is generally in a fib, and his rate is usually around 100 per his report His most recent echo was in December 20170 EF 40% He is on xarelto  and troprol He does not have any spontaneous bleeding, but he will bleed and bruise easily between his prednisone and xarelto He continues to have some issues with his pancreatitis. Reports that he has seen a lot of good doctors both here and in [place], and no one has been able to give him a reason for his diarrhea. He is better on lomotil and pancreatic enzymes- he takes the zenpep TID, 3 pills each dose.  He has a history of borderline DM- he is not on any medication for same at this time. He lost a lot of weight when he was so ill, which is good in that he no longer requires insulin His A1c was checked recently and it ws approx 7%. He was on insulin and had a pump actually in the past He does not check his blood sugar at home anymore. Does not have a working meter. Would like to get one He finds that many healthy foods will irritate his stomach. He does best with easier to digest foods but these are often cookies, etc He does have BPH but this is not severe. He would like to try atrovent nasal for his rhinorrhea which can be embarrassing. He notes that his nose will often drip and people will think he is ill, he feels self  conscious about having to wife his noseHe understands that if his urinary sx worsen he should stop this medication   Lab Results  Component Value Date   HGBA1C 10.2 (H) 12/24/2016  we started him back on glimepiride back in September due to high A1c He was also in about a week ago due to cramping in his right ankle  He notes that his ankle pain is now mostly gone He has been trying to hydrate   He does see nephrology Renal function was up a bit at most recent labs  His diarrhea is a bit better  He does see urology and they checked his PSA in the last few months  He continues to have bruising but no spontaneous bleeding He is s/p cholecystectomy- done prior to his renal transplant   Wt Readings from Last 3 Encounters:  08/18/17 154 lb (69.9 kg)  08/12/17 150 lb  12.8 oz (68.4 kg)  12/24/16 153 lb (69.4 kg)   A1c in March at [facility] was 6% Ophthalmology at [facility] Dr. Laurin Coder is his Gi doc at [facility]  Patient Active Problem List   Diagnosis Date Noted  . Squamous cell carcinoma of skin of face 06/02/2017  . Elbow injury, right, subsequent encounter 05/20/2016  . Fracture of right scapular body 05/20/2016  . Chronic combined systolic and diastolic CHF (congestive heart failure) ([place]) 03/29/2016  . Cardiomyopathy ([place]) 03/23/2016  . 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 Essential hypertension 01/15/2016  . H/O acute pancreatitis 01/15/2016  . Immunosuppression ([place]) 01/15/2016  . Type 2 diabetes mellitus with circulatory disorder ([place]) 01/15/2016  . Hx of kidney transplant 01/04/2016  . Folate deficiency 12/08/2015  . Pancytopenia ([place]) 12/08/2015  . CKD (chronic kidney disease) 01/03/2015  . Atrial fibrillation, persistent ([place]) 11/19/2014  . SSS (sick sinus syndrome) ([place]) 02/09/2014  . CAD (coronary artery disease) 02/21/2013    Past Medical History:  Diagnosis Date  . A-fib ([place])   . CHF (congestive heart failure) ([place])   . Chronic diarrhea   . Diabetes mellitus without complication (HCC)    borderline  . History of ITP   . Hypertension   . Pancreatitis   . Renal disorder     Past Surgical History:  Procedure Laterality Date  . ABDOMINAL SURGERY    . CHOLECYSTECTOMY    . KIDNEY TRANSPLANT      Social History   Tobacco Use  . Smoking status: Never Smoker  . Smokeless tobacco: Never Used  Substance Use Topics  . Alcohol use: No  . Drug use: No    No family history on file.  Allergies  Allergen Reactions  . Atorvastatin Itching    Incessant itching which stopped when he stopped the atorvastatin.  Marland Kitchen Lisinopril Other (See Comments)    Causes pancreatitis     Medication list has been reviewed and updated.  Current Outpatient Medications on File Prior to Visit  Medication Sig Dispense  Refill  . ACCU-CHEK FASTCLIX LANCETS MISC CHECK GLUCOSE 2 TIMES DAILY. 102 each 2  . Apixaban (ELIQUIS PO) Take by mouth. Patient does not remember dose    . diphenoxylate-atropine (LOMOTIL) 2.5-0.025 MG tablet TK 1 T PO TID PRF DH  2  . finasteride (PROSCAR) 5 MG tablet     . glimepiride (AMARYL) 1 MG tablet TAKE 1 TABLET BY MOUTH EVERY DAY BY MOUTH WITH BREAKFAST 30 tablet 5  . glucose blood (ACCU-CHEK GUIDE) test strip Check glucose 1-2 times daily 100 each  12  . ipratropium (ATROVENT) 0.03 % nasal spray Place 2 sprays into the nose 4 (four) times daily. Use as needed for nasal drainage 30 mL 6  . metoprolol succinate (TOPROL-XL) 100 MG 24 hr tablet Pt is taking 100 mg and 50 mg twice a day for total of 150 mg BID 1 tablet 0  . Pancrelipase, Lip-Prot-Amyl, (CREON PO) Take by mouth.    . pantoprazole (PROTONIX) 40 MG tablet Take 40 mg by mouth daily.    . polycarbophil (FIBERCON) 625 MG tablet Take 2 tablets (1,250 mg total) by mouth daily. 30 tablet 0  . prednisoLONE 5 MG TABS tablet Take by mouth.    . tacrolimus (PROGRAF) 1 MG capsule Take 1 mg by mouth 2 (two) times daily.    . rivaroxaban (XARELTO) 20 MG TABS tablet Take 20 mg by mouth daily with supper.     No current facility-administered medications on file prior to visit.     Review of Systems:  As per HPI- otherwise negative.   Physical Examination: Vitals:   08/18/17 1503  BP: 116/70  Pulse: 67  Resp: 16  SpO2: 98%   Vitals:   08/18/17 1503  Weight: 154 lb (69.9 kg)  Height: 6\' 3"  (1.905 m)   Body mass index is 19.25 kg/m. Ideal Body Weight: Weight in (lb) to have BMI = 25: 199.6  GEN: WDWN, NAD, Non-toxic, A & O x 3, tall and thin build.  HEENT: Atraumatic, Normocephalic. Neck supple. No masses, No LAD. Ears and Nose: No external deformity. CV: atrial fib with rate in the 70s, No M/G/R. No JVD. No thrill. No extra heart sounds. PULM: CTA B, no wheezes, crackles, rhonchi. No retractions. No resp. distress. No  accessory muscle use. ABD: S, NT, ND, +BS. No rebound. No HSM. EXTR: No c/c/e NEURO Normal gait.  PSYCH: Normally interactive. Conversant. Not depressed or anxious appearing.  Calm demeanor.    Assessment and Plan: Transaminitis - Plan: Hepatic function panel  Controlled type 2 diabetes mellitus with complication, without long-term current use of insulin (HCC)  Paroxysmal atrial fibrillation (HCC)  Urinary incontinence, mixed  Loose stools  Essential hypertension  Hx of kidney transplant  Following up today Noted significant transaminitis on his recent labs.  Will repeat these today.  I am not aware of his having had high LFTs in the past and I really cannot find any old LFTs on his chart.  If still present will discuss with his GI doctor DM is under control with recent A1c of 6 A fib is managed by cardiology, he is on eliquis Rate is ok- toprol BP looks fine - toprol He does continue to be bothered by chronic diarrhea.  I wish that I knew how to resolve this for him    Signed Lamar Blinks, MD  Received his labs 5/3; lebs are better but still high.  Will order a RUQ [place] for him- called pt and let him know  Called [facility] and left a message for his GI doc to call me back  Results for orders placed or performed in visit on 08/18/17  Hepatic function panel  Result Value Ref Range   Total Bilirubin 0.9 0.2 - 1.2 mg/dL   Bilirubin, Direct 0.4 (H) 0.0 - 0.3 mg/dL   Alkaline Phosphatase 401 (H) 39 - 117 U/L   AST 81 (H) 0 - 37 U/L   ALT 75 (H) 0 - 53 U/L   Total Protein 5.9 (L) 6.0 - 8.3 g/dL  Albumin 3.7 3.5 - 5.2 g/dL   5/7- spoke on the phone with Dr. Laurin Coder, his GI doc at [facility].  They had not happened to check his liver function tests in the past. In this case we plan to repeat LFTs in a couple of weeks and I will also run a hep panel at that time.  Called pt and left detailed message with this info.  Order future labs today [place] results:  CLINICAL DATA:  73 year old male  with history of transaminitis.  EXAM: ULTRASOUND ABDOMEN LIMITED RIGHT UPPER QUADRANT COMPARISON:  No priors. FINDINGS: Gallbladder: Status post cholecystectomy. Common bile duct: Diameter: 8 mm Liver: In the right lobe of the liver there is a 1.8 x 1.5 x 2.9 cm anechoic lesion with increased through transmission, compatible with a simple cyst. Elsewhere in the right lobe of the liver there is a 7 x 5 x 5 mm hypoechoic lesion with increased through transmission which is too small to characterize, but is statistically likely to represent a tiny cyst. Minimal intrahepatic biliary ductal dilatation. Within normal limits in parenchymal echogenicity. Portal vein is patent on color Doppler imaging with normal direction of blood flow towards the liver.  IMPRESSION: 1. No acute findings are noted to account for the patient's symptoms. 2. Status post cholecystectomy. Minimal intra and extrahepatic biliary ductal dilatation, likely reflective of benign post cholecystectomy physiology.

## 2017-08-17 DIAGNOSIS — L97511 Non-pressure chronic ulcer of other part of right foot limited to breakdown of skin: Secondary | ICD-10-CM | POA: Diagnosis not present

## 2017-08-18 ENCOUNTER — Ambulatory Visit (INDEPENDENT_AMBULATORY_CARE_PROVIDER_SITE_OTHER): Payer: Medicare Other | Admitting: Family Medicine

## 2017-08-18 ENCOUNTER — Encounter: Payer: Self-pay | Admitting: Family Medicine

## 2017-08-18 VITALS — BP 116/70 | HR 67 | Resp 16 | Ht 75.0 in | Wt 154.0 lb

## 2017-08-18 DIAGNOSIS — N3946 Mixed incontinence: Secondary | ICD-10-CM | POA: Diagnosis not present

## 2017-08-18 DIAGNOSIS — R7401 Elevation of levels of liver transaminase levels: Secondary | ICD-10-CM

## 2017-08-18 DIAGNOSIS — Z940 Kidney transplant status: Secondary | ICD-10-CM

## 2017-08-18 DIAGNOSIS — E118 Type 2 diabetes mellitus with unspecified complications: Secondary | ICD-10-CM

## 2017-08-18 DIAGNOSIS — R740 Nonspecific elevation of levels of transaminase and lactic acid dehydrogenase [LDH]: Secondary | ICD-10-CM | POA: Diagnosis not present

## 2017-08-18 DIAGNOSIS — I480 Paroxysmal atrial fibrillation: Secondary | ICD-10-CM

## 2017-08-18 DIAGNOSIS — I10 Essential (primary) hypertension: Secondary | ICD-10-CM

## 2017-08-18 DIAGNOSIS — R195 Other fecal abnormalities: Secondary | ICD-10-CM

## 2017-08-18 NOTE — Patient Instructions (Signed)
Good to see you again today!  Take care and I will be in touch with your liver function tests- if they are still high I will discuss with your GI doctor   Health Maintenance, Male A healthy lifestyle and preventive care is important for your health and wellness. Ask your health care provider about what schedule of regular examinations is right for you. What should I know about weight and diet? Eat a Healthy Diet  Eat plenty of vegetables, fruits, whole grains, low-fat dairy products, and lean protein.  Do not eat a lot of foods high in solid fats, added sugars, or salt.  Maintain a Healthy Weight Regular exercise can help you achieve or maintain a healthy weight. You should:  Do at least 150 minutes of exercise each week. The exercise should increase your heart rate and make you sweat (moderate-intensity exercise).  Do strength-training exercises at least twice a week.  Watch Your Levels of Cholesterol and Blood Lipids  Have your blood tested for lipids and cholesterol every 5 years starting at 71 years of age. If you are at high risk for heart disease, you should start having your blood tested when you are 71 years old. You may need to have your cholesterol levels checked more often if: ? Your lipid or cholesterol levels are high. ? You are older than 71 years of age. ? You are at high risk for heart disease.  What should I know about cancer screening? Many types of cancers can be detected early and may often be prevented. Lung Cancer  You should be screened every year for lung cancer if: ? You are a current smoker who has smoked for at least 30 years. ? You are a former smoker who has quit within the past 15 years.  Talk to your health care provider about your screening options, when you should start screening, and how often you should be screened.  Colorectal Cancer  Routine colorectal cancer screening usually begins at 71 years of age and should be repeated every 5-10 years  until you are 71 years old. You may need to be screened more often if early forms of precancerous polyps or small growths are found. Your health care provider may recommend screening at an earlier age if you have risk factors for colon cancer.  Your health care provider may recommend using home test kits to check for hidden blood in the stool.  A small camera at the end of a tube can be used to examine your colon (sigmoidoscopy or colonoscopy). This checks for the earliest forms of colorectal cancer.  Prostate and Testicular Cancer  Depending on your age and overall health, your health care provider may do certain tests to screen for prostate and testicular cancer.  Talk to your health care provider about any symptoms or concerns you have about testicular or prostate cancer.  Skin Cancer  Check your skin from head to toe regularly.  Tell your health care provider about any new moles or changes in moles, especially if: ? There is a change in a mole's size, shape, or color. ? You have a mole that is larger than a pencil eraser.  Always use sunscreen. Apply sunscreen liberally and repeat throughout the day.  Protect yourself by wearing long sleeves, pants, a wide-brimmed hat, and sunglasses when outside.  What should I know about heart disease, diabetes, and high blood pressure?  If you are 58-2 years of age, have your blood pressure checked every 3-5 years. If  you are 59 years of age or older, have your blood pressure checked every year. You should have your blood pressure measured twice-once when you are at a hospital or clinic, and once when you are not at a hospital or clinic. Record the average of the two measurements. To check your blood pressure when you are not at a hospital or clinic, you can use: ? An automated blood pressure machine at a pharmacy. ? A home blood pressure monitor.  Talk to your health care provider about your target blood pressure.  If you are between 62-12  years old, ask your health care provider if you should take aspirin to prevent heart disease.  Have regular diabetes screenings by checking your fasting blood sugar level. ? If you are at a normal weight and have a low risk for diabetes, have this test once every three years after the age of 66. ? If you are overweight and have a high risk for diabetes, consider being tested at a younger age or more often.  A one-time screening for abdominal aortic aneurysm (AAA) by ultrasound is recommended for men aged 10-75 years who are current or former smokers. What should I know about preventing infection? Hepatitis B If you have a higher risk for hepatitis B, you should be screened for this virus. Talk with your health care provider to find out if you are at risk for hepatitis B infection. Hepatitis C Blood testing is recommended for:  Everyone born from [birth year] through 1964.  Anyone with known risk factors for hepatitis C.  Sexually Transmitted Diseases (STDs)  You should be screened each year for STDs including gonorrhea and chlamydia if: ? You are sexually active and are younger than 71 years of age. ? You are older than 72 years of age and your health care provider tells you that you are at risk for this type of infection. ? Your sexual activity has changed since you were last screened and you are at an increased risk for chlamydia or gonorrhea. Ask your health care provider if you are at risk.  Talk with your health care provider about whether you are at high risk of being infected with HIV. Your health care provider may recommend a prescription medicine to help prevent HIV infection.  What else can I do?  Schedule regular health, dental, and eye exams.  Stay current with your vaccines (immunizations).  Do not use any tobacco products, such as cigarettes, chewing tobacco, and e-cigarettes. If you need help quitting, ask your health care provider.  Limit alcohol intake to no more than 2  drinks per day. One drink equals 12 ounces of beer, 5 ounces of wine, or 1 ounces of hard liquor.  Do not use street drugs.  Do not share needles.  Ask your health care provider for help if you need support or information about quitting drugs.  Tell your health care provider if you often feel depressed.  Tell your health care provider if you have ever been abused or do not feel safe at home. This information is not intended to replace advice given to you by your health care provider. Make sure you discuss any questions you have with your health care provider. Document Released: 10/03/2007 Document Revised: 12/04/2015 Document Reviewed: 01/08/2015 Elsevier Interactive Patient Education  [employer].

## 2017-08-19 LAB — HEPATIC FUNCTION PANEL
ALBUMIN: 3.7 g/dL (ref 3.5–5.2)
ALT: 75 U/L — ABNORMAL HIGH (ref 0–53)
AST: 81 U/L — AB (ref 0–37)
Alkaline Phosphatase: 401 U/L — ABNORMAL HIGH (ref 39–117)
Bilirubin, Direct: 0.4 mg/dL — ABNORMAL HIGH (ref 0.0–0.3)
Total Bilirubin: 0.9 mg/dL (ref 0.2–1.2)
Total Protein: 5.9 g/dL — ABNORMAL LOW (ref 6.0–8.3)

## 2017-08-20 NOTE — Addendum Note (Signed)
Addended by: Lamar Blinks C on: 08/20/2017 01:31 PM   Modules accepted: Orders

## 2017-08-21 ENCOUNTER — Ambulatory Visit (HOSPITAL_BASED_OUTPATIENT_CLINIC_OR_DEPARTMENT_OTHER)
Admission: RE | Admit: 2017-08-21 | Discharge: 2017-08-21 | Disposition: A | Discharge status: Home / Self Care | Payer: Medicare Other | Source: Ambulatory Visit | Attending: Medical | Admitting: Medical

## 2017-08-21 ENCOUNTER — Ambulatory Visit (HOSPITAL_BASED_OUTPATIENT_CLINIC_OR_DEPARTMENT_OTHER)
Admission: RE | Admit: 2017-08-21 | Discharge: 2017-08-21 | Disposition: A | Discharge status: Home / Self Care | Payer: Medicare Other | Source: Ambulatory Visit | Attending: Family Medicine | Admitting: Family Medicine

## 2017-08-21 DIAGNOSIS — R7401 Elevation of levels of liver transaminase levels: Secondary | ICD-10-CM

## 2017-08-21 DIAGNOSIS — I70208 Unspecified atherosclerosis of native arteries of extremities, other extremity: Secondary | ICD-10-CM | POA: Insufficient documentation

## 2017-08-21 DIAGNOSIS — M7989 Other specified soft tissue disorders: Secondary | ICD-10-CM | POA: Diagnosis not present

## 2017-08-21 DIAGNOSIS — M25571 Pain in right ankle and joints of right foot: Secondary | ICD-10-CM

## 2017-08-21 DIAGNOSIS — Z9049 Acquired absence of other specified parts of digestive tract: Secondary | ICD-10-CM | POA: Insufficient documentation

## 2017-08-21 DIAGNOSIS — K838 Other specified diseases of biliary tract: Secondary | ICD-10-CM | POA: Diagnosis not present

## 2017-08-21 DIAGNOSIS — R740 Nonspecific elevation of levels of transaminase and lactic acid dehydrogenase [LDH]: Secondary | ICD-10-CM | POA: Diagnosis not present

## 2017-08-24 ENCOUNTER — Encounter: Payer: Self-pay | Admitting: Family Medicine

## 2017-08-24 ENCOUNTER — Telehealth: Payer: Self-pay | Admitting: Family Medicine

## 2017-08-24 NOTE — Telephone Encounter (Signed)
Called pt back- his LFTs have trended down overall which is good abd Korea was benign  I am still trying to contact his GI doc at [facility]- Dr. Laurin Coder, he is one of the fellows.  Have called Friday and again today and am hoping for a call back Pt is actually seeing Dr. Bobby Rumpf tomorrow at [facility]- he is his "pancreas specialist" but pt will ask him about LFts as well.   Pt also asks about ankle film recently ordered by Percell Miller.  Went over this- benign.  Ankle pain for about 2 weeks.  He will let me know if this persists and if so I can refer to ortho for him  Dg Ankle Complete Right  Result Date: 08/21/2017 CLINICAL DATA:  Right ankle pain for 2 weeks. EXAM: RIGHT ANKLE - COMPLETE 3+ VIEW COMPARISON:  None. FINDINGS: There is no evidence of fracture, dislocation, or joint effusion. There is no evidence of arthropathy or other focal bone abnormality. Extensive vascular calcifications. Diffuse mild soft tissue swelling about the ankle. IMPRESSION: No acute fracture or dislocation identified about the right ankle. Extensive vascular calcifications. Diffuse mile soft tissue swelling about the ankle. Electronically Signed   By: Fidela Salisbury M.D.   On: 08/21/2017 15:05   US Abdomen Limited Ruq  Result Date: 08/21/2017 CLINICAL DATA:  72 year old male with history of transaminitis. EXAM: ULTRASOUND ABDOMEN LIMITED RIGHT UPPER QUADRANT COMPARISON:  No priors. FINDINGS: Gallbladder: Status post cholecystectomy. Common bile duct: Diameter: 8 mm Liver: In the right lobe of the liver there is a 1.8 x 1.5 x 2.9 cm anechoic lesion with increased through transmission, compatible with a simple cyst. Elsewhere in the right lobe of the liver there is a 7 x 5 x 5 mm hypoechoic lesion with increased through transmission which is too small to characterize, but is statistically likely to represent a tiny cyst. Minimal intrahepatic biliary ductal dilatation. Within normal limits in parenchymal echogenicity. Portal vein is patent on  color Doppler imaging with normal direction of blood flow towards the liver. IMPRESSION: 1. No acute findings are noted to account for the patient's symptoms. 2. Status post cholecystectomy. Minimal intra and extrahepatic biliary ductal dilatation, likely reflective of benign post cholecystectomy physiology. Electronically Signed   By: Vinnie Langton M.D.   On: 08/21/2017 16:28

## 2017-08-24 NOTE — Addendum Note (Signed)
Addended by: Lamar Blinks C on: 08/24/2017 07:35 PM   Modules accepted: Orders

## 2017-08-24 NOTE — Telephone Encounter (Signed)
Copied from [place] [id number]. Topic: Quick Communication - See Telephone Encounter >> Aug 24, 2017  2:26 PM Ether Griffins B wrote: CRM for notification. See Telephone encounter for: 08/24/17.  Pt calling about results on [place] and xray done 08/21/17. CB# [phone]

## 2017-08-25 DIAGNOSIS — R945 Abnormal results of liver function studies: Secondary | ICD-10-CM | POA: Diagnosis not present

## 2017-08-25 DIAGNOSIS — Z940 Kidney transplant status: Secondary | ICD-10-CM | POA: Diagnosis not present

## 2017-08-25 DIAGNOSIS — K863 Pseudocyst of pancreas: Secondary | ICD-10-CM | POA: Diagnosis not present

## 2017-08-25 DIAGNOSIS — Z8719 Personal history of other diseases of the digestive system: Secondary | ICD-10-CM | POA: Diagnosis not present

## 2017-08-25 DIAGNOSIS — D899 Disorder involving the immune mechanism, unspecified: Secondary | ICD-10-CM | POA: Diagnosis not present

## 2017-08-27 NOTE — Telephone Encounter (Signed)
Pt called stating Dr. Bobby Rumpf found info from liver tests back in 2017 at [facility]. Pt states that his level was 167 in 2017 and that when he saw Percell Miller it was around 400. Pt states Dr. Lorelei Pont wanted him to call back to follow up. He said if records are needed before 04/2016 it would be thru [place].

## 2017-08-28 NOTE — Telephone Encounter (Signed)
Ok- called pt back. Asked him to come in for repeat labs as planned in about 2 weeks- liver function and hep panel.  We already got an Korea which was benign.  If his LFTs don't normalize will have him follow-up with GI

## 2017-08-31 ENCOUNTER — Telehealth: Payer: Self-pay

## 2017-08-31 DIAGNOSIS — M25571 Pain in right ankle and joints of right foot: Principal | ICD-10-CM

## 2017-08-31 DIAGNOSIS — G8929 Other chronic pain: Secondary | ICD-10-CM

## 2017-08-31 NOTE — Telephone Encounter (Signed)
Copied from [place] [id number]. Topic: Referral - Request >> Aug 30, 2017 11:41 AM Neva Seat wrote: Pt is requesting to go ahead with the orthopedic referral he and Dr. Lorelei Pont discussed.

## 2017-08-31 NOTE — Telephone Encounter (Signed)
Patient had ankle xray done. Per Mackie Pai patient may benefit from following up with podiatrist but I do not see anything about an orthopedic specialist referral. Please advise.

## 2017-08-31 NOTE — Telephone Encounter (Signed)
Taken care of

## 2017-08-31 NOTE — Addendum Note (Signed)
Addended by: Lamar Blinks C on: 08/31/2017 02:42 PM   Modules accepted: Orders

## 2017-09-01 DIAGNOSIS — L97511 Non-pressure chronic ulcer of other part of right foot limited to breakdown of skin: Secondary | ICD-10-CM | POA: Diagnosis not present

## 2017-09-01 DIAGNOSIS — M25571 Pain in right ankle and joints of right foot: Secondary | ICD-10-CM | POA: Diagnosis not present

## 2017-09-03 ENCOUNTER — Encounter (INDEPENDENT_AMBULATORY_CARE_PROVIDER_SITE_OTHER): Payer: Self-pay | Admitting: Surgery

## 2017-09-03 ENCOUNTER — Ambulatory Visit (INDEPENDENT_AMBULATORY_CARE_PROVIDER_SITE_OTHER): Payer: Medicare Other

## 2017-09-03 ENCOUNTER — Emergency Department (HOSPITAL_BASED_OUTPATIENT_CLINIC_OR_DEPARTMENT_OTHER): Admit: 2017-09-03 | Discharge: 2017-09-03 | Disposition: A | Discharge status: Home / Self Care | Payer: Medicare Other

## 2017-09-03 ENCOUNTER — Encounter (HOSPITAL_COMMUNITY): Payer: Self-pay

## 2017-09-03 ENCOUNTER — Emergency Department (HOSPITAL_COMMUNITY)
Admission: EM | Admit: 2017-09-03 | Discharge: 2017-09-04 | Discharge status: Against Medical Advice | Payer: Medicare Other | Attending: Emergency Medicine | Admitting: Emergency Medicine

## 2017-09-03 ENCOUNTER — Ambulatory Visit (INDEPENDENT_AMBULATORY_CARE_PROVIDER_SITE_OTHER): Payer: Medicare Other | Admitting: Surgery

## 2017-09-03 ENCOUNTER — Emergency Department (HOSPITAL_COMMUNITY): Payer: Medicare Other

## 2017-09-03 ENCOUNTER — Other Ambulatory Visit: Payer: Self-pay

## 2017-09-03 VITALS — BP 104/65 | HR 66 | Ht 75.0 in | Wt 142.0 lb

## 2017-09-03 DIAGNOSIS — I739 Peripheral vascular disease, unspecified: Secondary | ICD-10-CM

## 2017-09-03 DIAGNOSIS — M25571 Pain in right ankle and joints of right foot: Secondary | ICD-10-CM | POA: Diagnosis not present

## 2017-09-03 DIAGNOSIS — Z5321 Procedure and treatment not carried out due to patient leaving prior to being seen by health care provider: Secondary | ICD-10-CM | POA: Insufficient documentation

## 2017-09-03 DIAGNOSIS — M7989 Other specified soft tissue disorders: Secondary | ICD-10-CM | POA: Diagnosis not present

## 2017-09-03 DIAGNOSIS — M79671 Pain in right foot: Secondary | ICD-10-CM | POA: Diagnosis not present

## 2017-09-03 DIAGNOSIS — R600 Localized edema: Secondary | ICD-10-CM | POA: Diagnosis not present

## 2017-09-03 DIAGNOSIS — I70213 Atherosclerosis of native arteries of extremities with intermittent claudication, bilateral legs: Secondary | ICD-10-CM

## 2017-09-03 LAB — CBC WITH DIFFERENTIAL/PLATELET
BAND NEUTROPHILS: 3 %
BASOS PCT: 0 %
Basophils Absolute: 0 10*3/uL (ref 0.0–0.1)
Blasts: 0 %
EOS PCT: 0 %
Eosinophils Absolute: 0 10*3/uL (ref 0.0–0.7)
HCT: 37.4 % — ABNORMAL LOW (ref 39.0–52.0)
Hemoglobin: 11.7 g/dL — ABNORMAL LOW (ref 13.0–17.0)
LYMPHS ABS: 0.9 10*3/uL (ref 0.7–4.0)
Lymphocytes Relative: 18 %
MCH: 29.8 pg (ref 26.0–34.0)
MCHC: 31.3 g/dL (ref 30.0–36.0)
MCV: 95.2 fL (ref 78.0–100.0)
METAMYELOCYTES PCT: 1 %
MONOS PCT: 19 %
Monocytes Absolute: 0.9 10*3/uL (ref 0.1–1.0)
Myelocytes: 0 %
NEUTROS ABS: 3.1 10*3/uL (ref 1.7–7.7)
NEUTROS PCT: 59 %
Other: 0 %
PLATELETS: 107 10*3/uL — AB (ref 150–400)
Promyelocytes Relative: 0 %
RBC: 3.93 MIL/uL — AB (ref 4.22–5.81)
RDW: 15.7 % — AB (ref 11.5–15.5)
Smear Review: DECREASED
WBC: 4.9 10*3/uL (ref 4.0–10.5)
nRBC: 0 /100 WBC

## 2017-09-03 LAB — BASIC METABOLIC PANEL
ANION GAP: 6 (ref 5–15)
BUN: 36 mg/dL — AB (ref 6–20)
CALCIUM: 8.7 mg/dL — AB (ref 8.9–10.3)
CO2: 20 mmol/L — ABNORMAL LOW (ref 22–32)
Chloride: 111 mmol/L (ref 101–111)
Creatinine, Ser: 2.48 mg/dL — ABNORMAL HIGH (ref 0.61–1.24)
GFR calc Af Amer: 28 mL/min — ABNORMAL LOW (ref >60)
GFR, EST NON AFRICAN AMERICAN: 25 mL/min — AB (ref >60)
Glucose, Bld: 61 mg/dL — ABNORMAL LOW (ref 65–99)
Potassium: 4.7 mmol/L (ref 3.5–5.1)
Sodium: 137 mmol/L (ref 135–145)

## 2017-09-03 LAB — C-REACTIVE PROTEIN: CRP: 1.2 mg/dL — ABNORMAL HIGH (ref <1.0)

## 2017-09-03 LAB — SEDIMENTATION RATE: Sed Rate: 11 mm/hr (ref 0–16)

## 2017-09-03 NOTE — Progress Notes (Signed)
Office Visit Note   Patient: Matthew Rowland           Date of Birth: 09-May-1945           MRN: [id number] Visit Date: 09/03/2017              Requested by: Matthew Mclean, MD [address] [place], [place] [address] PCP: Matthew Mclean, MD   Assessment & Plan: Visit Diagnoses:  1. Pain in right foot   2. Claudication of right lower extremity ([place])     Plan:   I discussed patient's case with Dr. Alphonzo Severance today during my visit with patient.  I am very concerned about the acute nature of his worsening right ankle/foot pain and cramping.  He has also been describing worsening right lower extremity claudication symptoms from the calf down to his foot the last 3 weeks.  He denies any problems with this before onset.  With his increased pain and poor wound healing the plantar aspect of his right fifth metatarsal head we must rule out osteomyelitis there.  Dr. Marlou Sa and I agreed that patient should be seen by the vascular surgeon today and our assistant did try to contact their office to get patient seen urgently in their clinic this afternoon but they stated that they did not have any appointments available.  We recommend that patient be evaluated in the emergency room and the vascular surgery office did state that their on-call physician would see patient in the ER when consulted.  Patient does need ABI studies.  We will also order MRI right foot to rule out osteomyelitis of the distal fifth metatarsal.  I did stress to patient and his wife the importance of going immediately to the emergency room as recommended this afternoon.  I did call the emergency room and advised the charge nurse that patient would be immediately on his way and that the vascular surgeon needed to be consulted.  Follow-Up Instructions: Return in about 2 weeks (around 09/17/2017) for With Dr. Sharol Given to recheck right ankle/foot and go over ABI study.   Orders:  Orders Placed This Encounter  Procedures  .  XR Foot Complete Right   No orders of the defined types were placed in this encounter.     Procedures: No procedures performed   Clinical Data: No additional findings.   Subjective: Chief Complaint  Patient presents with  . Right Ankle - Pain    HPI 72 year old white male who is new patient to the office comes in with complaints of right ankle pain and right foot pain.  Patient states that he has had chronic right foot pain that has been off and on for the last year and is currently under the care of a podiatrist in [facility].  He had problems with a painful callus in the plantar aspect of the fifth MTP joint.  It sounds like this goes back and forth from being an ulcer to a callus with periods of drainage.  States that the podiatrist intermittently debrides the area.  He does admit to a history of diabetic neuropathy.  Patient has been referred by his primary care physician and over the last 3 weeks he has had increased problems with right calf/ankle and foot cramping.  This is worse when he is ambulating.  Has had some swelling.  Does not have the same symptoms in the left leg.  Patient states that he has history of gout but recently had  uric acid level checked and this was within normal limits. Review of Systems No current cardiac pulmonary GI GU issues  Objective: Vital Signs: BP 104/65 (BP Location: Left Arm, Patient Position: Sitting, Cuff Size: Normal)   Pulse 66   Ht 6\' 3"  (1.905 m)   Wt 142 lb (64.4 kg)   BMI 17.75 kg/m   Physical Exam  Constitutional: He is oriented to person, place, and time. No distress.  HENT:  Head: Normocephalic and atraumatic.  Eyes: Pupils are equal, round, and reactive to light.  Neck: Normal range of motion.  Pulmonary/Chest: No respiratory distress.  Musculoskeletal:  Toes on right foot are somewhat purple.  Pedal pulses bilateral feet are nonpalpable.  With Doppler in the clinic I was not able to pick up a posterior tibial pulse right  foot.  Other foot pulses abnormal.  Ankle joint is tender on the right.  Right foot he is exquisitely tender at the fifth metatarsal head where he does have a callus plantar foot.  From the callus area there is slight serous drainage.  Neurological: He is alert and oriented to person, place, and time.  Skin:  Right foot is somewhat cool to touch.  Psychiatric: He has a normal mood and affect.    Ortho Exam  Specialty Comments:  No specialty comments available.  Imaging: No results found.   PMFS History: Patient Active Problem List   Diagnosis Date Noted  . Squamous cell carcinoma of skin of face 06/02/2017  . Elbow injury, right, subsequent encounter 05/20/2016  . Fracture of right scapular body 05/20/2016  . Chronic combined systolic and diastolic CHF (congestive heart failure) ([place]) 03/29/2016  . Cardiomyopathy ([place]) 03/23/2016  . 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 Essential hypertension 01/15/2016  . H/O acute pancreatitis 01/15/2016  . Immunosuppression ([place]) 01/15/2016  . Type 2 diabetes mellitus with circulatory disorder ([place]) 01/15/2016  . Hx of kidney transplant 01/04/2016  . Folate deficiency 12/08/2015  . Pancytopenia ([place]) 12/08/2015  . CKD (chronic kidney disease) 01/03/2015  . Atrial fibrillation, persistent ([place]) 11/19/2014  . SSS (sick sinus syndrome) ([place]) 02/09/2014  . CAD (coronary artery disease) 02/21/2013   Past Medical History:  Diagnosis Date  . A-fib ([place])   . CHF (congestive heart failure) ([place])   . Chronic diarrhea   . Diabetes mellitus without complication (HCC)    borderline  . History of ITP   . Hypertension   . Pancreatitis   . Renal disorder     History reviewed. No pertinent family history.  Past Surgical History:  Procedure Laterality Date  . ABDOMINAL SURGERY    . CHOLECYSTECTOMY    . KIDNEY TRANSPLANT     Social History   Occupational History  . Not on file  Tobacco Use  . Smoking status:  Never Smoker  . Smokeless tobacco: Never Used  Substance and Sexual Activity  . Alcohol use: No  . Drug use: No  . Sexual activity: Not on file

## 2017-09-03 NOTE — Progress Notes (Addendum)
*  Preliminary Results* Right lower extremity venous duplex completed. Right lower extremity is negative for deep vein thrombosis. There is no evidence of right Baker's cyst.  ARTERIAL ABI completed:   RIGHT    LEFT    PRESSURE WAVEFORM  PRESSURE WAVEFORM  BRACHIAL 101 Triphasic BRACHIAL 126 Triphasic  DP Noncompressible Biphasic DP Noncompressible Biphasic  AT   AT    PT 172 Dampened monophasic PT 47 Monophasic  PER   PER    GREAT TOE 57 NA GREAT TOE 61 NA    RIGHT LEFT  ABI 1.37 0.37  TBI 0.45 0.48   The right ABI is elevated, suggestive of medial arterial calcification. The left ABI is suggestive of severe arterial insufficiency. Bilateral TBIs are abnormal.  Preliminary results discussed with Raquel Sarna, RN.  09/03/2017 7:05 PM Maudry Mayhew, BS, RVT, RDCS, RDMS

## 2017-09-03 NOTE — ED Triage Notes (Signed)
Pt c/o pain and swelling in right leg X3 weeks. Pt reports he has been seen by ortho and they advised he come to ED to get a vascular study.

## 2017-09-03 NOTE — ED Provider Notes (Signed)
Patient placed in Quick Look pathway, seen and evaluated   Chief Complaint: sent from orthopedist for R ankle pain  HPI:   Pt is a 72 y.o. male with a PMHx of afib on eliquis/xarelto (both listed in MAR), ITP, DM2, CHF, HTN, and prior renal transplant, presenting today with c/o R ankle pain x3 weeks.  Patient states that he was seen at [facility] today for complaint of 3 weeks of right ankle pain.  Per chart review, PA-C Ricard Dillon saw him and wanted to get MRI R foot to r/o osteomyelitis due to a chronic R 5th metatarsal head wound that hasn't been healing, but also wanted him to be seen by vascular surgery because of concern for possible claudication; vascular surgery couldn't see him today so they advised to send him here for quicker evaluation. They ordered his MRI R foot as well as ABIs but neither has been done prior to coming here today.  Patient states that the wound on his fifth metatarsal head area has been there for about a year, states that it is a callus that his podiatrist will sometimes shave down and occasionally he will have some bloody drainage, but denies any purulent drainage or any worsening of the wound.  He states that the right ankle pain started 3 weeks ago and he is noticed some swelling.  He is noticed that his skin is darker and somewhat redder near his ankle but denies any frank erythema or warmth.  He denies any claudication, fevers, chest pain, shortness of breath, new or worsening numbness or tingling, focal weakness, or any other complaints at this time.  Denies any injury or trauma to his ankle.  Has a remote history of gout but states that he changed his diet significantly and has not had any issues in a long time related to gout.  ROS: +R ankle pain/swelling, +wound R foot. No erythema/warmth/drainage,  claudication, fevers, chest pain, shortness of breath, new or worsening numbness or tingling, or focal weakness  Physical Exam:  BP 100/72 (BP Location: Right Arm)    Pulse 73   Temp 98.4 F (36.9 C)   Resp 14   Ht 6\' 3"  (1.905 m)   Wt 64.4 kg (142 lb)   SpO2 100%   BMI 17.75 kg/m    Gen: No distress  Neuro: Awake and Alert  Skin: Warm    Focused Exam: callus to R 5th metatarsal head area of the foot without ulceration, no bleeding or drainage, no erythema or warmth around the area. No fluctuance. Mild TTP to R ankle around lateral malleolus, no crepitus or deformity. B/l lower legs with brawny discoloration up to mid-calf, but no frank erythema, no warmth. 1+ edema to RLE up to mid-calf. Neg homan's sign. R foot and ankle with normal temperature, no poikilothermia or abnormal coolness. DP pulse hard to palpate but strong pulse found with doppler. NVI with soft compartments.    Initiation of care has begun. The patient has been counseled on the process, plan, and necessity for staying for the completion/evaluation, and the remainder of the medical screening examination. Please note, I have ordered the R foot MRI to be done here since these appear to have been ordered for outpatient imaging at [facility]; I also ordered the ABIs for here as well.     [address], [place], [place] 09/03/17 [place], MD 09/03/17 2239

## 2017-09-04 NOTE — ED Provider Notes (Signed)
Radiologist call to notify the MRI of R foot showing no evidence of osteomyelitis, but did show R ankle joint effusion and moderate edema to the plantar aspect of the foot.   I have not personally evaluate or care for this patient.     Domenic Moras, PA-C 09/04/17 0026    Ward, Delice Bison, DO 09/04/17 [id number]

## 2017-09-06 ENCOUNTER — Telehealth: Payer: Self-pay | Admitting: Family Medicine

## 2017-09-06 ENCOUNTER — Telehealth (INDEPENDENT_AMBULATORY_CARE_PROVIDER_SITE_OTHER): Payer: Self-pay | Admitting: Surgery

## 2017-09-06 NOTE — Telephone Encounter (Signed)
Called him back at 4:45- he was seen by ortho on Friday and instructed to go to the ER for evaluation and to see vascular surgery.  However he left without being seen due to long wait.  Today he notes that he continues to have a lot of pain in his right foot and he is not able to sleep due to his pain   He was seen by ortho on 5/17; was told to go to the ER due to concern of possible developing acute arterial insufficiency of his right foot.  He had ABIs as follows: Final Interpretation: Right: The right toe-brachial index is abnormal. The right ABI is elevated, suggestive of medial arterial calcification. Left: The left toe-brachial index is abnormal. The left ABI is suggestive of severe arterial insufficiency.  No DVT noted MRI right foot: IMPRESSION: 1. Soft tissue edema and myositis of the included foot. Differential possibilities may include third spacing of fluid, stigmata of venous insufficiency or cellulitis accounting for this appearance. 2. No evidence of osteomyelitis, fracture or joint dislocation. 3. Subtalar and midfoot osteoarthritis.  Called and spoke with NP at [facility] - they will be able to see pt in the next day or so. They are not able to determine if he may have acute arterial insuf from his ABI alone.  They took pt info and will plan to get him seen tomorrow  Also spoke with on call doc at [facility] about his MRI- they don't think that this is osteomyelitis most likely, and would recommend that he see vascular first.  However if not vascular they are glad to see pt Called pt back at 5:20 and he did not pick up.  Left detailed message that the plan is for him to see vascular tomorrow, gave him their number and asked him to call them first thing in the am.  However I did express on my message that I have not seen this foot, I don't know how it looks and if he might be in danger of losing the foot.  If he is having a lot of pain it is possible that the foot could  be infarcting. He may also go to the ER now for urgent evaluation

## 2017-09-06 NOTE — Telephone Encounter (Signed)
Patient called wanting someone to look at his test results from last Friday when [facility] sent him to the ER to get a lot of tests done. CB # [phone]

## 2017-09-06 NOTE — Telephone Encounter (Signed)
See note from patient

## 2017-09-06 NOTE — Telephone Encounter (Signed)
Copied from [place] [phone]. Topic: Quick Communication - See Telephone Encounter >> Sep 06, 2017  2:34 PM Rutherford Nail, NT wrote: CRM for notification. See Telephone encounter for: 09/06/17. Patient calling and states that he was sent to the ER on Friday (09/03/17) from  his orthopedic for more testing. States that he had waited for 10.5+ hours, went home at 2:15am, and wanted to know if someone can go over his results with him from that night. States taht he was calling his PCP because he had not heard anything from orthopedic yet. Please advise. CB#: [phone]

## 2017-09-06 NOTE — Telephone Encounter (Signed)
Called pt and was able to speak to him.  He does not wish to go to the ER tonight but will be sure to see vascular tomorrow

## 2017-09-07 ENCOUNTER — Encounter: Payer: Self-pay | Admitting: Vascular Surgery

## 2017-09-07 ENCOUNTER — Ambulatory Visit (INDEPENDENT_AMBULATORY_CARE_PROVIDER_SITE_OTHER): Payer: Medicare Other | Admitting: Vascular Surgery

## 2017-09-07 ENCOUNTER — Telehealth: Payer: Self-pay | Admitting: Family Medicine

## 2017-09-07 ENCOUNTER — Other Ambulatory Visit: Payer: Self-pay

## 2017-09-07 VITALS — BP 128/84 | HR 119 | Temp 97.5°F | Resp 16 | Ht 75.0 in | Wt 148.0 lb

## 2017-09-07 DIAGNOSIS — M25572 Pain in left ankle and joints of left foot: Secondary | ICD-10-CM

## 2017-09-07 NOTE — Telephone Encounter (Signed)
Called [facility] and was able to get pt seen today. Appreciate their help with this pt  Called pt and let him know- he will proceed to the [address] location now

## 2017-09-07 NOTE — Telephone Encounter (Signed)
Copied from [place] [id number]. Topic: Referral - Request >> Sep 07, 2017  9:10 AM Robina Ade, Helene Kelp D wrote: Reason for CRM: Patient called and said that he would like to talk to Dr. Lorelei Pont about his referral to vascular. They can not see him until 10/05/17. He wants to be seen before that.

## 2017-09-07 NOTE — Telephone Encounter (Signed)
Copied from [place] [id number]. Topic: Referral - Request >> Sep 07, 2017  9:10 AM Robina Ade, Helene Kelp D wrote: Reason for CRM: Patient called and said that he would like to talk to Dr. Lorelei Pont about his referral to vascular. They can not see him until 10/05/17. He wants to be seen before that.  >> Sep 07, 2017  9:53 AM Vernona Rieger wrote: Ebony Hail from [facility] called and wanted to let Dr Lorelei Pont know that the first available appointment will not be until 7/3 now because he canceled the 6/26 appointment. She just wanted to let Dr Lorelei Pont know if she needs him to be sooner that he will need to go to another office. Call back if needed [phone] ask for [facility].

## 2017-09-07 NOTE — Progress Notes (Signed)
[facility] Specialist of [place]  Patient name: Matthew Rowland MRN: [id number] DOB: [birth year] Sex: male  REASON FOR CONSULT: Urgent office evaluation regarding right foot with potential severe ischemia  HPI: DAVINDER HAFF is a 72 y.o. male, who is seen at the request of Dr. Lorelei Pont urgently with possible critical limb ischemia.  He has a extensive past medical history to include chronic atrial fibrillation diabetes renal transplant.  He has a several week history of pain with erythema mainly in the medial malleolus of his right leg.  He had presented to the emergency room for evaluation of this on 09/03/2017.  He did undergo some testing and then left the emergency room without resolution.  He underwent MRI of his right foot and also arterial and venous right lower extremity vascular studies.  He reports pain in this area and his ankle with a burning sensation.  This is not related to walking.  No tissue loss.  Past Medical History:  Diagnosis Date  . A-fib ([place])   . CHF (congestive heart failure) ([place])   . Chronic diarrhea   . Diabetes mellitus without complication (HCC)    borderline  . History of ITP   . Hypertension   . Pancreatitis   . Renal disorder     History reviewed. No pertinent family history.  SOCIAL HISTORY: Social History   Socioeconomic History  . Marital status: Married    Spouse name: Not on file  . Number of children: Not on file  . Years of education: Not on file  . Highest education level: Not on file  Occupational History  . Not on file  Social Needs  . Financial resource strain: Not on file  . Food insecurity:    Worry: Not on file    Inability: Not on file  . Transportation needs:    Medical: Not on file    Non-medical: Not on file  Tobacco Use  . Smoking status: Never Smoker  . Smokeless tobacco: Never Used  Substance and Sexual Activity  . Alcohol use: No  . Drug use: No  . Sexual activity: Not  on file  Lifestyle  . Physical activity:    Days per week: Not on file    Minutes per session: Not on file  . Stress: Not on file  Relationships  . Social connections:    Talks on phone: Not on file    Gets together: Not on file    Attends religious service: Not on file    Active member of club or organization: Not on file    Attends meetings of clubs or organizations: Not on file    Relationship status: Not on file  . Intimate partner violence:    Fear of current or ex partner: Not on file    Emotionally abused: Not on file    Physically abused: Not on file    Forced sexual activity: Not on file  Other Topics Concern  . Not on file  Social History Narrative  . Not on file    Allergies  Allergen Reactions  . Atorvastatin Itching    Incessant itching which stopped when he stopped the atorvastatin.  Marland Kitchen Lisinopril Other (See Comments)    Causes pancreatitis     Current Outpatient Medications  Medication Sig Dispense Refill  . ACCU-CHEK FASTCLIX LANCETS MISC CHECK GLUCOSE 2 TIMES DAILY. 102 each 2  . Apixaban (ELIQUIS PO) Take 5 mg by mouth 2 (two) times daily. Patient does not  remember dose     . diphenoxylate-atropine (LOMOTIL) 2.5-0.025 MG tablet TK 1 T PO TID PRF DH  2  . finasteride (PROSCAR) 5 MG tablet     . glimepiride (AMARYL) 1 MG tablet TAKE 1 TABLET BY MOUTH EVERY DAY BY MOUTH WITH BREAKFAST 30 tablet 5  . glucose blood (ACCU-CHEK GUIDE) test strip Check glucose 1-2 times daily 100 each 12  . ipratropium (ATROVENT) 0.03 % nasal spray Place 2 sprays into the nose 4 (four) times daily. Use as needed for nasal drainage 30 mL 6  . metoprolol succinate (TOPROL-XL) 100 MG 24 hr tablet Pt is taking 100 mg and 50 mg twice a day for total of 150 mg BID 1 tablet 0  . Pancrelipase, Lip-Prot-Amyl, (CREON PO) Take by mouth.    . pantoprazole (PROTONIX) 40 MG tablet Take 40 mg by mouth daily.    . polycarbophil (FIBERCON) 625 MG tablet Take 2 tablets (1,250 mg total) by mouth  daily. 30 tablet 0  . prednisoLONE 5 MG TABS tablet Take by mouth.    . tacrolimus (PROGRAF) 1 MG capsule Take 1 mg by mouth 2 (two) times daily.     No current facility-administered medications for this visit.     REVIEW OF SYSTEMS:  [X]  denotes positive finding, [ ]  denotes negative finding Cardiac  Comments:  Chest pain or chest pressure:    Shortness of breath upon exertion: x   Short of breath when lying flat:    Irregular heart rhythm: x       Vascular    Pain in calf, thigh, or hip brought on by ambulation:    Pain in feet at night that wakes you up from your sleep:  x   Blood clot in your veins:    Leg swelling:         Pulmonary    Oxygen at home:    Productive cough:     Wheezing:         Neurologic    Sudden weakness in arms or legs:     Sudden numbness in arms or legs:     Sudden onset of difficulty speaking or slurred speech:    Temporary loss of vision in one eye:     Problems with dizziness:  x       Gastrointestinal    Blood in stool:     Vomited blood:         Genitourinary    Burning when urinating:     Blood in urine:        Psychiatric    Major depression:         Hematologic    Bleeding problems: x   Problems with blood clotting too easily:        Skin    Rashes or ulcers: x       Constitutional    Fever or chills: x     PHYSICAL EXAM: Vitals:   09/07/17 1333  BP: 128/84  Pulse: (!) 119  Resp: 16  Temp: (!) 97.5 F (36.4 C)  TempSrc: Oral  SpO2: 100%  Weight: 148 lb (67.1 kg)  Height: 6\' 3"  (1.905 m)    GENERAL: The patient is a well-nourished male, in no acute distress. The vital signs are documented above. CARDIOVASCULAR: Palpable popliteal pulses bilaterally.  I do not palpate pedal pulses.  Does have dependent rubor bilaterally.  No swelling in either lower extremity. PULMONARY: There is good air exchange  ABDOMEN: Soft and  non-tender  MUSCULOSKELETAL: There are no major deformities or cyanosis. NEUROLOGIC: No focal  weakness or paresthesias are detected. SKIN: There are no ulcers or rashes noted.  FEMA of his medial malleolus on the right PSYCHIATRIC: The patient has a normal affect.  DATA:  Venous duplex showed DVT from 09/03/2017 Serial studies revealed ankle arm index of greater than 1 on the right and 0.37 on the left from 09/03/2017.  Did have some calcification of his vessels but did have biphasic pedal signals  By hand-held Doppler today in our office he does have good flow in his dorsalis pedis bilaterally which sounds biphasic  MRI showed some inflammation but no specific diagnosis regarding his right foot  MEDICAL ISSUES: Unclear as his etiology of his discomfort.  Discussed this with the patient.  Do not see any evidence of critical arterial insufficiency.  Reassured the patient and his wife present.  He does have an appointment to see Dr.Duda to for further evaluation of his foot and ankle.  Will see [place] again on an as-needed basis.  He is asking for pain medication.  I have asked him to direct this to his primary care physician since I do not see any evidence of arterial insufficiency to explain this   Rosetta Posner, MD [facility] [place] [facility]ists of [facility] Tel [phone] Pager [phone]

## 2017-09-08 ENCOUNTER — Telehealth: Payer: Self-pay

## 2017-09-08 DIAGNOSIS — G8929 Other chronic pain: Secondary | ICD-10-CM

## 2017-09-08 DIAGNOSIS — M25571 Pain in right ankle and joints of right foot: Principal | ICD-10-CM

## 2017-09-08 MED ORDER — HYDROCODONE-ACETAMINOPHEN 5-325 MG PO TABS: ORAL_TABLET | ORAL | 0 refills | Status: DC

## 2017-09-08 NOTE — Telephone Encounter (Signed)
Copied from [place] [phone]. Topic: Referral - Request >> Sep 07, 2017  9:10 AM Robina Ade, Helene Kelp D wrote: Reason for CRM: Patient called and said that he would like to talk to Dr. Lorelei Pont about his referral to vascular. They can not see him until 10/05/17. He wants to be seen before that.  >> Sep 07, 2017  9:53 AM Vernona Rieger wrote: Ebony Hail from [facility] called and wanted to let Dr Lorelei Pont know that the first available appointment will not be until 7/3 now because he canceled the 6/26 appointment. She just wanted to let Dr Lorelei Pont know if she needs him to be sooner that he will need to go to another office. Call back if needed [phone] ask for [facility].

## 2017-09-08 NOTE — Addendum Note (Signed)
Addended by: Lamar Blinks C on: 09/08/2017 05:25 PM   Modules accepted: Orders

## 2017-09-08 NOTE — Telephone Encounter (Signed)
Called him back- good news that his foot circulation is ok.  No need for vascular intervention. He has tried tylenol which helps some but not enough Discussed trying some hydrocodone which he has used in the past - tolerated ok rx for vicodin given, he will use sparingly and is cautioned regarding sedation  He will call ortho tomorrow and try to move up his follow-up visit to next week if he can   Meds ordered this encounter  Medications  . HYDROcodone-acetaminophen (NORCO/VICODIN) 5-325 MG tablet    Sig: Take 1/2 or 1 tablet every 8 hours as needed for pain    Dispense:  15 tablet    Refill:  0

## 2017-09-08 NOTE — Telephone Encounter (Signed)
Copied from [place] [id number]. Topic: Inquiry >> Sep 08, 2017 10:16 AM Conception Chancy, NT wrote: Reason for CRM: patient is calling and states he seen the vascular surgeon 09/07/17 and he states that he did not see the need for him to have surgery and have his foot removed in a hurry. He states that he does not have a appt for 2 weeks before he sees someone about is ankle. He would like to know can Dr. Lorelei Pont give him something for relief from the pain. He states he does not want something heavy duty that will make him addicted or crazy dreams. Please advise.  [id number] [place], [place] - [address] AT [place] [address] Phone: [phone] Fax: [phone]

## 2017-09-14 ENCOUNTER — Telehealth: Payer: Self-pay

## 2017-09-14 DIAGNOSIS — G8929 Other chronic pain: Secondary | ICD-10-CM

## 2017-09-14 DIAGNOSIS — M25571 Pain in right ankle and joints of right foot: Principal | ICD-10-CM

## 2017-09-14 MED ORDER — HYDROCODONE-ACETAMINOPHEN 5-325 MG PO TABS: ORAL_TABLET | ORAL | 0 refills | Status: DC

## 2017-09-14 NOTE — Addendum Note (Signed)
Addended by: Lamar Blinks C on: 09/14/2017 12:56 PM   Modules accepted: Orders

## 2017-09-14 NOTE — Telephone Encounter (Signed)
Copied from [place] [id number]. Topic: Inquiry >> Sep 08, 2017 10:16 AM Conception Chancy, NT wrote: Reason for CRM: patient is calling and states he seen the vascular surgeon 09/07/17 and he states that he did not see the need for him to have surgery and have his foot removed in a hurry. He states that he does not have a appt for 2 weeks before he sees someone about is ankle. He would like to know can Dr. Lorelei Pont give him something for relief from the pain. He states he does not want something heavy duty that will make him addicted or crazy dreams. Please advise.  [id number] [place], [place] - [address] AT [address] [place] [address] Phone: [phone] Fax: [phone]    >> Sep 14, 2017 11:19 AM Percell Belt A wrote: Pt called in and stated that he does have an appt on 6/3 .  He stated he will not have enough pain meds to make it to the appt.  He would like to know what Dr copland would suggest   Best number -[facility] in [place]

## 2017-09-14 NOTE — Telephone Encounter (Signed)
Called pt- he is seeing Meridee Score on Monday.  He does not like taking the pain meds but cannot sleep without them- will refill rx for him

## 2017-09-20 ENCOUNTER — Encounter (INDEPENDENT_AMBULATORY_CARE_PROVIDER_SITE_OTHER): Payer: Self-pay | Admitting: Orthopedic Surgery

## 2017-09-20 ENCOUNTER — Ambulatory Visit (INDEPENDENT_AMBULATORY_CARE_PROVIDER_SITE_OTHER): Payer: Medicare Other | Admitting: Orthopedic Surgery

## 2017-09-20 DIAGNOSIS — M1A09X0 Idiopathic chronic gout, multiple sites, without tophus (tophi): Secondary | ICD-10-CM | POA: Diagnosis not present

## 2017-09-20 DIAGNOSIS — I87321 Chronic venous hypertension (idiopathic) with inflammation of right lower extremity: Secondary | ICD-10-CM

## 2017-09-20 NOTE — Progress Notes (Signed)
Office Visit Note   Patient: Matthew Rowland           Date of Birth: [birth year]           MRN: [id number] Visit Date: 09/20/2017              Requested by: Darreld Mclean, MD [address] [place], [place] [address] PCP: Darreld Mclean, MD  Chief Complaint  Patient presents with  . Right Ankle - Follow-up, Pain      HPI: Patient is a 72 year old gentleman who presents for evaluation of the right foot.  Patient complains of pain around the ankle and foot with redness.  Patient states he has had a history of gout but has not taken any medicine for gout.  Patient is currently on prednisone 5 mg a day.  Patient does have diabetic neuropathy.  Assessment & Plan: Visit Diagnoses:  1. Idiopathic chronic gout of multiple sites without tophus   2. 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  We will draw a uric acid level today and call him with results we will place him on medication after we get the lab results back.  Follow-Up Instructions: Return in about 1 week (around 09/27/2017).   Ortho Exam  Patient is alert, oriented, no adenopathy, well-dressed, normal affect, normal respiratory effort. Examination patient has a weakly palpable dorsalis pedis and posterior tibial pulse on the right.  Patient states he has had a vascular work-up and was told his circulation was adequate.  Patient does have dermatitis over the medial malleolus and this is tender to palpation there is no open ulcers he has pitting venous stasis edema with brawny skin color changes in the foot and leg with pitting edema in the foot and leg but no open ulcers.  Patient has some Waggoner grade 1 ulcers on the lateral aspect of the right foot.  There is no redness no cellulitis the wound is approximately 5 mm in diameter 1 mm deep no exposed bone or tendon  no signs of infection.  Patient's last uric acid was 6.6.  His most recent hemoglobin A1c was 10.2.  Imaging: No results found. No images are attached to the encounter.  Labs: Lab Results  Component Value Date   HGBA1C 10.2 (H) 12/24/2016   ESRSEDRATE 11 09/03/2017   CRP 1.2 (H) 09/03/2017   LABURIC 6.6 08/12/2017     Lab Results  Component Value Date   ALBUMIN 3.7 08/18/2017   ALBUMIN 3.7 08/12/2017   LABURIC 6.6 08/12/2017    There is no height or weight on file to calculate BMI.  Orders:  No orders of the defined types were placed in this encounter.  No orders of the defined types were placed in this encounter.    Procedures: No procedures performed  Clinical Data: No additional findings.  ROS:  All other systems negative, except as noted in the HPI. Review of Systems  Objective: Vital Signs: There were no vitals taken for this visit.  Specialty Comments:  No specialty comments available.  PMFS History: Patient Active Problem List   Diagnosis Date Noted  . Squamous cell carcinoma of skin of face 06/02/2017  . Elbow injury, right, subsequent encounter 05/20/2016  . Fracture of right scapular body 05/20/2016  . Chronic combined systolic and diastolic CHF (congestive heart failure) ([place]) 03/29/2016  .  Cardiomyopathy ([place]) 03/23/2016  . 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 Essential hypertension 01/15/2016  . H/O acute pancreatitis 01/15/2016  . Immunosuppression ([place]) 01/15/2016  . Type 2 diabetes mellitus with circulatory disorder ([place]) 01/15/2016  . Hx of kidney transplant 01/04/2016  . Folate deficiency 12/08/2015  . Pancytopenia ([place]) 12/08/2015  . CKD (chronic kidney disease) 01/03/2015  . Atrial fibrillation, persistent ([place]) 11/19/2014  . SSS (sick sinus syndrome) ([place]) 02/09/2014  . CAD (coronary artery disease) 02/21/2013   Past Medical History:  Diagnosis Date  . A-fib ([place])   . CHF (congestive heart  failure) ([place])   . Chronic diarrhea   . Diabetes mellitus without complication (HCC)    borderline  . History of ITP   . Hypertension   . Pancreatitis   . Renal disorder     History reviewed. No pertinent family history.  Past Surgical History:  Procedure Laterality Date  . ABDOMINAL SURGERY    . CHOLECYSTECTOMY    . KIDNEY TRANSPLANT     Social History   Occupational History  . Not on file  Tobacco Use  . Smoking status: Never Smoker  . Smokeless tobacco: Never Used  Substance and Sexual Activity  . Alcohol use: No  . Drug use: No  . Sexual activity: Not on file

## 2017-09-21 LAB — URIC ACID: Uric Acid, Serum: 5.9 mg/dL (ref 4.0–8.0)

## 2017-09-22 ENCOUNTER — Telehealth (INDEPENDENT_AMBULATORY_CARE_PROVIDER_SITE_OTHER): Payer: Self-pay | Admitting: Orthopedic Surgery

## 2017-09-22 ENCOUNTER — Telehealth (INDEPENDENT_AMBULATORY_CARE_PROVIDER_SITE_OTHER): Payer: Self-pay

## 2017-09-22 DIAGNOSIS — D899 Disorder involving the immune mechanism, unspecified: Secondary | ICD-10-CM | POA: Diagnosis not present

## 2017-09-22 DIAGNOSIS — Z940 Kidney transplant status: Secondary | ICD-10-CM | POA: Diagnosis not present

## 2017-09-22 NOTE — Telephone Encounter (Signed)
Patient was given prescription to get compression stockings, the medical supply place does not have them in and said it could be next week. He is asking if there is another location where he can get the same ones. Please advise # [phone]

## 2017-09-22 NOTE — Telephone Encounter (Signed)
Called pt to advise that he can use [facility] or [facility]

## 2017-09-22 NOTE — Telephone Encounter (Signed)
I called pt to advise of lab result and he asked what he is supposed to do if his uric acid is normal? I advised per the dictation that he should be wearing his medical compression hose round the clock and he states that he has not bee able to purchase these as the store was out of his size. He states that they were going to get a shipment today and he was going to purchase them. He asked if Dr. Sharol Given was going to refill his allopurinol. He states that he took this medication 30 years ago. I advised that no at this point being that the labs were normal that he should continue with the treatment with the compression socks and he has an appt on Monday to see Dr. Sharol Given at that time they can discuss further treatment options.

## 2017-09-22 NOTE — Telephone Encounter (Signed)
-----   Message from Newt Minion, MD sent at 09/21/2017 10:42 AM EDT ----- Uric acid 5.9 no gout

## 2017-09-23 DIAGNOSIS — R5381 Other malaise: Secondary | ICD-10-CM | POA: Diagnosis not present

## 2017-09-23 DIAGNOSIS — E875 Hyperkalemia: Secondary | ICD-10-CM | POA: Diagnosis not present

## 2017-09-23 DIAGNOSIS — K861 Other chronic pancreatitis: Secondary | ICD-10-CM | POA: Diagnosis not present

## 2017-09-23 DIAGNOSIS — E43 Unspecified severe protein-calorie malnutrition: Secondary | ICD-10-CM | POA: Diagnosis not present

## 2017-09-23 DIAGNOSIS — I739 Peripheral vascular disease, unspecified: Secondary | ICD-10-CM | POA: Diagnosis not present

## 2017-09-23 DIAGNOSIS — K863 Pseudocyst of pancreas: Secondary | ICD-10-CM | POA: Diagnosis not present

## 2017-09-23 DIAGNOSIS — L03031 Cellulitis of right toe: Secondary | ICD-10-CM | POA: Diagnosis not present

## 2017-09-23 DIAGNOSIS — R740 Nonspecific elevation of levels of transaminase and lactic acid dehydrogenase [LDH]: Secondary | ICD-10-CM | POA: Diagnosis not present

## 2017-09-23 DIAGNOSIS — E1169 Type 2 diabetes mellitus with other specified complication: Secondary | ICD-10-CM | POA: Diagnosis not present

## 2017-09-23 DIAGNOSIS — R0602 Shortness of breath: Secondary | ICD-10-CM | POA: Diagnosis not present

## 2017-09-23 DIAGNOSIS — E11621 Type 2 diabetes mellitus with foot ulcer: Secondary | ICD-10-CM | POA: Diagnosis not present

## 2017-09-23 DIAGNOSIS — R197 Diarrhea, unspecified: Secondary | ICD-10-CM | POA: Diagnosis not present

## 2017-09-23 DIAGNOSIS — E1122 Type 2 diabetes mellitus with diabetic chronic kidney disease: Secondary | ICD-10-CM | POA: Diagnosis not present

## 2017-09-23 DIAGNOSIS — E860 Dehydration: Secondary | ICD-10-CM | POA: Diagnosis not present

## 2017-09-23 DIAGNOSIS — D61818 Other pancytopenia: Secondary | ICD-10-CM | POA: Diagnosis not present

## 2017-09-23 DIAGNOSIS — Z940 Kidney transplant status: Secondary | ICD-10-CM | POA: Diagnosis not present

## 2017-09-23 DIAGNOSIS — N179 Acute kidney failure, unspecified: Secondary | ICD-10-CM | POA: Diagnosis not present

## 2017-09-23 DIAGNOSIS — R748 Abnormal levels of other serum enzymes: Secondary | ICD-10-CM | POA: Diagnosis not present

## 2017-09-23 DIAGNOSIS — I129 Hypertensive chronic kidney disease with stage 1 through stage 4 chronic kidney disease, or unspecified chronic kidney disease: Secondary | ICD-10-CM | POA: Diagnosis not present

## 2017-09-23 DIAGNOSIS — L97511 Non-pressure chronic ulcer of other part of right foot limited to breakdown of skin: Secondary | ICD-10-CM | POA: Diagnosis not present

## 2017-09-23 DIAGNOSIS — N189 Chronic kidney disease, unspecified: Secondary | ICD-10-CM | POA: Diagnosis not present

## 2017-09-24 DIAGNOSIS — Z794 Long term (current) use of insulin: Secondary | ICD-10-CM | POA: Diagnosis not present

## 2017-09-24 DIAGNOSIS — E875 Hyperkalemia: Secondary | ICD-10-CM | POA: Diagnosis not present

## 2017-09-24 DIAGNOSIS — R7989 Other specified abnormal findings of blood chemistry: Secondary | ICD-10-CM | POA: Diagnosis not present

## 2017-09-24 DIAGNOSIS — I482 Chronic atrial fibrillation: Secondary | ICD-10-CM | POA: Diagnosis not present

## 2017-09-24 DIAGNOSIS — I2510 Atherosclerotic heart disease of native coronary artery without angina pectoris: Secondary | ICD-10-CM | POA: Diagnosis not present

## 2017-09-24 DIAGNOSIS — Z940 Kidney transplant status: Secondary | ICD-10-CM | POA: Diagnosis not present

## 2017-09-24 DIAGNOSIS — I959 Hypotension, unspecified: Secondary | ICD-10-CM | POA: Diagnosis not present

## 2017-09-24 DIAGNOSIS — E11649 Type 2 diabetes mellitus with hypoglycemia without coma: Secondary | ICD-10-CM | POA: Diagnosis not present

## 2017-09-24 DIAGNOSIS — H532 Diplopia: Secondary | ICD-10-CM | POA: Diagnosis not present

## 2017-09-24 DIAGNOSIS — K861 Other chronic pancreatitis: Secondary | ICD-10-CM | POA: Diagnosis not present

## 2017-09-24 DIAGNOSIS — R5383 Other fatigue: Secondary | ICD-10-CM | POA: Diagnosis not present

## 2017-09-24 DIAGNOSIS — R197 Diarrhea, unspecified: Secondary | ICD-10-CM | POA: Diagnosis not present

## 2017-09-25 DIAGNOSIS — I120 Hypertensive chronic kidney disease with stage 5 chronic kidney disease or end stage renal disease: Secondary | ICD-10-CM | POA: Diagnosis not present

## 2017-09-25 DIAGNOSIS — L03031 Cellulitis of right toe: Secondary | ICD-10-CM | POA: Diagnosis present

## 2017-09-25 DIAGNOSIS — M87877 Other osteonecrosis, right toe(s): Secondary | ICD-10-CM | POA: Diagnosis not present

## 2017-09-25 DIAGNOSIS — I481 Persistent atrial fibrillation: Secondary | ICD-10-CM | POA: Diagnosis not present

## 2017-09-25 DIAGNOSIS — N179 Acute kidney failure, unspecified: Secondary | ICD-10-CM | POA: Diagnosis present

## 2017-09-25 DIAGNOSIS — D693 Immune thrombocytopenic purpura: Secondary | ICD-10-CM | POA: Diagnosis present

## 2017-09-25 DIAGNOSIS — T8131XA Disruption of external operation (surgical) wound, not elsewhere classified, initial encounter: Secondary | ICD-10-CM | POA: Diagnosis not present

## 2017-09-25 DIAGNOSIS — I2510 Atherosclerotic heart disease of native coronary artery without angina pectoris: Secondary | ICD-10-CM | POA: Diagnosis present

## 2017-09-25 DIAGNOSIS — R269 Unspecified abnormalities of gait and mobility: Secondary | ICD-10-CM | POA: Diagnosis not present

## 2017-09-25 DIAGNOSIS — D61818 Other pancytopenia: Secondary | ICD-10-CM | POA: Diagnosis present

## 2017-09-25 DIAGNOSIS — E1151 Type 2 diabetes mellitus with diabetic peripheral angiopathy without gangrene: Secondary | ICD-10-CM | POA: Diagnosis not present

## 2017-09-25 DIAGNOSIS — B9620 Unspecified Escherichia coli [E. coli] as the cause of diseases classified elsewhere: Secondary | ICD-10-CM | POA: Diagnosis not present

## 2017-09-25 DIAGNOSIS — K869 Disease of pancreas, unspecified: Secondary | ICD-10-CM | POA: Diagnosis not present

## 2017-09-25 DIAGNOSIS — E1122 Type 2 diabetes mellitus with diabetic chronic kidney disease: Secondary | ICD-10-CM | POA: Diagnosis present

## 2017-09-25 DIAGNOSIS — R262 Difficulty in walking, not elsewhere classified: Secondary | ICD-10-CM | POA: Diagnosis not present

## 2017-09-25 DIAGNOSIS — Z940 Kidney transplant status: Secondary | ICD-10-CM | POA: Diagnosis not present

## 2017-09-25 DIAGNOSIS — L97519 Non-pressure chronic ulcer of other part of right foot with unspecified severity: Secondary | ICD-10-CM | POA: Diagnosis not present

## 2017-09-25 DIAGNOSIS — Z7952 Long term (current) use of systemic steroids: Secondary | ICD-10-CM | POA: Diagnosis not present

## 2017-09-25 DIAGNOSIS — M25571 Pain in right ankle and joints of right foot: Secondary | ICD-10-CM | POA: Diagnosis not present

## 2017-09-25 DIAGNOSIS — I130 Hypertensive heart and chronic kidney disease with heart failure and stage 1 through stage 4 chronic kidney disease, or unspecified chronic kidney disease: Secondary | ICD-10-CM | POA: Diagnosis present

## 2017-09-25 DIAGNOSIS — I4892 Unspecified atrial flutter: Secondary | ICD-10-CM | POA: Diagnosis not present

## 2017-09-25 DIAGNOSIS — Z9181 History of falling: Secondary | ICD-10-CM | POA: Diagnosis not present

## 2017-09-25 DIAGNOSIS — R945 Abnormal results of liver function studies: Secondary | ICD-10-CM | POA: Diagnosis not present

## 2017-09-25 DIAGNOSIS — L97514 Non-pressure chronic ulcer of other part of right foot with necrosis of bone: Secondary | ICD-10-CM | POA: Diagnosis present

## 2017-09-25 DIAGNOSIS — L97529 Non-pressure chronic ulcer of other part of left foot with unspecified severity: Secondary | ICD-10-CM | POA: Diagnosis not present

## 2017-09-25 DIAGNOSIS — K861 Other chronic pancreatitis: Secondary | ICD-10-CM | POA: Diagnosis present

## 2017-09-25 DIAGNOSIS — M71071 Abscess of bursa, right ankle and foot: Secondary | ICD-10-CM | POA: Diagnosis present

## 2017-09-25 DIAGNOSIS — E43 Unspecified severe protein-calorie malnutrition: Secondary | ICD-10-CM | POA: Diagnosis present

## 2017-09-25 DIAGNOSIS — E11621 Type 2 diabetes mellitus with foot ulcer: Secondary | ICD-10-CM | POA: Diagnosis present

## 2017-09-25 DIAGNOSIS — Z79899 Other long term (current) drug therapy: Secondary | ICD-10-CM | POA: Diagnosis not present

## 2017-09-25 DIAGNOSIS — R740 Nonspecific elevation of levels of transaminase and lactic acid dehydrogenase [LDH]: Secondary | ICD-10-CM | POA: Diagnosis not present

## 2017-09-25 DIAGNOSIS — T8619 Other complication of kidney transplant: Secondary | ICD-10-CM | POA: Diagnosis present

## 2017-09-25 DIAGNOSIS — E1169 Type 2 diabetes mellitus with other specified complication: Secondary | ICD-10-CM | POA: Diagnosis present

## 2017-09-25 DIAGNOSIS — L97511 Non-pressure chronic ulcer of other part of right foot limited to breakdown of skin: Secondary | ICD-10-CM | POA: Diagnosis not present

## 2017-09-25 DIAGNOSIS — M86171 Other acute osteomyelitis, right ankle and foot: Secondary | ICD-10-CM | POA: Diagnosis present

## 2017-09-25 DIAGNOSIS — R5381 Other malaise: Secondary | ICD-10-CM | POA: Diagnosis not present

## 2017-09-25 DIAGNOSIS — I482 Chronic atrial fibrillation: Secondary | ICD-10-CM | POA: Diagnosis present

## 2017-09-25 DIAGNOSIS — Z955 Presence of coronary angioplasty implant and graft: Secondary | ICD-10-CM | POA: Diagnosis not present

## 2017-09-25 DIAGNOSIS — Z794 Long term (current) use of insulin: Secondary | ICD-10-CM | POA: Diagnosis not present

## 2017-09-25 DIAGNOSIS — R1312 Dysphagia, oropharyngeal phase: Secondary | ICD-10-CM | POA: Diagnosis not present

## 2017-09-25 DIAGNOSIS — Z681 Body mass index (BMI) 19 or less, adult: Secondary | ICD-10-CM | POA: Diagnosis not present

## 2017-09-25 DIAGNOSIS — I248 Other forms of acute ischemic heart disease: Secondary | ICD-10-CM | POA: Diagnosis present

## 2017-09-25 DIAGNOSIS — R7989 Other specified abnormal findings of blood chemistry: Secondary | ICD-10-CM | POA: Diagnosis not present

## 2017-09-25 DIAGNOSIS — I4891 Unspecified atrial fibrillation: Secondary | ICD-10-CM | POA: Diagnosis not present

## 2017-09-25 DIAGNOSIS — N186 End stage renal disease: Secondary | ICD-10-CM | POA: Diagnosis not present

## 2017-09-25 DIAGNOSIS — K863 Pseudocyst of pancreas: Secondary | ICD-10-CM | POA: Diagnosis present

## 2017-09-25 DIAGNOSIS — R9431 Abnormal electrocardiogram [ECG] [EKG]: Secondary | ICD-10-CM | POA: Diagnosis not present

## 2017-09-25 DIAGNOSIS — R197 Diarrhea, unspecified: Secondary | ICD-10-CM | POA: Diagnosis not present

## 2017-09-25 DIAGNOSIS — E860 Dehydration: Secondary | ICD-10-CM | POA: Diagnosis present

## 2017-09-25 DIAGNOSIS — K5900 Constipation, unspecified: Secondary | ICD-10-CM | POA: Diagnosis not present

## 2017-09-25 DIAGNOSIS — R2689 Other abnormalities of gait and mobility: Secondary | ICD-10-CM | POA: Diagnosis present

## 2017-09-25 DIAGNOSIS — M6281 Muscle weakness (generalized): Secondary | ICD-10-CM | POA: Diagnosis not present

## 2017-09-25 DIAGNOSIS — N183 Chronic kidney disease, stage 3 (moderate): Secondary | ICD-10-CM | POA: Diagnosis not present

## 2017-09-25 DIAGNOSIS — I959 Hypotension, unspecified: Secondary | ICD-10-CM | POA: Diagnosis not present

## 2017-09-25 DIAGNOSIS — K529 Noninfective gastroenteritis and colitis, unspecified: Secondary | ICD-10-CM | POA: Diagnosis present

## 2017-09-25 DIAGNOSIS — Z792 Long term (current) use of antibiotics: Secondary | ICD-10-CM | POA: Diagnosis not present

## 2017-09-25 DIAGNOSIS — M25471 Effusion, right ankle: Secondary | ICD-10-CM | POA: Diagnosis not present

## 2017-09-25 DIAGNOSIS — L97518 Non-pressure chronic ulcer of other part of right foot with other specified severity: Secondary | ICD-10-CM | POA: Diagnosis not present

## 2017-09-25 DIAGNOSIS — E441 Mild protein-calorie malnutrition: Secondary | ICD-10-CM | POA: Diagnosis not present

## 2017-09-25 DIAGNOSIS — I129 Hypertensive chronic kidney disease with stage 1 through stage 4 chronic kidney disease, or unspecified chronic kidney disease: Secondary | ICD-10-CM | POA: Diagnosis not present

## 2017-09-25 DIAGNOSIS — M86271 Subacute osteomyelitis, right ankle and foot: Secondary | ICD-10-CM | POA: Diagnosis not present

## 2017-09-25 DIAGNOSIS — Z452 Encounter for adjustment and management of vascular access device: Secondary | ICD-10-CM | POA: Diagnosis not present

## 2017-09-27 ENCOUNTER — Ambulatory Visit (INDEPENDENT_AMBULATORY_CARE_PROVIDER_SITE_OTHER): Payer: Medicare Other | Admitting: Orthopedic Surgery

## 2017-10-06 DIAGNOSIS — M25571 Pain in right ankle and joints of right foot: Secondary | ICD-10-CM | POA: Diagnosis not present

## 2017-10-06 DIAGNOSIS — M86271 Subacute osteomyelitis, right ankle and foot: Secondary | ICD-10-CM | POA: Diagnosis not present

## 2017-10-14 DIAGNOSIS — Z7901 Long term (current) use of anticoagulants: Secondary | ICD-10-CM | POA: Diagnosis not present

## 2017-10-14 DIAGNOSIS — Z79899 Other long term (current) drug therapy: Secondary | ICD-10-CM | POA: Diagnosis not present

## 2017-10-14 DIAGNOSIS — E1151 Type 2 diabetes mellitus with diabetic peripheral angiopathy without gangrene: Secondary | ICD-10-CM | POA: Diagnosis present

## 2017-10-14 DIAGNOSIS — Z940 Kidney transplant status: Secondary | ICD-10-CM | POA: Diagnosis not present

## 2017-10-14 DIAGNOSIS — L97518 Non-pressure chronic ulcer of other part of right foot with other specified severity: Secondary | ICD-10-CM | POA: Diagnosis not present

## 2017-10-14 DIAGNOSIS — E860 Dehydration: Secondary | ICD-10-CM | POA: Diagnosis present

## 2017-10-14 DIAGNOSIS — Z452 Encounter for adjustment and management of vascular access device: Secondary | ICD-10-CM | POA: Diagnosis not present

## 2017-10-14 DIAGNOSIS — R740 Nonspecific elevation of levels of transaminase and lactic acid dehydrogenase [LDH]: Secondary | ICD-10-CM | POA: Diagnosis not present

## 2017-10-14 DIAGNOSIS — I130 Hypertensive heart and chronic kidney disease with heart failure and stage 1 through stage 4 chronic kidney disease, or unspecified chronic kidney disease: Secondary | ICD-10-CM | POA: Diagnosis present

## 2017-10-14 DIAGNOSIS — E43 Unspecified severe protein-calorie malnutrition: Secondary | ICD-10-CM | POA: Diagnosis not present

## 2017-10-14 DIAGNOSIS — E1169 Type 2 diabetes mellitus with other specified complication: Secondary | ICD-10-CM | POA: Diagnosis present

## 2017-10-14 DIAGNOSIS — R262 Difficulty in walking, not elsewhere classified: Secondary | ICD-10-CM | POA: Diagnosis not present

## 2017-10-14 DIAGNOSIS — L97529 Non-pressure chronic ulcer of other part of left foot with unspecified severity: Secondary | ICD-10-CM | POA: Diagnosis not present

## 2017-10-14 DIAGNOSIS — R5381 Other malaise: Secondary | ICD-10-CM | POA: Diagnosis not present

## 2017-10-14 DIAGNOSIS — R112 Nausea with vomiting, unspecified: Secondary | ICD-10-CM | POA: Diagnosis not present

## 2017-10-14 DIAGNOSIS — A0811 Acute gastroenteropathy due to Norwalk agent: Secondary | ICD-10-CM | POA: Diagnosis present

## 2017-10-14 DIAGNOSIS — Z89421 Acquired absence of other right toe(s): Secondary | ICD-10-CM | POA: Diagnosis not present

## 2017-10-14 DIAGNOSIS — N189 Chronic kidney disease, unspecified: Secondary | ICD-10-CM | POA: Diagnosis not present

## 2017-10-14 DIAGNOSIS — Z792 Long term (current) use of antibiotics: Secondary | ICD-10-CM | POA: Diagnosis not present

## 2017-10-14 DIAGNOSIS — M868X7 Other osteomyelitis, ankle and foot: Secondary | ICD-10-CM | POA: Diagnosis present

## 2017-10-14 DIAGNOSIS — Z7952 Long term (current) use of systemic steroids: Secondary | ICD-10-CM | POA: Diagnosis not present

## 2017-10-14 DIAGNOSIS — M6281 Muscle weakness (generalized): Secondary | ICD-10-CM | POA: Diagnosis not present

## 2017-10-14 DIAGNOSIS — K529 Noninfective gastroenteritis and colitis, unspecified: Secondary | ICD-10-CM | POA: Diagnosis not present

## 2017-10-14 DIAGNOSIS — Z4822 Encounter for aftercare following kidney transplant: Secondary | ICD-10-CM | POA: Diagnosis not present

## 2017-10-14 DIAGNOSIS — M86271 Subacute osteomyelitis, right ankle and foot: Secondary | ICD-10-CM | POA: Diagnosis not present

## 2017-10-14 DIAGNOSIS — R634 Abnormal weight loss: Secondary | ICD-10-CM | POA: Diagnosis not present

## 2017-10-14 DIAGNOSIS — R1312 Dysphagia, oropharyngeal phase: Secondary | ICD-10-CM | POA: Diagnosis not present

## 2017-10-14 DIAGNOSIS — R269 Unspecified abnormalities of gait and mobility: Secondary | ICD-10-CM | POA: Diagnosis present

## 2017-10-14 DIAGNOSIS — Z9181 History of falling: Secondary | ICD-10-CM | POA: Diagnosis not present

## 2017-10-14 DIAGNOSIS — E878 Other disorders of electrolyte and fluid balance, not elsewhere classified: Secondary | ICD-10-CM | POA: Diagnosis not present

## 2017-10-14 DIAGNOSIS — E46 Unspecified protein-calorie malnutrition: Secondary | ICD-10-CM | POA: Diagnosis not present

## 2017-10-14 DIAGNOSIS — E119 Type 2 diabetes mellitus without complications: Secondary | ICD-10-CM | POA: Diagnosis not present

## 2017-10-14 DIAGNOSIS — K861 Other chronic pancreatitis: Secondary | ICD-10-CM | POA: Diagnosis not present

## 2017-10-14 DIAGNOSIS — I2510 Atherosclerotic heart disease of native coronary artery without angina pectoris: Secondary | ICD-10-CM | POA: Diagnosis present

## 2017-10-14 DIAGNOSIS — N183 Chronic kidney disease, stage 3 (moderate): Secondary | ICD-10-CM | POA: Diagnosis present

## 2017-10-14 DIAGNOSIS — D649 Anemia, unspecified: Secondary | ICD-10-CM | POA: Diagnosis not present

## 2017-10-14 DIAGNOSIS — E11621 Type 2 diabetes mellitus with foot ulcer: Secondary | ICD-10-CM | POA: Diagnosis not present

## 2017-10-14 DIAGNOSIS — E1122 Type 2 diabetes mellitus with diabetic chronic kidney disease: Secondary | ICD-10-CM | POA: Diagnosis present

## 2017-10-14 DIAGNOSIS — E785 Hyperlipidemia, unspecified: Secondary | ICD-10-CM | POA: Diagnosis present

## 2017-10-14 DIAGNOSIS — E1159 Type 2 diabetes mellitus with other circulatory complications: Secondary | ICD-10-CM | POA: Diagnosis present

## 2017-10-14 DIAGNOSIS — E441 Mild protein-calorie malnutrition: Secondary | ICD-10-CM | POA: Diagnosis not present

## 2017-10-14 DIAGNOSIS — N179 Acute kidney failure, unspecified: Secondary | ICD-10-CM | POA: Diagnosis not present

## 2017-10-14 DIAGNOSIS — Z681 Body mass index (BMI) 19 or less, adult: Secondary | ICD-10-CM | POA: Diagnosis not present

## 2017-10-14 DIAGNOSIS — R7989 Other specified abnormal findings of blood chemistry: Secondary | ICD-10-CM | POA: Diagnosis not present

## 2017-10-14 DIAGNOSIS — I509 Heart failure, unspecified: Secondary | ICD-10-CM | POA: Diagnosis present

## 2017-10-14 DIAGNOSIS — D899 Disorder involving the immune mechanism, unspecified: Secondary | ICD-10-CM | POA: Diagnosis not present

## 2017-10-14 DIAGNOSIS — B9620 Unspecified Escherichia coli [E. coli] as the cause of diseases classified elsewhere: Secondary | ICD-10-CM | POA: Diagnosis not present

## 2017-10-14 DIAGNOSIS — R2681 Unsteadiness on feet: Secondary | ICD-10-CM | POA: Diagnosis not present

## 2017-10-14 DIAGNOSIS — D509 Iron deficiency anemia, unspecified: Secondary | ICD-10-CM | POA: Diagnosis present

## 2017-10-14 DIAGNOSIS — T887XXA Unspecified adverse effect of drug or medicament, initial encounter: Secondary | ICD-10-CM | POA: Diagnosis not present

## 2017-10-14 DIAGNOSIS — R197 Diarrhea, unspecified: Secondary | ICD-10-CM | POA: Diagnosis not present

## 2017-10-14 DIAGNOSIS — I110 Hypertensive heart disease with heart failure: Secondary | ICD-10-CM | POA: Diagnosis present

## 2017-10-14 DIAGNOSIS — I429 Cardiomyopathy, unspecified: Secondary | ICD-10-CM | POA: Diagnosis present

## 2017-10-14 DIAGNOSIS — M86171 Other acute osteomyelitis, right ankle and foot: Secondary | ICD-10-CM | POA: Diagnosis not present

## 2017-10-14 DIAGNOSIS — I129 Hypertensive chronic kidney disease with stage 1 through stage 4 chronic kidney disease, or unspecified chronic kidney disease: Secondary | ICD-10-CM | POA: Diagnosis not present

## 2017-10-14 DIAGNOSIS — R636 Underweight: Secondary | ICD-10-CM | POA: Diagnosis present

## 2017-10-14 DIAGNOSIS — G4730 Sleep apnea, unspecified: Secondary | ICD-10-CM | POA: Diagnosis present

## 2017-10-14 DIAGNOSIS — I482 Chronic atrial fibrillation: Secondary | ICD-10-CM | POA: Diagnosis present

## 2017-10-14 DIAGNOSIS — I4891 Unspecified atrial fibrillation: Secondary | ICD-10-CM | POA: Diagnosis not present

## 2017-10-14 DIAGNOSIS — R945 Abnormal results of liver function studies: Secondary | ICD-10-CM | POA: Diagnosis present

## 2017-10-14 DIAGNOSIS — D61818 Other pancytopenia: Secondary | ICD-10-CM | POA: Diagnosis present

## 2017-10-14 MED ORDER — GABAPENTIN 600 MG PO TABS
300.00 | ORAL_TABLET | ORAL | Status: DC
Start: 2017-10-14 — End: 2017-10-14

## 2017-10-14 MED ORDER — ZINC SULFATE 220 (50 ZN) MG PO CAPS
220.00 | ORAL_CAPSULE | ORAL | Status: DC
Start: 2017-10-15 — End: 2017-10-14

## 2017-10-14 MED ORDER — TACROLIMUS 1 MG PO CAPS
2.00 | ORAL_CAPSULE | ORAL | Status: DC
Start: 2017-10-14 — End: 2017-10-14

## 2017-10-14 MED ORDER — PREDNISONE 5 MG PO TABS
5.00 | ORAL_TABLET | ORAL | Status: DC
Start: 2017-10-15 — End: 2017-10-14

## 2017-10-14 MED ORDER — POLYSACCHARIDE IRON COMPLEX 150 MG PO CAPS
150.00 | ORAL_CAPSULE | ORAL | Status: DC
Start: 2017-10-15 — End: 2017-10-14

## 2017-10-14 MED ORDER — INSULIN LISPRO 100 UNIT/ML ~~LOC~~ SOLN
1.00 | SUBCUTANEOUS | Status: DC
Start: 2017-10-14 — End: 2017-10-14

## 2017-10-14 MED ORDER — CHOLESTYRAMINE 4 G PO PACK
4.00 g | PACK | ORAL | Status: DC
Start: 2017-10-14 — End: 2017-10-14

## 2017-10-14 MED ORDER — SODIUM CHLORIDE 0.9 % IJ SOLN
20.00 | INTRAMUSCULAR | Status: DC
Start: 2017-10-15 — End: 2017-10-14

## 2017-10-14 MED ORDER — PANTOPRAZOLE SODIUM 40 MG PO TBEC
40.00 | DELAYED_RELEASE_TABLET | ORAL | Status: DC
Start: 2017-10-15 — End: 2017-10-14

## 2017-10-14 MED ORDER — APIXABAN 5 MG PO TABS
5.00 | ORAL_TABLET | ORAL | Status: DC
Start: 2017-10-14 — End: 2017-10-14

## 2017-10-14 MED ORDER — PANCRELIPASE (LIP-PROT-AMYL) 24000-76000 UNITS PO CPEP
24000.00 | ORAL_CAPSULE | ORAL | Status: DC
Start: 2017-10-14 — End: 2017-10-14

## 2017-10-14 MED ORDER — OXYCODONE HCL 5 MG PO TABS
5.00 | ORAL_TABLET | ORAL | Status: DC
Start: ? — End: 2017-10-14

## 2017-10-14 MED ORDER — IRON PO
1.00 | ORAL | Status: DC
Start: 2017-10-15 — End: 2017-10-14

## 2017-10-14 MED ORDER — METOPROLOL SUCCINATE ER 50 MG PO TB24
150.00 | ORAL_TABLET | ORAL | Status: DC
Start: 2017-10-14 — End: 2017-10-14

## 2017-10-14 MED ORDER — DIPHENOXYLATE-ATROPINE 2.5-0.025 MG PO TABS
1.00 | ORAL_TABLET | ORAL | Status: DC
Start: 2017-10-14 — End: 2017-10-14

## 2017-10-14 MED ORDER — GENERIC EXTERNAL MEDICATION
Status: DC
Start: 2017-10-14 — End: 2017-10-14

## 2017-10-14 MED ORDER — MELATONIN 3 MG PO TABS
6.00 | ORAL_TABLET | ORAL | Status: DC
Start: 2017-10-14 — End: 2017-10-14

## 2017-10-14 MED ORDER — GUAIFENESIN 100 MG/5ML PO SYRP
200.00 | ORAL_SOLUTION | ORAL | Status: DC
Start: ? — End: 2017-10-14

## 2017-10-14 MED ORDER — SODIUM CHLORIDE 0.9 % IJ SOLN
20.00 | INTRAMUSCULAR | Status: DC
Start: ? — End: 2017-10-14

## 2017-10-14 MED ORDER — FINASTERIDE 5 MG PO TABS
5.00 | ORAL_TABLET | ORAL | Status: DC
Start: 2017-10-15 — End: 2017-10-14

## 2017-10-14 MED ORDER — FOLIC ACID 1 MG PO TABS
0.50 | ORAL_TABLET | ORAL | Status: DC
Start: 2017-10-15 — End: 2017-10-14

## 2017-10-14 MED ORDER — GENERIC EXTERNAL MEDICATION
2.00 g | Status: DC
Start: 2017-10-15 — End: 2017-10-14

## 2017-10-15 DIAGNOSIS — R5381 Other malaise: Secondary | ICD-10-CM | POA: Diagnosis not present

## 2017-10-15 DIAGNOSIS — I4891 Unspecified atrial fibrillation: Secondary | ICD-10-CM | POA: Diagnosis not present

## 2017-10-15 DIAGNOSIS — T887XXA Unspecified adverse effect of drug or medicament, initial encounter: Secondary | ICD-10-CM | POA: Diagnosis not present

## 2017-10-15 DIAGNOSIS — N189 Chronic kidney disease, unspecified: Secondary | ICD-10-CM | POA: Diagnosis not present

## 2017-10-15 DIAGNOSIS — B9620 Unspecified Escherichia coli [E. coli] as the cause of diseases classified elsewhere: Secondary | ICD-10-CM | POA: Diagnosis not present

## 2017-10-15 DIAGNOSIS — E1122 Type 2 diabetes mellitus with diabetic chronic kidney disease: Secondary | ICD-10-CM | POA: Diagnosis not present

## 2017-10-15 DIAGNOSIS — R197 Diarrhea, unspecified: Secondary | ICD-10-CM | POA: Diagnosis not present

## 2017-10-15 DIAGNOSIS — Z940 Kidney transplant status: Secondary | ICD-10-CM | POA: Diagnosis not present

## 2017-10-15 DIAGNOSIS — M868X7 Other osteomyelitis, ankle and foot: Secondary | ICD-10-CM | POA: Diagnosis not present

## 2017-10-18 DIAGNOSIS — I482 Chronic atrial fibrillation: Secondary | ICD-10-CM | POA: Diagnosis not present

## 2017-10-18 DIAGNOSIS — R197 Diarrhea, unspecified: Secondary | ICD-10-CM | POA: Diagnosis not present

## 2017-10-18 DIAGNOSIS — Z7901 Long term (current) use of anticoagulants: Secondary | ICD-10-CM | POA: Diagnosis not present

## 2017-10-19 DIAGNOSIS — M86271 Subacute osteomyelitis, right ankle and foot: Secondary | ICD-10-CM | POA: Diagnosis not present

## 2017-10-20 ENCOUNTER — Encounter (HOSPITAL_COMMUNITY): Payer: Medicare Other

## 2017-10-20 ENCOUNTER — Encounter: Payer: Medicare Other | Admitting: Vascular Surgery

## 2017-10-20 DIAGNOSIS — Z4822 Encounter for aftercare following kidney transplant: Secondary | ICD-10-CM | POA: Diagnosis not present

## 2017-10-20 DIAGNOSIS — N179 Acute kidney failure, unspecified: Secondary | ICD-10-CM | POA: Diagnosis not present

## 2017-10-20 DIAGNOSIS — Z89421 Acquired absence of other right toe(s): Secondary | ICD-10-CM | POA: Diagnosis not present

## 2017-10-20 DIAGNOSIS — Z940 Kidney transplant status: Secondary | ICD-10-CM | POA: Diagnosis not present

## 2017-10-20 DIAGNOSIS — E119 Type 2 diabetes mellitus without complications: Secondary | ICD-10-CM | POA: Diagnosis not present

## 2017-10-20 DIAGNOSIS — K529 Noninfective gastroenteritis and colitis, unspecified: Secondary | ICD-10-CM | POA: Diagnosis not present

## 2017-10-20 DIAGNOSIS — D899 Disorder involving the immune mechanism, unspecified: Secondary | ICD-10-CM | POA: Diagnosis not present

## 2017-10-23 DIAGNOSIS — I110 Hypertensive heart disease with heart failure: Secondary | ICD-10-CM | POA: Diagnosis present

## 2017-10-23 DIAGNOSIS — I429 Cardiomyopathy, unspecified: Secondary | ICD-10-CM | POA: Diagnosis present

## 2017-10-23 DIAGNOSIS — D509 Iron deficiency anemia, unspecified: Secondary | ICD-10-CM | POA: Diagnosis present

## 2017-10-23 DIAGNOSIS — E1159 Type 2 diabetes mellitus with other circulatory complications: Secondary | ICD-10-CM | POA: Diagnosis present

## 2017-10-23 DIAGNOSIS — I739 Peripheral vascular disease, unspecified: Secondary | ICD-10-CM | POA: Diagnosis not present

## 2017-10-23 DIAGNOSIS — N189 Chronic kidney disease, unspecified: Secondary | ICD-10-CM | POA: Diagnosis not present

## 2017-10-23 DIAGNOSIS — I130 Hypertensive heart and chronic kidney disease with heart failure and stage 1 through stage 4 chronic kidney disease, or unspecified chronic kidney disease: Secondary | ICD-10-CM | POA: Diagnosis present

## 2017-10-23 DIAGNOSIS — E872 Acidosis: Secondary | ICD-10-CM | POA: Diagnosis not present

## 2017-10-23 DIAGNOSIS — N183 Chronic kidney disease, stage 3 (moderate): Secondary | ICD-10-CM | POA: Diagnosis present

## 2017-10-23 DIAGNOSIS — E785 Hyperlipidemia, unspecified: Secondary | ICD-10-CM | POA: Diagnosis present

## 2017-10-23 DIAGNOSIS — E46 Unspecified protein-calorie malnutrition: Secondary | ICD-10-CM | POA: Diagnosis not present

## 2017-10-23 DIAGNOSIS — Z794 Long term (current) use of insulin: Secondary | ICD-10-CM | POA: Diagnosis not present

## 2017-10-23 DIAGNOSIS — E1151 Type 2 diabetes mellitus with diabetic peripheral angiopathy without gangrene: Secondary | ICD-10-CM | POA: Diagnosis present

## 2017-10-23 DIAGNOSIS — R269 Unspecified abnormalities of gait and mobility: Secondary | ICD-10-CM | POA: Diagnosis present

## 2017-10-23 DIAGNOSIS — D61818 Other pancytopenia: Secondary | ICD-10-CM | POA: Diagnosis present

## 2017-10-23 DIAGNOSIS — E1169 Type 2 diabetes mellitus with other specified complication: Secondary | ICD-10-CM | POA: Diagnosis present

## 2017-10-23 DIAGNOSIS — R2681 Unsteadiness on feet: Secondary | ICD-10-CM | POA: Diagnosis not present

## 2017-10-23 DIAGNOSIS — B9620 Unspecified Escherichia coli [E. coli] as the cause of diseases classified elsewhere: Secondary | ICD-10-CM | POA: Diagnosis not present

## 2017-10-23 DIAGNOSIS — I509 Heart failure, unspecified: Secondary | ICD-10-CM | POA: Diagnosis present

## 2017-10-23 DIAGNOSIS — D899 Disorder involving the immune mechanism, unspecified: Secondary | ICD-10-CM | POA: Diagnosis not present

## 2017-10-23 DIAGNOSIS — R636 Underweight: Secondary | ICD-10-CM | POA: Diagnosis present

## 2017-10-23 DIAGNOSIS — M86271 Subacute osteomyelitis, right ankle and foot: Secondary | ICD-10-CM | POA: Diagnosis not present

## 2017-10-23 DIAGNOSIS — R197 Diarrhea, unspecified: Secondary | ICD-10-CM | POA: Diagnosis not present

## 2017-10-23 DIAGNOSIS — R112 Nausea with vomiting, unspecified: Secondary | ICD-10-CM | POA: Diagnosis not present

## 2017-10-23 DIAGNOSIS — R9431 Abnormal electrocardiogram [ECG] [EKG]: Secondary | ICD-10-CM | POA: Diagnosis not present

## 2017-10-23 DIAGNOSIS — A09 Infectious gastroenteritis and colitis, unspecified: Secondary | ICD-10-CM | POA: Diagnosis not present

## 2017-10-23 DIAGNOSIS — I129 Hypertensive chronic kidney disease with stage 1 through stage 4 chronic kidney disease, or unspecified chronic kidney disease: Secondary | ICD-10-CM | POA: Diagnosis not present

## 2017-10-23 DIAGNOSIS — K529 Noninfective gastroenteritis and colitis, unspecified: Secondary | ICD-10-CM | POA: Diagnosis not present

## 2017-10-23 DIAGNOSIS — E878 Other disorders of electrolyte and fluid balance, not elsewhere classified: Secondary | ICD-10-CM | POA: Diagnosis not present

## 2017-10-23 DIAGNOSIS — G4730 Sleep apnea, unspecified: Secondary | ICD-10-CM | POA: Diagnosis present

## 2017-10-23 DIAGNOSIS — Z681 Body mass index (BMI) 19 or less, adult: Secondary | ICD-10-CM | POA: Diagnosis not present

## 2017-10-23 DIAGNOSIS — E43 Unspecified severe protein-calorie malnutrition: Secondary | ICD-10-CM | POA: Diagnosis not present

## 2017-10-23 DIAGNOSIS — A0811 Acute gastroenteropathy due to Norwalk agent: Secondary | ICD-10-CM | POA: Diagnosis present

## 2017-10-23 DIAGNOSIS — D649 Anemia, unspecified: Secondary | ICD-10-CM | POA: Diagnosis not present

## 2017-10-23 DIAGNOSIS — Z7901 Long term (current) use of anticoagulants: Secondary | ICD-10-CM | POA: Diagnosis not present

## 2017-10-23 DIAGNOSIS — K861 Other chronic pancreatitis: Secondary | ICD-10-CM | POA: Diagnosis not present

## 2017-10-23 DIAGNOSIS — R945 Abnormal results of liver function studies: Secondary | ICD-10-CM | POA: Diagnosis present

## 2017-10-23 DIAGNOSIS — E1122 Type 2 diabetes mellitus with diabetic chronic kidney disease: Secondary | ICD-10-CM | POA: Diagnosis present

## 2017-10-23 DIAGNOSIS — M868X7 Other osteomyelitis, ankle and foot: Secondary | ICD-10-CM | POA: Diagnosis present

## 2017-10-23 DIAGNOSIS — I4891 Unspecified atrial fibrillation: Secondary | ICD-10-CM | POA: Diagnosis not present

## 2017-10-23 DIAGNOSIS — E860 Dehydration: Secondary | ICD-10-CM | POA: Diagnosis not present

## 2017-10-23 DIAGNOSIS — I2510 Atherosclerotic heart disease of native coronary artery without angina pectoris: Secondary | ICD-10-CM | POA: Diagnosis present

## 2017-10-23 DIAGNOSIS — I482 Chronic atrial fibrillation: Secondary | ICD-10-CM | POA: Diagnosis present

## 2017-10-23 DIAGNOSIS — Z79899 Other long term (current) drug therapy: Secondary | ICD-10-CM | POA: Diagnosis not present

## 2017-10-23 DIAGNOSIS — Z792 Long term (current) use of antibiotics: Secondary | ICD-10-CM | POA: Diagnosis not present

## 2017-10-23 DIAGNOSIS — R634 Abnormal weight loss: Secondary | ICD-10-CM | POA: Diagnosis not present

## 2017-10-23 DIAGNOSIS — Z940 Kidney transplant status: Secondary | ICD-10-CM | POA: Diagnosis not present

## 2017-10-24 DIAGNOSIS — R9431 Abnormal electrocardiogram [ECG] [EKG]: Secondary | ICD-10-CM | POA: Diagnosis not present

## 2017-10-24 DIAGNOSIS — I4891 Unspecified atrial fibrillation: Secondary | ICD-10-CM | POA: Diagnosis not present

## 2017-10-25 ENCOUNTER — Telehealth: Payer: Self-pay

## 2017-10-25 NOTE — Telephone Encounter (Signed)
Routed to Dr. Lorelei Pont  Copied from [place] [id number]. Topic: Inquiry >> Oct 25, 2017  2:09 PM Pricilla Handler wrote: Reason for CRM: Patient called and cancelled his Hospital Follow Up appt because he is in the hospital again. Patient wants Dr. Lorelei Pont to know.       Thank You!!!

## 2017-10-25 NOTE — Telephone Encounter (Signed)
[place]- I am sorry to hear it!

## 2017-10-27 ENCOUNTER — Inpatient Hospital Stay: Payer: Medicare Other | Admitting: Family Medicine

## 2017-10-27 MED ORDER — METOPROLOL SUCCINATE ER 50 MG PO TB24
150.00 | ORAL_TABLET | ORAL | Status: DC
Start: 2017-10-27 — End: 2017-10-27

## 2017-10-27 MED ORDER — HYDROCODONE-ACETAMINOPHEN 5-325 MG PO TABS
1.00 | ORAL_TABLET | ORAL | Status: DC
Start: ? — End: 2017-10-27

## 2017-10-27 MED ORDER — GENERIC EXTERNAL MEDICATION
4.00 g | Status: DC
Start: 2017-10-27 — End: 2017-10-27

## 2017-10-27 MED ORDER — APIXABAN 5 MG PO TABS
5.00 | ORAL_TABLET | ORAL | Status: DC
Start: 2017-10-27 — End: 2017-10-27

## 2017-10-27 MED ORDER — GENERIC EXTERNAL MEDICATION
2.00 g | Status: DC
Start: 2017-10-28 — End: 2017-10-27

## 2017-10-27 MED ORDER — FOLIC ACID 1 MG PO TABS
0.50 | ORAL_TABLET | ORAL | Status: DC
Start: 2017-10-28 — End: 2017-10-27

## 2017-10-27 MED ORDER — PREDNISONE 5 MG PO TABS
5.00 | ORAL_TABLET | ORAL | Status: DC
Start: 2017-10-28 — End: 2017-10-27

## 2017-10-27 MED ORDER — ZINC SULFATE 220 (50 ZN) MG PO CAPS
220.00 | ORAL_CAPSULE | ORAL | Status: DC
Start: 2017-10-28 — End: 2017-10-27

## 2017-10-27 MED ORDER — POLYSACCHARIDE IRON COMPLEX 150 MG PO CAPS
150.00 | ORAL_CAPSULE | ORAL | Status: DC
Start: 2017-10-28 — End: 2017-10-27

## 2017-10-27 MED ORDER — DEXTROSE 10 % IV SOLN
125.00 | INTRAVENOUS | Status: DC
Start: ? — End: 2017-10-27

## 2017-10-27 MED ORDER — GENERIC EXTERNAL MEDICATION
625.00 | Status: DC
Start: 2017-10-27 — End: 2017-10-27

## 2017-10-27 MED ORDER — ONDANSETRON 4 MG PO TBDP
4.00 | ORAL_TABLET | ORAL | Status: DC
Start: ? — End: 2017-10-27

## 2017-10-27 MED ORDER — CHOLECALCIFEROL 25 MCG (1000 UT) PO TABS
1000.00 | ORAL_TABLET | ORAL | Status: DC
Start: 2017-10-28 — End: 2017-10-27

## 2017-10-27 MED ORDER — NEPHRO-VITE 0.8 MG PO TABS
1.00 | ORAL_TABLET | ORAL | Status: DC
Start: 2017-10-28 — End: 2017-10-27

## 2017-10-27 MED ORDER — DOCOSAHEXAENOIC ACID PO
1000.00 | ORAL | Status: DC
Start: 2017-10-28 — End: 2017-10-27

## 2017-10-27 MED ORDER — DIPHENOXYLATE-ATROPINE 2.5-0.025 MG PO TABS
1.00 | ORAL_TABLET | ORAL | Status: DC
Start: 2017-10-27 — End: 2017-10-27

## 2017-10-27 MED ORDER — PANTOPRAZOLE SODIUM 40 MG PO TBEC
40.00 | DELAYED_RELEASE_TABLET | ORAL | Status: DC
Start: 2017-10-28 — End: 2017-10-27

## 2017-10-27 MED ORDER — GENERIC EXTERNAL MEDICATION
Status: DC
Start: ? — End: 2017-10-27

## 2017-10-27 MED ORDER — ACETAMINOPHEN 500 MG PO TABS
1000.00 | ORAL_TABLET | ORAL | Status: DC
Start: ? — End: 2017-10-27

## 2017-10-27 MED ORDER — INSULIN LISPRO 100 UNIT/ML ~~LOC~~ SOLN
1.00 | SUBCUTANEOUS | Status: DC
Start: 2017-10-28 — End: 2017-10-27

## 2017-10-27 MED ORDER — FINASTERIDE 5 MG PO TABS
5.00 | ORAL_TABLET | ORAL | Status: DC
Start: 2017-10-28 — End: 2017-10-27

## 2017-10-27 MED ORDER — GABAPENTIN 100 MG PO CAPS
300.00 | ORAL_CAPSULE | ORAL | Status: DC
Start: 2017-10-27 — End: 2017-10-27

## 2017-10-27 MED ORDER — VITAMIN B-12 1000 MCG PO TABS
1000.00 | ORAL_TABLET | ORAL | Status: DC
Start: 2017-10-28 — End: 2017-10-27

## 2017-10-27 MED ORDER — PANCRELIPASE (LIP-PROT-AMYL) 24000-76000 UNITS PO CPEP
90000.00 | ORAL_CAPSULE | ORAL | Status: DC
Start: 2017-10-28 — End: 2017-10-27

## 2017-10-27 MED ORDER — VITAMIN A 10000 UNITS PO CAPS
10000.00 | ORAL_CAPSULE | ORAL | Status: DC
Start: 2017-10-28 — End: 2017-10-27

## 2017-10-27 MED ORDER — FLUOROURACIL 5 % EX CREA
TOPICAL_CREAM | CUTANEOUS | Status: DC
Start: 2017-10-27 — End: 2017-10-27

## 2017-10-27 MED ORDER — GENERIC EXTERNAL MEDICATION
1.00 | Status: DC
Start: 2017-10-27 — End: 2017-10-27

## 2017-10-28 DIAGNOSIS — N3941 Urge incontinence: Secondary | ICD-10-CM | POA: Diagnosis not present

## 2017-10-28 DIAGNOSIS — D61818 Other pancytopenia: Secondary | ICD-10-CM | POA: Diagnosis not present

## 2017-10-28 DIAGNOSIS — I2510 Atherosclerotic heart disease of native coronary artery without angina pectoris: Secondary | ICD-10-CM | POA: Diagnosis not present

## 2017-10-28 DIAGNOSIS — E43 Unspecified severe protein-calorie malnutrition: Secondary | ICD-10-CM | POA: Diagnosis not present

## 2017-10-28 DIAGNOSIS — Z452 Encounter for adjustment and management of vascular access device: Secondary | ICD-10-CM | POA: Diagnosis not present

## 2017-10-28 DIAGNOSIS — M86271 Subacute osteomyelitis, right ankle and foot: Secondary | ICD-10-CM | POA: Diagnosis not present

## 2017-10-28 DIAGNOSIS — E785 Hyperlipidemia, unspecified: Secondary | ICD-10-CM | POA: Diagnosis not present

## 2017-10-28 DIAGNOSIS — Z4781 Encounter for orthopedic aftercare following surgical amputation: Secondary | ICD-10-CM | POA: Diagnosis not present

## 2017-10-28 DIAGNOSIS — Z792 Long term (current) use of antibiotics: Secondary | ICD-10-CM | POA: Diagnosis not present

## 2017-10-28 DIAGNOSIS — T8131XA Disruption of external operation (surgical) wound, not elsewhere classified, initial encounter: Secondary | ICD-10-CM | POA: Diagnosis not present

## 2017-10-28 DIAGNOSIS — N189 Chronic kidney disease, unspecified: Secondary | ICD-10-CM | POA: Diagnosis not present

## 2017-10-28 DIAGNOSIS — Z7901 Long term (current) use of anticoagulants: Secondary | ICD-10-CM | POA: Diagnosis not present

## 2017-10-28 DIAGNOSIS — E1151 Type 2 diabetes mellitus with diabetic peripheral angiopathy without gangrene: Secondary | ICD-10-CM | POA: Diagnosis not present

## 2017-10-28 DIAGNOSIS — I509 Heart failure, unspecified: Secondary | ICD-10-CM | POA: Diagnosis not present

## 2017-10-28 DIAGNOSIS — M109 Gout, unspecified: Secondary | ICD-10-CM | POA: Diagnosis not present

## 2017-10-28 DIAGNOSIS — H903 Sensorineural hearing loss, bilateral: Secondary | ICD-10-CM | POA: Diagnosis not present

## 2017-10-28 DIAGNOSIS — I481 Persistent atrial fibrillation: Secondary | ICD-10-CM | POA: Diagnosis not present

## 2017-10-28 DIAGNOSIS — E1169 Type 2 diabetes mellitus with other specified complication: Secondary | ICD-10-CM | POA: Diagnosis not present

## 2017-10-28 DIAGNOSIS — I429 Cardiomyopathy, unspecified: Secondary | ICD-10-CM | POA: Diagnosis not present

## 2017-10-28 DIAGNOSIS — K861 Other chronic pancreatitis: Secondary | ICD-10-CM | POA: Diagnosis not present

## 2017-10-28 DIAGNOSIS — Z7952 Long term (current) use of systemic steroids: Secondary | ICD-10-CM | POA: Diagnosis not present

## 2017-10-28 DIAGNOSIS — K529 Noninfective gastroenteritis and colitis, unspecified: Secondary | ICD-10-CM | POA: Diagnosis not present

## 2017-10-28 DIAGNOSIS — B9620 Unspecified Escherichia coli [E. coli] as the cause of diseases classified elsewhere: Secondary | ICD-10-CM | POA: Diagnosis not present

## 2017-10-28 DIAGNOSIS — I130 Hypertensive heart and chronic kidney disease with heart failure and stage 1 through stage 4 chronic kidney disease, or unspecified chronic kidney disease: Secondary | ICD-10-CM | POA: Diagnosis not present

## 2017-10-28 DIAGNOSIS — Z89411 Acquired absence of right great toe: Secondary | ICD-10-CM | POA: Diagnosis not present

## 2017-10-28 DIAGNOSIS — E1122 Type 2 diabetes mellitus with diabetic chronic kidney disease: Secondary | ICD-10-CM | POA: Diagnosis not present

## 2017-10-29 ENCOUNTER — Telehealth: Payer: Self-pay | Admitting: Family Medicine

## 2017-10-29 DIAGNOSIS — B9620 Unspecified Escherichia coli [E. coli] as the cause of diseases classified elsewhere: Secondary | ICD-10-CM | POA: Diagnosis not present

## 2017-10-29 DIAGNOSIS — Z4781 Encounter for orthopedic aftercare following surgical amputation: Secondary | ICD-10-CM | POA: Diagnosis not present

## 2017-10-29 DIAGNOSIS — I130 Hypertensive heart and chronic kidney disease with heart failure and stage 1 through stage 4 chronic kidney disease, or unspecified chronic kidney disease: Secondary | ICD-10-CM | POA: Diagnosis not present

## 2017-10-29 DIAGNOSIS — M86271 Subacute osteomyelitis, right ankle and foot: Secondary | ICD-10-CM | POA: Diagnosis not present

## 2017-10-29 DIAGNOSIS — E1169 Type 2 diabetes mellitus with other specified complication: Secondary | ICD-10-CM | POA: Diagnosis not present

## 2017-10-29 DIAGNOSIS — I509 Heart failure, unspecified: Secondary | ICD-10-CM | POA: Diagnosis not present

## 2017-10-29 NOTE — Telephone Encounter (Signed)
Copied from [place] #[id number]. >> Oct 29, 2017 12:36 PM Carolyn Stare wrote:  Wilhemena Durie with [facility] [place] call to ask for verbal orders PT 1 x 1 ,, 2 x 4    [phone]

## 2017-10-29 NOTE — Telephone Encounter (Signed)
Called and gave VO 

## 2017-11-01 ENCOUNTER — Other Ambulatory Visit: Payer: Self-pay | Admitting: Family Medicine

## 2017-11-01 DIAGNOSIS — M86271 Subacute osteomyelitis, right ankle and foot: Secondary | ICD-10-CM | POA: Diagnosis not present

## 2017-11-01 DIAGNOSIS — E1169 Type 2 diabetes mellitus with other specified complication: Secondary | ICD-10-CM | POA: Diagnosis not present

## 2017-11-01 DIAGNOSIS — Z4781 Encounter for orthopedic aftercare following surgical amputation: Secondary | ICD-10-CM | POA: Diagnosis not present

## 2017-11-01 DIAGNOSIS — I130 Hypertensive heart and chronic kidney disease with heart failure and stage 1 through stage 4 chronic kidney disease, or unspecified chronic kidney disease: Secondary | ICD-10-CM | POA: Diagnosis not present

## 2017-11-01 DIAGNOSIS — B9620 Unspecified Escherichia coli [E. coli] as the cause of diseases classified elsewhere: Secondary | ICD-10-CM | POA: Diagnosis not present

## 2017-11-01 DIAGNOSIS — I509 Heart failure, unspecified: Secondary | ICD-10-CM | POA: Diagnosis not present

## 2017-11-03 ENCOUNTER — Telehealth: Payer: Self-pay

## 2017-11-03 NOTE — Telephone Encounter (Signed)
Verbal orders have been given to home health.

## 2017-11-03 NOTE — Telephone Encounter (Signed)
Copied from [place] [id number]. Topic: Quick Communication - See Telephone Encounter >> Nov 01, 2017  2:14 PM Hewitt Shorts wrote: Home health is calling asking for 1 time a week for  1 week, 2 times week for 4 weeks for gait training and strength and balance   Best number is [phone]

## 2017-11-04 DIAGNOSIS — M19071 Primary osteoarthritis, right ankle and foot: Secondary | ICD-10-CM | POA: Diagnosis not present

## 2017-11-04 DIAGNOSIS — M79671 Pain in right foot: Secondary | ICD-10-CM | POA: Diagnosis not present

## 2017-11-04 DIAGNOSIS — M25571 Pain in right ankle and joints of right foot: Secondary | ICD-10-CM | POA: Diagnosis not present

## 2017-11-04 DIAGNOSIS — Z89421 Acquired absence of other right toe(s): Secondary | ICD-10-CM | POA: Diagnosis not present

## 2017-11-04 DIAGNOSIS — Z4789 Encounter for other orthopedic aftercare: Secondary | ICD-10-CM | POA: Diagnosis not present

## 2017-11-04 DIAGNOSIS — I998 Other disorder of circulatory system: Secondary | ICD-10-CM | POA: Diagnosis not present

## 2017-11-04 DIAGNOSIS — Z4802 Encounter for removal of sutures: Secondary | ICD-10-CM | POA: Diagnosis not present

## 2017-11-05 DIAGNOSIS — M86271 Subacute osteomyelitis, right ankle and foot: Secondary | ICD-10-CM | POA: Diagnosis not present

## 2017-11-05 DIAGNOSIS — Z4781 Encounter for orthopedic aftercare following surgical amputation: Secondary | ICD-10-CM | POA: Diagnosis not present

## 2017-11-05 DIAGNOSIS — I130 Hypertensive heart and chronic kidney disease with heart failure and stage 1 through stage 4 chronic kidney disease, or unspecified chronic kidney disease: Secondary | ICD-10-CM | POA: Diagnosis not present

## 2017-11-05 DIAGNOSIS — B9620 Unspecified Escherichia coli [E. coli] as the cause of diseases classified elsewhere: Secondary | ICD-10-CM | POA: Diagnosis not present

## 2017-11-05 DIAGNOSIS — I509 Heart failure, unspecified: Secondary | ICD-10-CM | POA: Diagnosis not present

## 2017-11-05 DIAGNOSIS — E1169 Type 2 diabetes mellitus with other specified complication: Secondary | ICD-10-CM | POA: Diagnosis not present

## 2017-11-07 ENCOUNTER — Telehealth: Payer: Self-pay | Admitting: Family Medicine

## 2017-11-07 DIAGNOSIS — G8929 Other chronic pain: Secondary | ICD-10-CM

## 2017-11-07 DIAGNOSIS — M25571 Pain in right ankle and joints of right foot: Principal | ICD-10-CM

## 2017-11-07 MED ORDER — HYDROCODONE-ACETAMINOPHEN 5-325 MG PO TABS: ORAL_TABLET | ORAL | 0 refills | Status: DC

## 2017-11-07 NOTE — Telephone Encounter (Signed)
Called pt- I was confused as to why this CBC was drawn on Monday but resulted just yesterday.  Pt reports that his blood was drawn by his home health nurse.  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  He is coming to see me on Wednesday.  He is running out of his pain pills and wonders if I can refill for him prior to our appt on Wednesday.  This is ok, will refill for him.  In any case he is not in distress or having any acute bleeding  10/29/2017  1  10/22/2017  Diphenoxylate-Atrop 2.5-0.025  90 30 [address]  [id number]  [place] ([id number])  1/1  Medicare  [place]  10/29/2017  1  10/22/2017  Hydrocodone-Acetamin 5-325 Mg  20 5 Ca Jon  [id number]  [place] ([id number])  1/1 20.00 MME Medicare  [place]  09/14/2017  1  09/14/2017  Hydrocodone-Acetamin 5-325 Mg  15 5 Je Cop  [id number]  [place] ([id number])  1/1 15.00 MME Medicare  [place]  09/08/2017  1  09/08/2017  Hydrocodone-Acetamin 5-325 Mg  15 2 Je Cop  [id number]  [place] ([id number])  1/1 37.50 MME Medicare  [place]  09/06/2017  1  08/27/2017  Diphenoxylate-Atrop 2.5-0.025  90 [place]  [id number]  [place] (1281)  1/3  Medicare  [place]  06/11/2017  1  02/22/2017  Diphenoxylate-Atrop 2.5-0.025  [address]  [id number]  [place] (9290)  2/3  Medicare    05/31/2017  1  05/31/2017  Hydrocodone-Acetamin 5-325 Mg  15 4 Ph Wil  [id number]  [place] ([id number])  1/1

## 2017-11-08 ENCOUNTER — Telehealth: Payer: Self-pay | Admitting: *Deleted

## 2017-11-08 DIAGNOSIS — B9620 Unspecified Escherichia coli [E. coli] as the cause of diseases classified elsewhere: Secondary | ICD-10-CM | POA: Diagnosis not present

## 2017-11-08 DIAGNOSIS — I130 Hypertensive heart and chronic kidney disease with heart failure and stage 1 through stage 4 chronic kidney disease, or unspecified chronic kidney disease: Secondary | ICD-10-CM | POA: Diagnosis not present

## 2017-11-08 DIAGNOSIS — E1169 Type 2 diabetes mellitus with other specified complication: Secondary | ICD-10-CM | POA: Diagnosis not present

## 2017-11-08 DIAGNOSIS — M86271 Subacute osteomyelitis, right ankle and foot: Secondary | ICD-10-CM | POA: Diagnosis not present

## 2017-11-08 DIAGNOSIS — Z4781 Encounter for orthopedic aftercare following surgical amputation: Secondary | ICD-10-CM | POA: Diagnosis not present

## 2017-11-08 DIAGNOSIS — I509 Heart failure, unspecified: Secondary | ICD-10-CM | POA: Diagnosis not present

## 2017-11-08 LAB — CBC AND DIFFERENTIAL
HCT: 32 — AB (ref 41–53)
Hemoglobin: 9.9 — AB (ref 13.5–17.5)
Platelets: 121 — AB (ref 150–399)
WBC: 2.5

## 2017-11-08 NOTE — Telephone Encounter (Signed)
Received Lab Report results from [place] [facility]; forwarded to provider/SLS 07/22  The DOB on the result report from [facility] is incorrect [08/18/1937]; changed on fax. Contacted patient, as note from Dr. Lorelei Pont yesterday stated that blood was drawn by his [place], to get the home health agency name. Pt did not know this information as his wife takes care of his medical information but she was unavailable at this time. Patient will have her call back and leave message for Dr. Lorelei Pont with the name. I have informed Dr. Lorelei Pont so that this information can be forwarded and I will find out if DOB error is on their record or if it was [facility]; so this can be corrected/SLS 07/22

## 2017-11-08 NOTE — Telephone Encounter (Signed)
Patient calling and states that the name of the group that does his blood draws at his home is [facility]. Clinical Coordinator: Lavetta Nielsen [phone] Office number: [phone]  CB#: [phone]

## 2017-11-09 ENCOUNTER — Telehealth: Payer: Self-pay | Admitting: Family Medicine

## 2017-11-09 DIAGNOSIS — E1169 Type 2 diabetes mellitus with other specified complication: Secondary | ICD-10-CM | POA: Diagnosis not present

## 2017-11-09 DIAGNOSIS — B9620 Unspecified Escherichia coli [E. coli] as the cause of diseases classified elsewhere: Secondary | ICD-10-CM | POA: Diagnosis not present

## 2017-11-09 DIAGNOSIS — Z4781 Encounter for orthopedic aftercare following surgical amputation: Secondary | ICD-10-CM | POA: Diagnosis not present

## 2017-11-09 DIAGNOSIS — I130 Hypertensive heart and chronic kidney disease with heart failure and stage 1 through stage 4 chronic kidney disease, or unspecified chronic kidney disease: Secondary | ICD-10-CM | POA: Diagnosis not present

## 2017-11-09 DIAGNOSIS — M86271 Subacute osteomyelitis, right ankle and foot: Secondary | ICD-10-CM | POA: Diagnosis not present

## 2017-11-09 DIAGNOSIS — I509 Heart failure, unspecified: Secondary | ICD-10-CM | POA: Diagnosis not present

## 2017-11-09 LAB — CBC WITH DIFFERENTIAL/PLATELET
Basophils Absolute: 0 10*3/uL (ref 0.0–0.2)
Basos: 0 %
EOS (ABSOLUTE): 0 10*3/uL (ref 0.0–0.4)
EOS: 0 %
HEMATOCRIT: 30.2 % — AB (ref 37.5–51.0)
Hemoglobin: 9.6 g/dL — ABNORMAL LOW (ref 13.0–17.7)
Immature Grans (Abs): 0 10*3/uL (ref 0.0–0.1)
Immature Granulocytes: 0 %
LYMPHS ABS: 0.5 10*3/uL — AB (ref 0.7–3.1)
Lymphs: 19 %
MCH: 29.4 pg (ref 26.6–33.0)
MCHC: 31.8 g/dL (ref 31.5–35.7)
MCV: 93 fL (ref 79–97)
MONOS ABS: 0.4 10*3/uL (ref 0.1–0.9)
Monocytes: 15 %
Neutrophils Absolute: 1.7 10*3/uL (ref 1.4–7.0)
Neutrophils: 66 %
Platelets: 129 10*3/uL — ABNORMAL LOW (ref 150–450)
RBC: 3.26 x10E6/uL — ABNORMAL LOW (ref 4.14–5.80)
RDW: 16 % — AB (ref 12.3–15.4)
WBC: 2.6 10*3/uL — ABNORMAL LOW (ref 3.4–10.8)

## 2017-11-09 NOTE — Telephone Encounter (Signed)
FYI

## 2017-11-09 NOTE — Telephone Encounter (Signed)
Called [facility] and spoke with [place] regarding the incorrect DOB on result fax from [facility] and trying to find out where the error happened; she will have the office call back when they open/SLS 07/23  Called [facility] and spoke with Truman Hayward regarding the incorrect DOB on result fax received by his company, and rying to find out where the error happened; he was very helpful and check patient in their system and it was found to be a transfer error. He made the correct in their system and faxed me a new note with correct DOB; forwarded to provider for initials for scan/SLS 07/23  Tried to reach [facility] physician line again to let them know it had been resolved but no answer/no ans machine/SLS 07/23

## 2017-11-09 NOTE — Telephone Encounter (Unsigned)
Copied from [place] [id number]. Topic: General - Other >> Nov 09, 2017 11:07 AM Carolyn Stare wrote:  Saralyn Pilar a PT with [facility]  call to say pt is having 9 out 10 pain in the right ankle and he wanted to report that .Maybe it can be dressed at his appt 11/10/17

## 2017-11-09 NOTE — Telephone Encounter (Signed)
Received Lab Report results from [place] via [facility] drawn on 11/08/17, abnormalities are minutely noticeable and patient has appointment with PCP tomorrow [11/10/17]; forwarded to provider/SLS 07/23

## 2017-11-10 ENCOUNTER — Ambulatory Visit (INDEPENDENT_AMBULATORY_CARE_PROVIDER_SITE_OTHER): Payer: Medicare Other | Admitting: Family Medicine

## 2017-11-10 ENCOUNTER — Encounter: Payer: Self-pay | Admitting: Family Medicine

## 2017-11-10 VITALS — BP 112/74 | HR 71 | Resp 16

## 2017-11-10 DIAGNOSIS — M25571 Pain in right ankle and joints of right foot: Secondary | ICD-10-CM | POA: Diagnosis not present

## 2017-11-10 DIAGNOSIS — Z940 Kidney transplant status: Secondary | ICD-10-CM

## 2017-11-10 DIAGNOSIS — M86271 Subacute osteomyelitis, right ankle and foot: Secondary | ICD-10-CM | POA: Diagnosis not present

## 2017-11-10 DIAGNOSIS — I509 Heart failure, unspecified: Secondary | ICD-10-CM | POA: Diagnosis not present

## 2017-11-10 DIAGNOSIS — I10 Essential (primary) hypertension: Secondary | ICD-10-CM

## 2017-11-10 DIAGNOSIS — F4320 Adjustment disorder, unspecified: Secondary | ICD-10-CM | POA: Diagnosis not present

## 2017-11-10 DIAGNOSIS — I480 Paroxysmal atrial fibrillation: Secondary | ICD-10-CM | POA: Diagnosis not present

## 2017-11-10 DIAGNOSIS — E118 Type 2 diabetes mellitus with unspecified complications: Secondary | ICD-10-CM

## 2017-11-10 DIAGNOSIS — G8929 Other chronic pain: Secondary | ICD-10-CM

## 2017-11-10 DIAGNOSIS — Z4781 Encounter for orthopedic aftercare following surgical amputation: Secondary | ICD-10-CM | POA: Diagnosis not present

## 2017-11-10 DIAGNOSIS — E1169 Type 2 diabetes mellitus with other specified complication: Secondary | ICD-10-CM | POA: Diagnosis not present

## 2017-11-10 DIAGNOSIS — B9620 Unspecified Escherichia coli [E. coli] as the cause of diseases classified elsewhere: Secondary | ICD-10-CM | POA: Diagnosis not present

## 2017-11-10 DIAGNOSIS — I130 Hypertensive heart and chronic kidney disease with heart failure and stage 1 through stage 4 chronic kidney disease, or unspecified chronic kidney disease: Secondary | ICD-10-CM | POA: Diagnosis not present

## 2017-11-10 MED ORDER — PAROXETINE HCL 10 MG PO TABS: 10.0000 mg | ORAL_TABLET | Freq: Every day | ORAL | 6 refills | Status: DC

## 2017-11-10 MED ORDER — HYDROCODONE-ACETAMINOPHEN 5-325 MG PO TABS: ORAL_TABLET | ORAL | 0 refills | Status: DC

## 2017-11-10 NOTE — Progress Notes (Signed)
[place] at [facility] [address], [place], [place] [address] [phone]  Date:  11/10/2017   Name:  Matthew Rowland   DOB:  May 22, 1945   MRN:  [id number]  PCP:  Darreld Mclean, MD    Chief Complaint: Hospitalization Follow-up (toe removal) and Ankle Pain (right ankle pain, no known cause, extremely painful)   History of Present Illness:  Matthew Rowland is a 72 y.o. very pleasant male patient who presents with the following:  Medically complex patient here today for a hospital follow-up visit.  I last saw him in early May.  He then saw Percell Miller for severe ankle pain, ended up getting a partial foot amputation for osteomyelitis in June. Of note this is a separate issue from the ankle pain!  Admitted at [facility] 09/23/2017-10/14/2017 -S/P right partial ray 09/29/17 -Right bone biopsy, I&D 10/06/2017 -Wound culture 6/12 1+ E. coli positive sensitive to ceftriaxone -Right ankle joint aspiration, biopsy/culture no growth -Ceftriaxone IV 2 g x 6 weeks end date 11/13/2017  He was discharged to rehab, but did not do well and was then admitted at [facility] earlier this month: For the details of admission, please see the H&P on 10/23/2017.  Matthew Rowland is a 72 y.o. Caucasian male with an extensive medical history such as CKD status post live donor renal transplant in 2014, CAD status post PCI in 2013, chronic atrial fibrillation on Eliquis, chronic pancreatitis complicated by pseudocyst, chronic diarrhea of unknown etiology, pad, type 2 diabetes, hypertension, chronic elevated transaminases, severe calorie and protein malnutrition, pancytopenia most likely secondary to immune suppression who presents from [facility] rehab facility for rapid unintentional weight loss of 15 pounds. Patient was found to be positive for norovirus which could be causing his worsening diarrhea   * Diarrhea of infectious origin  - Significantly improved  since arrival. Patient reports single, loosely formed BM yesterday evening for the past 24 hours - Chronic component has been previously evaluated in [place] and [place]. - GIP positive for norovirus, which is probable source of acute worsening of diarrhea (resulting in weight loss)  -- Unlikely to be a chronic infection, as patient with negative GIP in 2018. Patient's immunosuppressed state is likely a contributing factor in patient's ability to fight off viral infections  - Patient and wife express concern that cochicine may be a possible contributing factor as well, will continue to hold.  - Nutrition consulted and provided recommendations for patient  - Gold cards have been placed for outpatient follow up with both PCP and GI for continued evaluation and management.   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; normocytic iron deficiency anemia  Appears stable at this time  Recommend outpatient follow up with PCP and nephrology transplant team for continued evaluation and management   CKD (chronic kidney disease), history of kidney transplant, immunosuppression  LDRT in 2014, wife as donor in [place], [place] - Creatinine within baseline range  Avoid nephrotoxins (NSAIDS, regular insulin, morphine, IV contrast dye, FLEETS enema) - Daily CBC, BMP, Mag and Phos - Nephrology consulted; appreciate recommendations  -- Increase Prograf to 3mg  BID  -- Outpatient follow up with nephrology transplant team for continued evaluation and management   Underweight  Body mass index is 17.82 kg/m.  - Improved since admission  - Loss of >15% weight in last month - likely 2/2 to diarrhea (treatment as above)  - Nutritionist following, appreciate recs  WBC 2.7 (  L) 10/27/2017  HGB 10.0 (L) 10/27/2017  HCT 29.7 (L) 10/27/2017  PLT 78 (L) 10/27/2017  ALT 53 (H) 10/27/2017  AST 59 (H) 10/27/2017  NA 137 10/27/2017  K 4.8 10/27/2017  CL 110 10/27/2017  CREATININE 1.54 (H) 10/27/2017  BUN  37 (H) 10/27/2017  CO2 21 (L) 10/27/2017  TSH 2.881 09/23/2017  LABPROT 11.9 10/23/2017  INR 1.13 10/23/2017   -Chronic problem -Likely 2/2 iron deficiency -Iron supplementation as above   PAD (peripheral artery disease) (HCC)  Recent bilateral ABIs showed elevated ABI in the right suggestive of medial arterial calcification and severe arterial insufficiency -Has good pulses at bedside.  -Vascular follow up at discharge   Elevated LFTs, chronic  Stable Gold card has been placed for outpatient follow up with PCP and GI   Osteomyelitis of right ankle (HCC),S/P right fifth partial ray  .admitted at [facility] 09/23/2017-10/14/2017 -S/P right partial ray 09/29/17 -Right bone biopsy, I&D 10/06/2017 -Wound culture 6/12 1+ E. coli positive sensitive to ceftriaxone -Right ankle joint aspiration, biopsy/culture no growth -Ceftriaxone IV 2 g x 6 weeks end date 11/13/2017  PLAN: -Continue ceftriaxone IV 2 g every 24 hours --weekly CBC and CMP  -Dressing change per podiatry  Gait instability  - PT consulted and recommend home with 24/7 assist/supervision + HHPT - Fall precautions  Type 2 diabetes mellitus with circulatory disorder ([place])  HBA1C 5.5 10/23/2017   - Hold oral hypoglycemics  - SSI  - Hypoglycemia protocol - [facility] diet  - Continue to trend glucose and titrate medication dosages accordingly   Essential hypertension, cardiomyopathy, CAD, chronic atrial fibrillation  LVEF 40 to 45% PCI in 2013 EKG: Atrial fibrillation RVR -RVR was thought to be from dehydration as it resolved after fluid bolus  -Continue Eliquis and Metoprolol  Pt notes that since our last visit in May, he has seen ortho Sharol Given) who rx compression socks for him. He also saw Dr. Donnetta Hutching with vascular surgery who did not feel that his sx were due to vascular compormise   He got admitted at [facility] on 6/6- he was in for about 3 weeks.  This is when they noticed his infected and necrotic  foot and osteomyelitis; he got a partial ray amputation while he was inpt Then he was discharged to a rehab at [facility] for 8 days   He was sent home from rehab, but then was admitted again on 7/6- his most recently admission.  He was in for about 5 days  Since he got home from his most recent admission, home health is coming out to draw blood, they are using his PICC line to admin his IV abx daily.  He will be using this for 6 weeks total but he is nearly done  This is for the osteomyelitis which was due to E coli He is getting ceftriaxone 2gm daily   OT is supposed to come out and bring a shower chair for him soon PT is coming out to his home  Diarrhea is slowing down- he is on iron which may be helping with his diarrhea He is also on [place]   He find that his ankle is still really painful. We are not sure why this is. They did do a bone bx of this area during his admission in June and it was negative per his report.  His medial ankle is the painful area DR. Long is his podiatrist who did his amputation in June They saw Dr Laverta Baltimore last  week and she did a cortisone shot in his ankle but this did not unfortunately seem to help  He is not really gaining back any weight as of yet   He is feeling down and discouraged.  He will start to get some hope that he is getting better, but then he gets worse again He is not considering suicide but he is a bit hopeless He has a strong faith and feels certain that he is going to "a better place" after his life on [place].  He does admit to feeling depressed and would like to be treated for same.  He would like to gain wight- consider using paxil for him He is using hydrocodone for his ankle pain right now- he will take one at bedtime to help with sleep He also will take one in the am- may use 2 or 3 per day, up to a max of 4 per day He needs to continue pain control Explained that we will need a contract and UDS as we start more chronic pain management  and he is ok with this  11/07/2017  1  11/07/2017  Hydrocodone-Acetamin 5-325 Mg  20 7 Je Cop  [id number]  [place] ([id number])  1/1 14.29 MME Medicare  [place]  10/29/2017  1  10/22/2017  Diphenoxylate-Atrop 2.5-0.025  90 30 Ca Jon  [id number]  [place] ([id number])  1/1  Medicare  [place]  10/29/2017  1  10/22/2017  Hydrocodone-Acetamin 5-325 Mg  20 5 Ca Jon  [id number]  [place] ([id number])  1/1 20.00 MME Medicare  [place]  09/14/2017  1  09/14/2017  Hydrocodone-Acetamin 5-325 Mg  15 5 Je Cop  [id number]  [place] ([id number])  1/1       He is wearing a hard backed walking type spline on the right foot and ankle  He is walking short distances at home with a walker.   He stopped the glimepiride as his glucose is running too low His most recent creat was 1.54 and GFR was 45 on 10/27/17   Patient Active Problem List   Diagnosis Date Noted  . Squamous cell carcinoma of skin of face 06/02/2017  . Elbow injury, right, subsequent encounter 05/20/2016  . Fracture of right scapular body 05/20/2016  . Chronic combined systolic and diastolic CHF (congestive heart failure) ([place]) 03/29/2016  . Cardiomyopathy ([place]) 03/23/2016  . 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 Essential hypertension 01/15/2016  . H/O acute pancreatitis 01/15/2016  . Immunosuppression ([place]) 01/15/2016  . Type 2 diabetes mellitus with circulatory disorder ([place]) 01/15/2016  . Hx of kidney transplant 01/04/2016  . Folate deficiency 12/08/2015  . Pancytopenia ([place]) 12/08/2015  . CKD (chronic kidney disease) 01/03/2015  . Atrial fibrillation, persistent ([place]) 11/19/2014  . SSS (sick sinus syndrome) ([place]) 02/09/2014  . CAD (coronary artery disease) 02/21/2013    Past Medical History:  Diagnosis Date  . A-fib ([place])   . CHF (congestive heart failure) ([place])   . Chronic diarrhea   . Diabetes mellitus without complication (HCC)    borderline  . History of ITP   . Hypertension   . Pancreatitis   . Renal disorder     Past Surgical History:  Procedure  Laterality Date  . ABDOMINAL SURGERY    . CHOLECYSTECTOMY    . KIDNEY TRANSPLANT      Social History   Tobacco Use  . Smoking status: Never Smoker  . Smokeless tobacco: Never Used  Substance Use Topics  . Alcohol use: No  .  Drug use: No    History reviewed. No pertinent family history.  Allergies  Allergen Reactions  . Atorvastatin Itching    Incessant itching which stopped when he stopped the atorvastatin.  [place] Kitchen Lisinopril Other (See Comments)    Causes pancreatitis     Medication list has been reviewed and updated.  Current Outpatient Medications on File Prior to Visit  Medication Sig Dispense Refill  . ACCU-CHEK FASTCLIX LANCETS MISC CHECK GLUCOSE 2 TIMES DAILY. 102 each 2  . Apixaban (ELIQUIS PO) Take 5 mg by mouth 2 (two) times daily. Patient does not remember dose     . cholestyramine (QUESTRAN) 4 g packet Take 4 g by mouth 2 (two) times daily.    . diphenoxylate-atropine (LOMOTIL) 2.5-0.025 MG tablet TK 1 T PO TID PRF DH  2  . FERREX 150 150 MG capsule Take by mouth daily.  0  . finasteride (PROSCAR) 5 MG tablet     . gabapentin (NEURONTIN) 600 MG tablet Take by mouth.    Marland Kitchen glucose blood (ACCU-CHEK GUIDE) test strip Check glucose 1-2 times daily 100 each 12  . HYDROcodone-acetaminophen (NORCO/VICODIN) 5-325 MG tablet Take 1/2 or 1 tablet every 8 hours as needed for pain 20 tablet 0  . ipratropium (ATROVENT) 0.03 % nasal spray Place 2 sprays into the nose 4 (four) times daily. Use as needed for nasal drainage 30 mL 6  . metoprolol succinate (TOPROL-XL) 100 MG 24 hr tablet Pt is taking 100 mg and 50 mg twice a day for total of 150 mg BID 1 tablet 0  . Pancrelipase, Lip-Prot-Amyl, (CREON PO) Take by mouth.    . pantoprazole (PROTONIX) 40 MG tablet Take 40 mg by mouth daily.    . polycarbophil (FIBERCON) 625 MG tablet Take 2 tablets (1,250 mg total) by mouth daily. 30 tablet 0  . prednisoLONE 5 MG TABS tablet Take by mouth.    . predniSONE (DELTASONE) 5 MG tablet Take 5  mg by mouth.    . tacrolimus (PROGRAF) 1 MG capsule Take 1 mg by mouth 2 (two) times daily.    Marland Kitchen glimepiride (AMARYL) 1 MG tablet TAKE 1 TABLET BY MOUTH EVERY DAY BY MOUTH WITH BREAKFAST (Patient not taking: Reported on 11/10/2017) 30 tablet 5   No current facility-administered medications on file prior to visit.     Review of Systems:  As per HPI- otherwise negative. No fever or chills Appetite is not great    Physical Examination: Vitals:   11/10/17 1039  BP: 112/74  Pulse: 71  Resp: 16  SpO2: 100%   Vitals:   There is no height or weight on file to calculate BMI. Ideal Body Weight:    GEN: WDWN, NAD, Non-toxic, A & O x 3, thin, chronically ill appearing man accompanied by his wife today HEENT: Atraumatic, Normocephalic. Neck supple. No masses, No LAD. Ears and Nose: No external deformity. CV: rate controlled a fib, No M/G/R. No JVD. No thrill. No extra heart sounds. PULM: CTA B, no wheezes, crackles, rhonchi. No retractions. No resp. distress. No accessory muscle use. ABD: S, NT, ND EXTR: No c/c/e NEURO using WC today as he did not bring his walker  Right foot amputation site appears to be healing well, no redness or really any tenderness. He still has a lot of tenderness of the medial ankle over the medial malleolus. However he has ok ROM of the ankle and it does not appear to be a septic joint.  No significant swelling of the  ankle either  PSYCH: Normally interactive. Conversant. Not depressed or anxious appearing.  Calm demeanor.    Assessment and Plan: Adjustment disorder, unspecified type - Plan: PARoxetine (PAXIL) 10 MG tablet  Chronic pain of right ankle - Plan: HYDROcodone-acetaminophen (NORCO/VICODIN) 5-325 MG tablet, Pain Mgmt, Profile 8 w/Conf, U  Essential hypertension  Controlled type 2 diabetes mellitus with complication, without long-term current use of insulin (HCC)  Hx of kidney transplant  Paroxysmal atrial fibrillation ([place])  Meds ordered this  encounter  Medications  . PARoxetine (PAXIL) 10 MG tablet    Sig: Take 1 tablet (10 mg total) by mouth daily.    Dispense:  30 tablet    Refill:  6  . HYDROcodone-acetaminophen (NORCO/VICODIN) 5-325 MG tablet    Sig: Take 1/2 or 1 tablet every 6 hours as needed for pain    Dispense:  120 tablet    Refill:  0   Controlled substance contract and UDS today  Refilled his hydrocodone  Start on paxil 10 mg for depression and also in hopes of stimulating weight gain He stopped his amaryl due to hypoglycemia, this is fine. He is very underweight right now and doubt he will need glucose control given his current malnourished state. He is trying to eat as well as he can  BP is in ok range at this time Shared with pt and his wife that I am not sure why his ankle is still so painful.  We will work on this together    Signed Lamar Blinks, MD  Have placed a call to Dr. Isabella Bowens, his podiatrist with [phone]. Would like to ask her about any need to increase duration of rocephin and also what ideas she has about the cause of his right ankle pain   7/25- spoke with Dr. Laverta Baltimore.  She does not feel that we need to prolong abx.  She also does not know etiology of ankle pain.  She did a bone bx for culture but it was negative for infection.  Called pt and LMOM- will need to keep looking   Called and spoke with pt on 7/26.  We talked about options. He plans to see Dr. Sharol Given asap to discuss his ankle pain. However if not helpful we might have him see neurology for nerve conduction.  Also we started to discuss the possibility of amputation if he continues to have uncontrolled pain over the long term

## 2017-11-10 NOTE — Patient Instructions (Addendum)
It was good to see you today- we are going to start you on Paxil 10 mg daily to help with depression and appetite  Please stop by the lab for a urine drug screen today  We will start managing your pain with hydrocodone as needed until we find a cause for your pain   Please plan to see me in 4 weeks to check on how you are doing

## 2017-11-11 DIAGNOSIS — Z4781 Encounter for orthopedic aftercare following surgical amputation: Secondary | ICD-10-CM | POA: Diagnosis not present

## 2017-11-11 DIAGNOSIS — I509 Heart failure, unspecified: Secondary | ICD-10-CM | POA: Diagnosis not present

## 2017-11-11 DIAGNOSIS — M86271 Subacute osteomyelitis, right ankle and foot: Secondary | ICD-10-CM | POA: Diagnosis not present

## 2017-11-11 DIAGNOSIS — B9620 Unspecified Escherichia coli [E. coli] as the cause of diseases classified elsewhere: Secondary | ICD-10-CM | POA: Diagnosis not present

## 2017-11-11 DIAGNOSIS — I130 Hypertensive heart and chronic kidney disease with heart failure and stage 1 through stage 4 chronic kidney disease, or unspecified chronic kidney disease: Secondary | ICD-10-CM | POA: Diagnosis not present

## 2017-11-11 DIAGNOSIS — E1169 Type 2 diabetes mellitus with other specified complication: Secondary | ICD-10-CM | POA: Diagnosis not present

## 2017-11-12 DIAGNOSIS — E46 Unspecified protein-calorie malnutrition: Secondary | ICD-10-CM | POA: Diagnosis not present

## 2017-11-12 DIAGNOSIS — K529 Noninfective gastroenteritis and colitis, unspecified: Secondary | ICD-10-CM | POA: Diagnosis not present

## 2017-11-12 DIAGNOSIS — R634 Abnormal weight loss: Secondary | ICD-10-CM | POA: Diagnosis not present

## 2017-11-12 DIAGNOSIS — Z681 Body mass index (BMI) 19 or less, adult: Secondary | ICD-10-CM | POA: Diagnosis not present

## 2017-11-12 DIAGNOSIS — Z888 Allergy status to other drugs, medicaments and biological substances status: Secondary | ICD-10-CM | POA: Diagnosis not present

## 2017-11-12 DIAGNOSIS — K915 Postcholecystectomy syndrome: Secondary | ICD-10-CM | POA: Diagnosis not present

## 2017-11-14 LAB — PAIN MGMT, PROFILE 8 W/CONF, U
6 Acetylmorphine: NEGATIVE ng/mL (ref <10)
AMPHETAMINES: NEGATIVE ng/mL (ref <500)
Alcohol Metabolites: NEGATIVE ng/mL (ref <500)
Benzodiazepines: NEGATIVE ng/mL (ref <100)
Buprenorphine, Urine: NEGATIVE ng/mL (ref <5)
CODEINE: NEGATIVE ng/mL (ref <50)
Cocaine Metabolite: NEGATIVE ng/mL (ref <150)
Creatinine: 89.8 mg/dL
HYDROMORPHONE: 726 ng/mL — AB (ref <50)
Hydrocodone: 975 ng/mL — ABNORMAL HIGH (ref <50)
MDMA: NEGATIVE ng/mL (ref <500)
Marijuana Metabolite: NEGATIVE ng/mL (ref <20)
Morphine: NEGATIVE ng/mL (ref <50)
Norhydrocodone: 507 ng/mL — ABNORMAL HIGH (ref <50)
OPIATES: POSITIVE ng/mL — AB (ref <100)
OXIDANT: NEGATIVE ug/mL (ref <200)
OXYCODONE: NEGATIVE ng/mL (ref <100)
pH: 6.35 (ref 4.5–9.0)

## 2017-11-15 DIAGNOSIS — Z792 Long term (current) use of antibiotics: Secondary | ICD-10-CM | POA: Diagnosis not present

## 2017-11-15 DIAGNOSIS — Z4781 Encounter for orthopedic aftercare following surgical amputation: Secondary | ICD-10-CM | POA: Diagnosis not present

## 2017-11-15 DIAGNOSIS — B9620 Unspecified Escherichia coli [E. coli] as the cause of diseases classified elsewhere: Secondary | ICD-10-CM | POA: Diagnosis not present

## 2017-11-15 DIAGNOSIS — E1169 Type 2 diabetes mellitus with other specified complication: Secondary | ICD-10-CM | POA: Diagnosis not present

## 2017-11-15 DIAGNOSIS — I130 Hypertensive heart and chronic kidney disease with heart failure and stage 1 through stage 4 chronic kidney disease, or unspecified chronic kidney disease: Secondary | ICD-10-CM | POA: Diagnosis not present

## 2017-11-15 DIAGNOSIS — M86271 Subacute osteomyelitis, right ankle and foot: Secondary | ICD-10-CM | POA: Diagnosis not present

## 2017-11-15 DIAGNOSIS — I509 Heart failure, unspecified: Secondary | ICD-10-CM | POA: Diagnosis not present

## 2017-11-16 ENCOUNTER — Encounter: Payer: Self-pay | Admitting: Family Medicine

## 2017-11-16 LAB — CBC AND DIFFERENTIAL
HEMATOCRIT: 36 — AB (ref 41–53)
HEMOGLOBIN: 10.8 — AB (ref 13.5–17.5)
Platelets: 123 — AB (ref 150–399)
WBC: 2.5

## 2017-11-16 LAB — HEPATIC FUNCTION PANEL
ALT: 125 — AB (ref 10–40)
AST: 101 — AB (ref 14–40)

## 2017-11-16 LAB — BASIC METABOLIC PANEL
BUN: 49 — AB (ref 4–21)
Creatinine: 1.7 — AB (ref 0.6–1.3)
Potassium: 5.1 (ref 3.4–5.3)
SODIUM: 138 (ref 137–147)

## 2017-11-17 DIAGNOSIS — Z4781 Encounter for orthopedic aftercare following surgical amputation: Secondary | ICD-10-CM | POA: Diagnosis not present

## 2017-11-17 DIAGNOSIS — B9620 Unspecified Escherichia coli [E. coli] as the cause of diseases classified elsewhere: Secondary | ICD-10-CM | POA: Diagnosis not present

## 2017-11-17 DIAGNOSIS — M86271 Subacute osteomyelitis, right ankle and foot: Secondary | ICD-10-CM | POA: Diagnosis not present

## 2017-11-17 DIAGNOSIS — I509 Heart failure, unspecified: Secondary | ICD-10-CM | POA: Diagnosis not present

## 2017-11-17 DIAGNOSIS — E1169 Type 2 diabetes mellitus with other specified complication: Secondary | ICD-10-CM | POA: Diagnosis not present

## 2017-11-17 DIAGNOSIS — I130 Hypertensive heart and chronic kidney disease with heart failure and stage 1 through stage 4 chronic kidney disease, or unspecified chronic kidney disease: Secondary | ICD-10-CM | POA: Diagnosis not present

## 2017-11-18 DIAGNOSIS — M86271 Subacute osteomyelitis, right ankle and foot: Secondary | ICD-10-CM | POA: Diagnosis not present

## 2017-11-18 DIAGNOSIS — I509 Heart failure, unspecified: Secondary | ICD-10-CM | POA: Diagnosis not present

## 2017-11-18 DIAGNOSIS — E1169 Type 2 diabetes mellitus with other specified complication: Secondary | ICD-10-CM | POA: Diagnosis not present

## 2017-11-18 DIAGNOSIS — Z4781 Encounter for orthopedic aftercare following surgical amputation: Secondary | ICD-10-CM | POA: Diagnosis not present

## 2017-11-18 DIAGNOSIS — I130 Hypertensive heart and chronic kidney disease with heart failure and stage 1 through stage 4 chronic kidney disease, or unspecified chronic kidney disease: Secondary | ICD-10-CM | POA: Diagnosis not present

## 2017-11-18 DIAGNOSIS — B9620 Unspecified Escherichia coli [E. coli] as the cause of diseases classified elsewhere: Secondary | ICD-10-CM | POA: Diagnosis not present

## 2017-11-22 ENCOUNTER — Telehealth: Payer: Self-pay | Admitting: *Deleted

## 2017-11-22 DIAGNOSIS — I130 Hypertensive heart and chronic kidney disease with heart failure and stage 1 through stage 4 chronic kidney disease, or unspecified chronic kidney disease: Secondary | ICD-10-CM | POA: Diagnosis not present

## 2017-11-22 DIAGNOSIS — B9620 Unspecified Escherichia coli [E. coli] as the cause of diseases classified elsewhere: Secondary | ICD-10-CM | POA: Diagnosis not present

## 2017-11-22 DIAGNOSIS — M86271 Subacute osteomyelitis, right ankle and foot: Secondary | ICD-10-CM | POA: Diagnosis not present

## 2017-11-22 DIAGNOSIS — I509 Heart failure, unspecified: Secondary | ICD-10-CM | POA: Diagnosis not present

## 2017-11-22 DIAGNOSIS — Z4781 Encounter for orthopedic aftercare following surgical amputation: Secondary | ICD-10-CM | POA: Diagnosis not present

## 2017-11-22 DIAGNOSIS — E1169 Type 2 diabetes mellitus with other specified complication: Secondary | ICD-10-CM | POA: Diagnosis not present

## 2017-11-22 NOTE — Telephone Encounter (Signed)
Received Lab Report results from [facility]; forwarded to provider/SLS 08/05

## 2017-11-23 ENCOUNTER — Ambulatory Visit (INDEPENDENT_AMBULATORY_CARE_PROVIDER_SITE_OTHER): Payer: Medicare Other | Admitting: Orthopedic Surgery

## 2017-11-23 ENCOUNTER — Encounter (INDEPENDENT_AMBULATORY_CARE_PROVIDER_SITE_OTHER): Payer: Self-pay | Admitting: Orthopedic Surgery

## 2017-11-23 DIAGNOSIS — M86171 Other acute osteomyelitis, right ankle and foot: Secondary | ICD-10-CM | POA: Diagnosis not present

## 2017-11-23 DIAGNOSIS — E43 Unspecified severe protein-calorie malnutrition: Secondary | ICD-10-CM | POA: Diagnosis not present

## 2017-11-23 DIAGNOSIS — I87321 Chronic venous hypertension (idiopathic) with inflammation of right lower extremity: Secondary | ICD-10-CM

## 2017-11-23 DIAGNOSIS — M1A09X0 Idiopathic chronic gout, multiple sites, without tophus (tophi): Secondary | ICD-10-CM

## 2017-11-23 DIAGNOSIS — N183 Chronic kidney disease, stage 3 (moderate): Secondary | ICD-10-CM | POA: Diagnosis not present

## 2017-11-23 DIAGNOSIS — M25571 Pain in right ankle and joints of right foot: Secondary | ICD-10-CM | POA: Diagnosis not present

## 2017-11-23 DIAGNOSIS — M86671 Other chronic osteomyelitis, right ankle and foot: Secondary | ICD-10-CM | POA: Diagnosis not present

## 2017-11-23 DIAGNOSIS — K861 Other chronic pancreatitis: Secondary | ICD-10-CM | POA: Diagnosis not present

## 2017-11-23 DIAGNOSIS — M79671 Pain in right foot: Secondary | ICD-10-CM | POA: Diagnosis not present

## 2017-11-23 DIAGNOSIS — L03115 Cellulitis of right lower limb: Secondary | ICD-10-CM | POA: Diagnosis not present

## 2017-11-23 DIAGNOSIS — D899 Disorder involving the immune mechanism, unspecified: Secondary | ICD-10-CM | POA: Diagnosis not present

## 2017-11-23 DIAGNOSIS — Z89421 Acquired absence of other right toe(s): Secondary | ICD-10-CM | POA: Diagnosis not present

## 2017-11-23 DIAGNOSIS — D631 Anemia in chronic kidney disease: Secondary | ICD-10-CM | POA: Diagnosis not present

## 2017-11-23 DIAGNOSIS — I481 Persistent atrial fibrillation: Secondary | ICD-10-CM | POA: Diagnosis not present

## 2017-11-23 DIAGNOSIS — I864 Gastric varices: Secondary | ICD-10-CM | POA: Diagnosis not present

## 2017-11-23 DIAGNOSIS — D529 Folate deficiency anemia, unspecified: Secondary | ICD-10-CM | POA: Diagnosis not present

## 2017-11-23 DIAGNOSIS — Z79899 Other long term (current) drug therapy: Secondary | ICD-10-CM | POA: Diagnosis not present

## 2017-11-23 DIAGNOSIS — Z940 Kidney transplant status: Secondary | ICD-10-CM | POA: Diagnosis not present

## 2017-11-23 DIAGNOSIS — E1169 Type 2 diabetes mellitus with other specified complication: Secondary | ICD-10-CM | POA: Diagnosis not present

## 2017-11-23 DIAGNOSIS — I129 Hypertensive chronic kidney disease with stage 1 through stage 4 chronic kidney disease, or unspecified chronic kidney disease: Secondary | ICD-10-CM | POA: Diagnosis not present

## 2017-11-23 MED ORDER — COLCHICINE 0.6 MG PO TABS: 0.6000 mg | ORAL_TABLET | Freq: Every day | ORAL | 3 refills | Status: DC

## 2017-11-23 NOTE — Progress Notes (Signed)
Office Visit Note   Patient: Matthew Rowland           Date of Birth: Mar 20, 1946           MRN: [id number] Visit Date: 11/23/2017              Requested by: Darreld Mclean, MD [address] [place], [place] [address] PCP: Darreld Mclean, MD  Chief Complaint  Patient presents with  . Right Foot - Follow-up      HPI: Patient is a 72 year old gentleman with history of gout he states that most of his uric acid levels have been normal after an acute attack.  Patient states he is status post fifth ray amputation of the right foot at [facility] and had his ankle injected with cortisone with temporary relief.  Assessment & Plan: Visit Diagnoses:  1. Idiopathic chronic gout of multiple sites without tophus   2. 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  Reevaluate in 2 weeks.  Follow-Up Instructions: Return in about 2 weeks (around 12/07/2017).   Ortho Exam  Patient is alert, oriented, no adenopathy, well-dressed, normal affect, normal respiratory effort. Examination patient has venous stasis swelling in the right foot he has a well-healed fifth ray amputation on the right.  He has dry cracked skin from the swelling.  There is redness and tenderness to palpation of the ankle but no cellulitis.  Patient clinically has gout he recently has had an aspiration and injection of the medial joint line.  Patient has a good dorsalis pedis pulse.  Most recent hemoglobin A1c was 10.2.  Patient has pain with weightbearing he states he is lost a lot of weight he is currently 130 pounds over 6 feet tall patient states he has been worked up with his primary care physician and  work-up was negative for cancer.  Imaging: No results found. No images are attached to the encounter.  Labs: Lab Results  Component Value Date   HGBA1C 10.2 (H) 12/24/2016   ESRSEDRATE 11 09/03/2017   CRP 1.2 (H) 09/03/2017   LABURIC 5.9 09/20/2017   LABURIC 6.6 08/12/2017     Lab Results  Component Value Date   ALBUMIN 3.7 08/18/2017   ALBUMIN 3.7 08/12/2017   LABURIC 5.9 09/20/2017   LABURIC 6.6 08/12/2017    There is no height or weight on file to calculate BMI.  Orders:  No orders of the defined types were placed in this encounter.  Meds ordered this encounter  Medications  . colchicine 0.6 MG tablet    Sig: Take 1 tablet (0.6 mg total) by mouth daily.    Dispense:  30 tablet    Refill:  3     Procedures: No procedures performed  Clinical Data: No additional findings.  ROS:  All other systems negative, except as noted in the HPI. Review of Systems  Objective: Vital Signs: There were no vitals taken for this visit.  Specialty Comments:  No specialty comments available.  PMFS History: Patient Active Problem List   Diagnosis Date Noted  . Squamous cell carcinoma of skin of face 06/02/2017  . Elbow injury, right, subsequent encounter 05/20/2016  . Fracture of right  scapular body 05/20/2016  . Chronic combined systolic and diastolic CHF (congestive heart failure) ([place]) 03/29/2016  . Cardiomyopathy ([place]) 03/23/2016  . 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 Essential hypertension 01/15/2016  . H/O acute pancreatitis 01/15/2016  . Immunosuppression ([place]) 01/15/2016  . Type 2 diabetes mellitus with circulatory disorder ([place]) 01/15/2016  . Hx of kidney transplant 01/04/2016  . Folate deficiency 12/08/2015  . Pancytopenia ([place]) 12/08/2015  . CKD (chronic kidney disease) 01/03/2015  . Atrial fibrillation, persistent ([place]) 11/19/2014  . SSS (sick sinus syndrome) ([place]) 02/09/2014  . CAD (coronary artery disease) 02/21/2013     Past Medical History:  Diagnosis Date  . A-fib ([place])   . CHF (congestive heart failure) ([place])   . Chronic diarrhea   . Diabetes mellitus without complication (HCC)    borderline  . History of ITP   . Hypertension   . Pancreatitis   . Renal disorder     History reviewed. No pertinent family history.  Past Surgical History:  Procedure Laterality Date  . ABDOMINAL SURGERY    . CHOLECYSTECTOMY    . KIDNEY TRANSPLANT     Social History   Occupational History  . Not on file  Tobacco Use  . Smoking status: Never Smoker  . Smokeless tobacco: Never Used  Substance and Sexual Activity  . Alcohol use: No  . Drug use: No  . Sexual activity: Not on file

## 2017-11-24 ENCOUNTER — Ambulatory Visit: Payer: Self-pay | Admitting: Family Medicine

## 2017-11-24 ENCOUNTER — Telehealth: Payer: Self-pay

## 2017-11-24 DIAGNOSIS — I509 Heart failure, unspecified: Secondary | ICD-10-CM | POA: Diagnosis present

## 2017-11-24 DIAGNOSIS — I864 Gastric varices: Secondary | ICD-10-CM | POA: Diagnosis not present

## 2017-11-24 DIAGNOSIS — M109 Gout, unspecified: Secondary | ICD-10-CM | POA: Diagnosis present

## 2017-11-24 DIAGNOSIS — I2510 Atherosclerotic heart disease of native coronary artery without angina pectoris: Secondary | ICD-10-CM | POA: Diagnosis present

## 2017-11-24 DIAGNOSIS — E43 Unspecified severe protein-calorie malnutrition: Secondary | ICD-10-CM | POA: Diagnosis present

## 2017-11-24 DIAGNOSIS — D649 Anemia, unspecified: Secondary | ICD-10-CM | POA: Diagnosis not present

## 2017-11-24 DIAGNOSIS — Z89421 Acquired absence of other right toe(s): Secondary | ICD-10-CM | POA: Diagnosis not present

## 2017-11-24 DIAGNOSIS — M25571 Pain in right ankle and joints of right foot: Secondary | ICD-10-CM | POA: Diagnosis not present

## 2017-11-24 DIAGNOSIS — R000 Tachycardia, unspecified: Secondary | ICD-10-CM | POA: Diagnosis not present

## 2017-11-24 DIAGNOSIS — R740 Nonspecific elevation of levels of transaminase and lactic acid dehydrogenase [LDH]: Secondary | ICD-10-CM | POA: Diagnosis present

## 2017-11-24 DIAGNOSIS — E1151 Type 2 diabetes mellitus with diabetic peripheral angiopathy without gangrene: Secondary | ICD-10-CM | POA: Diagnosis present

## 2017-11-24 DIAGNOSIS — D899 Disorder involving the immune mechanism, unspecified: Secondary | ICD-10-CM | POA: Diagnosis not present

## 2017-11-24 DIAGNOSIS — Z4781 Encounter for orthopedic aftercare following surgical amputation: Secondary | ICD-10-CM | POA: Diagnosis not present

## 2017-11-24 DIAGNOSIS — Z7901 Long term (current) use of anticoagulants: Secondary | ICD-10-CM | POA: Diagnosis not present

## 2017-11-24 DIAGNOSIS — I959 Hypotension, unspecified: Secondary | ICD-10-CM | POA: Diagnosis not present

## 2017-11-24 DIAGNOSIS — E8809 Other disorders of plasma-protein metabolism, not elsewhere classified: Secondary | ICD-10-CM | POA: Diagnosis not present

## 2017-11-24 DIAGNOSIS — L03115 Cellulitis of right lower limb: Secondary | ICD-10-CM | POA: Diagnosis not present

## 2017-11-24 DIAGNOSIS — B9620 Unspecified Escherichia coli [E. coli] as the cause of diseases classified elsewhere: Secondary | ICD-10-CM | POA: Diagnosis not present

## 2017-11-24 DIAGNOSIS — I4891 Unspecified atrial fibrillation: Secondary | ICD-10-CM | POA: Diagnosis not present

## 2017-11-24 DIAGNOSIS — Z681 Body mass index (BMI) 19 or less, adult: Secondary | ICD-10-CM | POA: Diagnosis not present

## 2017-11-24 DIAGNOSIS — M86671 Other chronic osteomyelitis, right ankle and foot: Secondary | ICD-10-CM | POA: Diagnosis present

## 2017-11-24 DIAGNOSIS — Z8249 Family history of ischemic heart disease and other diseases of the circulatory system: Secondary | ICD-10-CM | POA: Diagnosis not present

## 2017-11-24 DIAGNOSIS — N183 Chronic kidney disease, stage 3 (moderate): Secondary | ICD-10-CM | POA: Diagnosis not present

## 2017-11-24 DIAGNOSIS — Z79899 Other long term (current) drug therapy: Secondary | ICD-10-CM | POA: Diagnosis not present

## 2017-11-24 DIAGNOSIS — R509 Fever, unspecified: Secondary | ICD-10-CM | POA: Diagnosis not present

## 2017-11-24 DIAGNOSIS — Z4822 Encounter for aftercare following kidney transplant: Secondary | ICD-10-CM | POA: Diagnosis not present

## 2017-11-24 DIAGNOSIS — I481 Persistent atrial fibrillation: Secondary | ICD-10-CM | POA: Diagnosis not present

## 2017-11-24 DIAGNOSIS — I130 Hypertensive heart and chronic kidney disease with heart failure and stage 1 through stage 4 chronic kidney disease, or unspecified chronic kidney disease: Secondary | ICD-10-CM | POA: Diagnosis not present

## 2017-11-24 DIAGNOSIS — R634 Abnormal weight loss: Secondary | ICD-10-CM | POA: Diagnosis not present

## 2017-11-24 DIAGNOSIS — Z888 Allergy status to other drugs, medicaments and biological substances status: Secondary | ICD-10-CM | POA: Diagnosis not present

## 2017-11-24 DIAGNOSIS — E872 Acidosis: Secondary | ICD-10-CM | POA: Diagnosis not present

## 2017-11-24 DIAGNOSIS — L97511 Non-pressure chronic ulcer of other part of right foot limited to breakdown of skin: Secondary | ICD-10-CM | POA: Diagnosis not present

## 2017-11-24 DIAGNOSIS — D72819 Decreased white blood cell count, unspecified: Secondary | ICD-10-CM | POA: Diagnosis present

## 2017-11-24 DIAGNOSIS — E785 Hyperlipidemia, unspecified: Secondary | ICD-10-CM | POA: Diagnosis present

## 2017-11-24 DIAGNOSIS — K529 Noninfective gastroenteritis and colitis, unspecified: Secondary | ICD-10-CM | POA: Diagnosis not present

## 2017-11-24 DIAGNOSIS — K8681 Exocrine pancreatic insufficiency: Secondary | ICD-10-CM | POA: Diagnosis present

## 2017-11-24 DIAGNOSIS — R9431 Abnormal electrocardiogram [ECG] [EKG]: Secondary | ICD-10-CM | POA: Diagnosis not present

## 2017-11-24 DIAGNOSIS — I482 Chronic atrial fibrillation: Secondary | ICD-10-CM | POA: Diagnosis present

## 2017-11-24 DIAGNOSIS — M79671 Pain in right foot: Secondary | ICD-10-CM | POA: Diagnosis not present

## 2017-11-24 DIAGNOSIS — K861 Other chronic pancreatitis: Secondary | ICD-10-CM | POA: Diagnosis present

## 2017-11-24 DIAGNOSIS — L89611 Pressure ulcer of right heel, stage 1: Secondary | ICD-10-CM | POA: Diagnosis present

## 2017-11-24 DIAGNOSIS — I129 Hypertensive chronic kidney disease with stage 1 through stage 4 chronic kidney disease, or unspecified chronic kidney disease: Secondary | ICD-10-CM | POA: Diagnosis not present

## 2017-11-24 DIAGNOSIS — D529 Folate deficiency anemia, unspecified: Secondary | ICD-10-CM | POA: Diagnosis not present

## 2017-11-24 DIAGNOSIS — I110 Hypertensive heart disease with heart failure: Secondary | ICD-10-CM | POA: Diagnosis present

## 2017-11-24 DIAGNOSIS — E1169 Type 2 diabetes mellitus with other specified complication: Secondary | ICD-10-CM | POA: Diagnosis present

## 2017-11-24 DIAGNOSIS — R531 Weakness: Secondary | ICD-10-CM | POA: Diagnosis not present

## 2017-11-24 DIAGNOSIS — I739 Peripheral vascular disease, unspecified: Secondary | ICD-10-CM | POA: Diagnosis not present

## 2017-11-24 DIAGNOSIS — G4730 Sleep apnea, unspecified: Secondary | ICD-10-CM | POA: Diagnosis present

## 2017-11-24 DIAGNOSIS — R109 Unspecified abdominal pain: Secondary | ICD-10-CM | POA: Diagnosis not present

## 2017-11-24 DIAGNOSIS — M86171 Other acute osteomyelitis, right ankle and foot: Secondary | ICD-10-CM | POA: Diagnosis not present

## 2017-11-24 DIAGNOSIS — Z940 Kidney transplant status: Secondary | ICD-10-CM | POA: Diagnosis not present

## 2017-11-24 DIAGNOSIS — M86271 Subacute osteomyelitis, right ankle and foot: Secondary | ICD-10-CM | POA: Diagnosis not present

## 2017-11-24 DIAGNOSIS — Z955 Presence of coronary angioplasty implant and graft: Secondary | ICD-10-CM | POA: Diagnosis not present

## 2017-11-24 DIAGNOSIS — N2581 Secondary hyperparathyroidism of renal origin: Secondary | ICD-10-CM | POA: Diagnosis not present

## 2017-11-24 DIAGNOSIS — D696 Thrombocytopenia, unspecified: Secondary | ICD-10-CM | POA: Diagnosis present

## 2017-11-24 NOTE — Telephone Encounter (Signed)
The wife called in for her husband.   She was just starting to tell me what was going on with her husband when Dr. Janett Billow Copland called on the other line per wife.   So she got off the phone with me and took the other call.

## 2017-11-24 NOTE — Telephone Encounter (Signed)
Copied from [place] [phone]. Topic: General - Other >> Nov 24, 2017 11:19 AM Judyann Munson wrote: Reason for CRM: [facility] to report the patient has elevated  temp 99.7 and pulse rate at rest was 93 to 142.  Please advise   Cb # [phone]

## 2017-11-24 NOTE — Telephone Encounter (Signed)
Called back and got VM for Matthew Rowland- left her a message with my cell number if she needs to call me back. Will call pt  Spoke with pt and his wife- he is not feeling well today, feels weak, having a hard time standing up.  He also states that his most recent weight at MD office was 130 lbs this week  He is now finished with his abx  With these sx and temp near 100, advised pt to go to the ER for evaluation . Also advised him that his recent liver tests were up quite a bit.  I had called his GI team at [facility] this am to relay this info- had to leave a message with a doctor who was cross covering as his usual team was not available.   They understand and will proceed to ER    Pulse Readings from Last 3 Encounters:  11/10/17 71  09/07/17 (!) 119  09/04/17 71   Wt Readings from Last 3 Encounters:  09/07/17 148 lb (67.1 kg)  09/03/17 142 lb (64.4 kg)  09/03/17 142 lb (64.4 kg)

## 2017-11-25 ENCOUNTER — Encounter: Payer: Self-pay | Admitting: Family Medicine

## 2017-11-25 DIAGNOSIS — M86271 Subacute osteomyelitis, right ankle and foot: Secondary | ICD-10-CM | POA: Diagnosis not present

## 2017-11-25 DIAGNOSIS — I509 Heart failure, unspecified: Secondary | ICD-10-CM | POA: Diagnosis not present

## 2017-11-25 DIAGNOSIS — B9620 Unspecified Escherichia coli [E. coli] as the cause of diseases classified elsewhere: Secondary | ICD-10-CM | POA: Diagnosis not present

## 2017-11-25 DIAGNOSIS — E1169 Type 2 diabetes mellitus with other specified complication: Secondary | ICD-10-CM | POA: Diagnosis not present

## 2017-11-25 DIAGNOSIS — Z4781 Encounter for orthopedic aftercare following surgical amputation: Secondary | ICD-10-CM | POA: Diagnosis not present

## 2017-11-25 DIAGNOSIS — I130 Hypertensive heart and chronic kidney disease with heart failure and stage 1 through stage 4 chronic kidney disease, or unspecified chronic kidney disease: Secondary | ICD-10-CM | POA: Diagnosis not present

## 2017-11-25 LAB — CHLORIDE: CHLORIDE: 111

## 2017-11-25 LAB — ESTIMATED GFR: GFR CALC NON AF AMER: 41

## 2017-11-25 LAB — CALCIUM: Calcium: 8.6

## 2017-11-25 LAB — CO2, TOTAL: CO2: 12

## 2017-11-25 LAB — PROTEIN, TOTAL: Protein: 5.6

## 2017-11-27 MED ORDER — TACROLIMUS 1 MG PO CAPS
2.00 | ORAL_CAPSULE | ORAL | Status: DC
Start: 2017-11-27 — End: 2017-11-27

## 2017-11-27 MED ORDER — VALGANCICLOVIR HCL 450 MG PO TABS
450.00 | ORAL_TABLET | ORAL | Status: DC
Start: 2017-11-28 — End: 2017-11-27

## 2017-11-27 MED ORDER — METOPROLOL SUCCINATE ER 50 MG PO TB24
150.00 | ORAL_TABLET | ORAL | Status: DC
Start: 2017-11-27 — End: 2017-11-27

## 2017-11-27 MED ORDER — INSULIN LISPRO 100 UNIT/ML ~~LOC~~ SOLN
1.00 | SUBCUTANEOUS | Status: DC
Start: 2017-11-27 — End: 2017-11-27

## 2017-11-27 MED ORDER — NEPHRO-VITE 0.8 MG PO TABS
1.00 | ORAL_TABLET | ORAL | Status: DC
Start: 2017-11-28 — End: 2017-11-27

## 2017-11-27 MED ORDER — VITAMIN B-12 1000 MCG PO TABS
1000.00 | ORAL_TABLET | ORAL | Status: DC
Start: 2017-11-28 — End: 2017-11-27

## 2017-11-27 MED ORDER — PAROXETINE HCL 10 MG PO TABS
10.00 | ORAL_TABLET | ORAL | Status: DC
Start: 2017-11-28 — End: 2017-11-27

## 2017-11-27 MED ORDER — PANCRELIPASE (LIP-PROT-AMYL) 24000-76000 UNITS PO CPEP
24000.00 | ORAL_CAPSULE | ORAL | Status: DC
Start: 2017-11-28 — End: 2017-11-27

## 2017-11-27 MED ORDER — GENERIC EXTERNAL MEDICATION
625.00 | Status: DC
Start: 2017-11-27 — End: 2017-11-27

## 2017-11-27 MED ORDER — ZINC SULFATE 220 (50 ZN) MG PO CAPS
220.00 | ORAL_CAPSULE | ORAL | Status: DC
Start: 2017-11-28 — End: 2017-11-27

## 2017-11-27 MED ORDER — FINASTERIDE 5 MG PO TABS
5.00 | ORAL_TABLET | ORAL | Status: DC
Start: 2017-11-27 — End: 2017-11-27

## 2017-11-27 MED ORDER — DEXTROSE 10 % IV SOLN
125.00 | INTRAVENOUS | Status: DC
Start: ? — End: 2017-11-27

## 2017-11-27 MED ORDER — HYDROCODONE-ACETAMINOPHEN 5-325 MG PO TABS
1.00 | ORAL_TABLET | ORAL | Status: DC
Start: ? — End: 2017-11-27

## 2017-11-27 MED ORDER — POLYSACCHARIDE IRON COMPLEX 150 MG PO CAPS
150.00 | ORAL_CAPSULE | ORAL | Status: DC
Start: 2017-11-28 — End: 2017-11-27

## 2017-11-27 MED ORDER — DIPHENOXYLATE-ATROPINE 2.5-0.025 MG PO TABS
2.00 | ORAL_TABLET | ORAL | Status: DC
Start: ? — End: 2017-11-27

## 2017-11-27 MED ORDER — GENERIC EXTERNAL MEDICATION
4.00 g | Status: DC
Start: 2017-11-28 — End: 2017-11-27

## 2017-11-27 MED ORDER — PANTOPRAZOLE SODIUM 40 MG PO TBEC
40.00 | DELAYED_RELEASE_TABLET | ORAL | Status: DC
Start: 2017-11-28 — End: 2017-11-27

## 2017-11-27 MED ORDER — GABAPENTIN 300 MG PO CAPS
300.00 | ORAL_CAPSULE | ORAL | Status: DC
Start: 2017-11-27 — End: 2017-11-27

## 2017-11-27 MED ORDER — SODIUM BICARBONATE 650 MG PO TABS
650.00 | ORAL_TABLET | ORAL | Status: DC
Start: 2017-11-27 — End: 2017-11-27

## 2017-11-27 MED ORDER — PREDNISONE 5 MG PO TABS
5.00 | ORAL_TABLET | ORAL | Status: DC
Start: 2017-11-28 — End: 2017-11-27

## 2017-11-29 DIAGNOSIS — D899 Disorder involving the immune mechanism, unspecified: Secondary | ICD-10-CM | POA: Diagnosis not present

## 2017-11-30 DIAGNOSIS — I82890 Acute embolism and thrombosis of other specified veins: Secondary | ICD-10-CM | POA: Diagnosis not present

## 2017-11-30 DIAGNOSIS — Z524 Kidney donor: Secondary | ICD-10-CM | POA: Diagnosis not present

## 2017-11-30 DIAGNOSIS — I864 Gastric varices: Secondary | ICD-10-CM | POA: Diagnosis not present

## 2017-11-30 DIAGNOSIS — E119 Type 2 diabetes mellitus without complications: Secondary | ICD-10-CM | POA: Diagnosis not present

## 2017-11-30 DIAGNOSIS — I2510 Atherosclerotic heart disease of native coronary artery without angina pectoris: Secondary | ICD-10-CM | POA: Diagnosis not present

## 2017-11-30 DIAGNOSIS — Z7952 Long term (current) use of systemic steroids: Secondary | ICD-10-CM | POA: Diagnosis not present

## 2017-11-30 DIAGNOSIS — K863 Pseudocyst of pancreas: Secondary | ICD-10-CM | POA: Diagnosis not present

## 2017-11-30 DIAGNOSIS — Z7901 Long term (current) use of anticoagulants: Secondary | ICD-10-CM | POA: Diagnosis not present

## 2017-11-30 DIAGNOSIS — Z9889 Other specified postprocedural states: Secondary | ICD-10-CM | POA: Diagnosis not present

## 2017-11-30 DIAGNOSIS — K861 Other chronic pancreatitis: Secondary | ICD-10-CM | POA: Diagnosis not present

## 2017-11-30 DIAGNOSIS — Z905 Acquired absence of kidney: Secondary | ICD-10-CM | POA: Diagnosis not present

## 2017-11-30 DIAGNOSIS — T50905A Adverse effect of unspecified drugs, medicaments and biological substances, initial encounter: Secondary | ICD-10-CM | POA: Diagnosis not present

## 2017-11-30 DIAGNOSIS — Z940 Kidney transplant status: Secondary | ICD-10-CM | POA: Diagnosis not present

## 2017-11-30 DIAGNOSIS — I4891 Unspecified atrial fibrillation: Secondary | ICD-10-CM | POA: Diagnosis not present

## 2017-11-30 DIAGNOSIS — Z791 Long term (current) use of non-steroidal anti-inflammatories (NSAID): Secondary | ICD-10-CM | POA: Diagnosis not present

## 2017-11-30 DIAGNOSIS — Z89431 Acquired absence of right foot: Secondary | ICD-10-CM | POA: Diagnosis not present

## 2017-11-30 DIAGNOSIS — Z79899 Other long term (current) drug therapy: Secondary | ICD-10-CM | POA: Diagnosis not present

## 2017-11-30 DIAGNOSIS — K8689 Other specified diseases of pancreas: Secondary | ICD-10-CM | POA: Diagnosis not present

## 2017-11-30 DIAGNOSIS — N189 Chronic kidney disease, unspecified: Secondary | ICD-10-CM | POA: Diagnosis not present

## 2017-12-01 DIAGNOSIS — D899 Disorder involving the immune mechanism, unspecified: Secondary | ICD-10-CM | POA: Diagnosis not present

## 2017-12-01 DIAGNOSIS — Z940 Kidney transplant status: Secondary | ICD-10-CM | POA: Diagnosis not present

## 2017-12-02 DIAGNOSIS — Z4789 Encounter for other orthopedic aftercare: Secondary | ICD-10-CM | POA: Diagnosis not present

## 2017-12-02 DIAGNOSIS — Z9889 Other specified postprocedural states: Secondary | ICD-10-CM | POA: Diagnosis not present

## 2017-12-02 DIAGNOSIS — M25571 Pain in right ankle and joints of right foot: Secondary | ICD-10-CM | POA: Diagnosis not present

## 2017-12-03 ENCOUNTER — Other Ambulatory Visit: Payer: Self-pay | Admitting: Family Medicine

## 2017-12-03 DIAGNOSIS — I493 Ventricular premature depolarization: Secondary | ICD-10-CM | POA: Diagnosis not present

## 2017-12-03 DIAGNOSIS — I4891 Unspecified atrial fibrillation: Secondary | ICD-10-CM | POA: Diagnosis not present

## 2017-12-03 DIAGNOSIS — R9431 Abnormal electrocardiogram [ECG] [EKG]: Secondary | ICD-10-CM | POA: Diagnosis not present

## 2017-12-03 DIAGNOSIS — I5022 Chronic systolic (congestive) heart failure: Secondary | ICD-10-CM | POA: Diagnosis not present

## 2017-12-03 DIAGNOSIS — F4320 Adjustment disorder, unspecified: Secondary | ICD-10-CM

## 2017-12-03 DIAGNOSIS — I482 Chronic atrial fibrillation: Secondary | ICD-10-CM | POA: Diagnosis not present

## 2017-12-03 DIAGNOSIS — Z955 Presence of coronary angioplasty implant and graft: Secondary | ICD-10-CM | POA: Diagnosis not present

## 2017-12-03 DIAGNOSIS — I340 Nonrheumatic mitral (valve) insufficiency: Secondary | ICD-10-CM | POA: Diagnosis not present

## 2017-12-03 DIAGNOSIS — R54 Age-related physical debility: Secondary | ICD-10-CM | POA: Diagnosis not present

## 2017-12-03 DIAGNOSIS — Z7409 Other reduced mobility: Secondary | ICD-10-CM | POA: Diagnosis not present

## 2017-12-06 DIAGNOSIS — I340 Nonrheumatic mitral (valve) insufficiency: Secondary | ICD-10-CM | POA: Diagnosis not present

## 2017-12-06 DIAGNOSIS — I4891 Unspecified atrial fibrillation: Secondary | ICD-10-CM | POA: Diagnosis not present

## 2017-12-06 DIAGNOSIS — I482 Chronic atrial fibrillation: Secondary | ICD-10-CM | POA: Diagnosis not present

## 2017-12-06 DIAGNOSIS — I5022 Chronic systolic (congestive) heart failure: Secondary | ICD-10-CM | POA: Diagnosis not present

## 2017-12-06 DIAGNOSIS — I509 Heart failure, unspecified: Secondary | ICD-10-CM | POA: Diagnosis not present

## 2017-12-07 ENCOUNTER — Ambulatory Visit (INDEPENDENT_AMBULATORY_CARE_PROVIDER_SITE_OTHER): Payer: Medicare Other | Admitting: Orthopedic Surgery

## 2017-12-07 NOTE — Progress Notes (Signed)
[facility] [address], [place], [place] [address] [phone]  Date:  12/08/2017   Name:  Matthew Rowland   DOB:  1946/04/16   MRN:  [id number]  PCP:  Darreld Mclean, MD    Chief Complaint: Foot Pain (4 week follow up, not really feeling any better); Medication Management (would like to know which medication he is to continue to take); Home Care Concern (trouble walking, no appetite, nausea, diarrhea, wife is unsure how she can continue to physically take care of him, lifting him); and skin lesion (skin lesion on back of scalp, increased in size, dark color, irregular shape, has had skin ca on head in past)   History of Present Illness:  Matthew Rowland is a 72 y.o. very pleasant male patient who presents with the following:  Matthew Rowland is here today for a follow-up visit- I have reviewed extensive recnet notes from various [facility] specialists as below.  He has had complex history recently- he has a fib and CHF/ cardiomyopathy, DM He then developed persistent pain in his right foot and underwent a partial ray amputation and treatment for possible osteomyelitis.  He continues to suffer with severe and persistent foot and ankle pain.  He was also then dx with chronic pancreatitis In addition his wife recently noted a skin lesion on the back of his head at the crown- she is really not sure how long it has been there as they have been dealing with other things.  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 In the ED he was afebrile and hemodynamically stable. Admission labs significant for WBC 3.1, Hgb of 13.2 g/dl, Creatinine of 1.77 mg/dl, Alkaline phosphatase of 930 U/L, mildly elevated transaminases and elevated inflammatory markers (ESR 26, CRP 83). XRay of the right foot showed erosion along the lateral aspect of the distal fourth metatarsal concerning for acute osteomyelitis. MRI was obtained and showed changes consistent with post-surgical changes and less consistent with acute osteomyelitis. Osteo/ortho/podiatry consult placed and they felt this picture was more consistent with chronic osteomyelitis. Given poor pulses on exam, they recommended ABI/TBI which demonstrated small vessel changes and adequate arterial flow as well as vascular surgery consult who felt he likely has sufficient blood flow for wound healing if further amputation required. He is scheduled for follow up in podiatry on 8/15 with Dr. Laverta Baltimore for definitive management and possible bone biopsy at that time. He did not receive antibiotics while inpatient.   He was also noted to have leukopenia and thrombocytopenia in the  setting of immunosuppression for his renal transplant. His immunosuppression regimen is managed by [facility], and per report his Belatecept infusions have recently been spaced to every 6 weeks from monthly. CMV, EBV, parvovirus,  JC, and BK viral titers were checked and were pending at time of discharge. He was empirically started on valganciclovir for empiric coverage of possible viral etiology of cytopenia. Despite pending titers at time of discharge and ongoing cytopenia, patient was insistent on discharge on 8/10. He and wife were provided with an explicit discussion about the risks of leaving, and he was insistent on discharge on 8/10. He was recommended to follow neutropenic precautions and to have a CBC with differential obtained on 8/12. He was asked to contact [facility] with this information to assist with managing his immunosuppressive regimen. Of note, on chart review after discharge his IgG Parvovirus was positive (IgM negative).   For chronic pancreatitis and chronic diarrhea in the setting of severe protein-calorie nutrition, the patient follows as an outpatient with [facility]. Dietary supplements were ordered per nutrition recommendations. CT of the abdomen and pelvis was obtained this admission (see report below), which showed a calcified pancreas with a calcified pseudocyst as well as a thickened gastric wall, for which endoscopy was recommended. He was continued on Creon per outpatient regimen. He was started on sodium bicarbonate tabs for metabolic acidosis, with the acidosis felt to be secondary to chronic diarrhea. He will follow-up with GI on 11/30/2017.   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  He was seen and evaluated today with Dr. Delrae Alfred. We reviewed his most recent CT scan with the patient. We also discussed the surgical management and strategies for treating acute pancreatitis, chronic pancreatitis and fluid collections. We discussed the STEP-UP approach and multi-modality management of pancreatitis including percutaneous drainage by Interventional Radiology, endoscopic drainage, transgastric or transduodenal, as well as surgical management whether open or laparoscopic. We discussed the less invasive approaches are preferred if technically feasible rather than the open technique given less morbidity. We discussed the acute phase of pancreatitis and recovery thereafter --The patient is currently asymptomatic, so we will monitor at this time and avoid drainage. If he develops any new weight loss, nausea/vomiting, poor appetite, early satiety, bloating then we can consider endoscopic drainage of the collection --Continue pancreatic enzymes with meals and Questran BID-TID --Discontinue Lomotil as this was not previously helpful --Request reports of the EGD/Colonoscopy 03/2017 to assist with future planning/ screenings --Also discussed current use of colchicine can cause diarrhea and caution with renal impairment. He demonstrated understanding We will see the patient in 6 months or sooner if new symptoms arise.   [facility] cardiology note 8/16:  IMPRESSION: 1. Chronic atrial fibrillation with occasional rapid ventricular response (asymptomatic) - PAF 2002-2010. - on apixaban (previously rivaroxaban) and metoprolol (150 mg 03/2016 and "250 mg divided into doses because suspected malabsorption 05/2016 with home heart rates 90-100s and home BPs almost always <130s").  - S/P ablations 2010 and 2013 in [place]. - "Failed dronedarone and had dyspnea on amiodarone. 2.  Dilated LV with "moderate to severe MR" (but in AF). - Per Dr. Tommi Rumps notes, "reviewed by Matthew Rowland at [facility] who  thought severe functional or secondary MR; Was only mild to moderate MR and LVEF 40-45% at [facility] in 2017." - Echo 04/19/2017 at [facility] system: LVEF 45% (improved from 12/17); LVIDs 4.3 cm and LVIDd 5.3 cm; severe LAE (for details, see my note). - "Dilated ascending aorta (3.9 cm) Atrial level shunt visualized by color flow Doppler." 3. S/P stent to 85% proximal LAD 2013 - Per Matthew Rowland:  "Nonischemic cardiomyopathy; Regadenoson/Myoview at [facility] 01/2016 no ischemia and no infarct." "NSTEMI 12/2015 at [facility]?" 4. S/P renal transplant 2014 at [facility] -on Prograf, 5FU, and tacrolimus. - eGFR 43, Crea 1.6 on 10/25/2017 (25 and 2.6 on 03/10/2017) - Also mildly elevated LFTs (AST 69 and ALT 54). 5. Excessive bruising related to Pancytopenia most likely due to immunosuppression." - Diagnosed 12/08/2015? - 10/2017: WBC 2.7, hemoglobin 9.6, platelets 92. 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 6. Recent unintentional weight loss ~15 lbs with malnutrition (Body mass index is 17.5 kg/m.)  PLAN 1. Given the severe MR, assess for chronic systolic heart failure with chest PA lateral. 2. Also ascertain a baseline BNP in case it is difficult to distinguish whether CHF is contributing to symptoms since he is medically complex 3. I requested home BP and heart rates to decide whether to add another rate control agent since his heart rate is slightly above 100. On the other hand, his blood pressure is already less than 120 mmHg 4. Once he gets stronger, we can consider TEE in the next 2 to 6 months to define severity of mitral valve regurgitation and whether MitraClip (COAPT) might help in the future I answered all of the patient's and wife questions and concerns. We will see each other again in approximately 4 weeks or so if it's convenient for them.   ADDENDUM: 12/06/17 BNP 820, but chest PA-L "No pulmonary edema. No active pulmonary disease." Therefore no need to change anything at this time.   Called and discussed with Mr. & Mrs. Mogan.   Pt has chronic diarrhea, GI started pancreatic enzymes but he is not really sure if this is helping  His wife is having a harder time getting him to the bathroom. He will have diarrhea with no warning  He is wearing depends but they don't always help His wife is a small person and cannot hold him up  He had diarrhea this am- even after just eating a very light meal.  Eating anything may trigger a bout of diarrhea, but it can also occur seeminly at random with no warning  He has not felt like eating much  He has not wanted to take ensure shakes any longer.  He is not able to stand on the scales very easily so we don't have a recent weight for him   They were getting [facility]; however since his admission these services have not been re-started  we need to set up PT, OT and nursing for bathing   The foot and ankle are about the same- still very painful He has been on colchicine for about 10 days and it has not seemed to help - we will have him stop this due to it not helping and possibly contributing to his diarrhea He is taking hydrocodone 5 mg/325 q 6 hours.  This helps some with his pain, but he is still  in too much pain to stand or walk.  He notes that by the time 6 hours are up and he is due for his next dose he may be miserable with pain  UDS done 7/19 Contract is UTD   [place]:  11/15/2017  1  11/10/2017  Hydrocodone-Acetamin 5-325 Mg  120.00 [place]  [id number]  [place] ([id number])  1/1 20.00 MME Medicare  [place]  11/07/2017  1  11/07/2017  Hydrocodone-Acetamin 5-325 Mg  20.00 7 Je Cop  [id number]  [place] ([id number])  1/1 14.29 MME Medicare  [place]  10/29/2017  1  10/22/2017  Diphenoxylate-Atrop 2.5-0.025  90.00 30 Ca Jon  [id number]  [place] ([id number])  1/1  Medicare  [place]  10/29/2017  1  10/22/2017  Hydrocodone-Acetamin 5-325 Mg  20.00 5 Ca Jon  [id number]  [place] ([id number])  1/1 20.00 MME Medicare  [place]  09/14/2017  1  09/14/2017  Hydrocodone-Acetamin 5-325 Mg   15.00 5 Je Cop  [id number]  [place] ([id number])  1/1 15.0     He did run out of his oral iron about 3 days ago   Lab Results  Component Value Date   HGBA1C 10.2 (H) 12/24/2016   His most recent A1c was 5.5 while in [place] in July- he is not taking glimepiride and we will take this off his med list  ferritin in normal range when last checked per [facility] in July  He is on paxil 10 mg but ran out just a couple of days ago, we are going to try and increase to 20 mg in hopes of improving his depression and pain.  Matthew Rowland is not suicidal but admits that his current situation is so miserable that he would not be upset if he died.   He is taking gabapentin 300 at bedtime per his podiatrist  He is on prevalite TID - I will refill this for them when needed   Matthew Rowland tried to pick the lesion off the back of his head but it started bleeding so she stopped,  She is not sure what it is    Patient Active Problem List   Diagnosis Date Noted  . Squamous cell carcinoma of skin of face 06/02/2017  . Elbow injury, right, subsequent encounter 05/20/2016  . Fracture of right scapular body 05/20/2016  . Chronic combined systolic and diastolic CHF (congestive heart failure) ([place]) 03/29/2016  . Cardiomyopathy ([place]) 03/23/2016  . 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 Essential hypertension 01/15/2016  . H/O acute pancreatitis 01/15/2016  . Immunosuppression ([place]) 01/15/2016  . Type 2 diabetes mellitus with circulatory disorder ([place]) 01/15/2016  . Hx of kidney transplant 01/04/2016  . Folate deficiency 12/08/2015  . Pancytopenia ([place]) 12/08/2015  . CKD (chronic kidney disease) 01/03/2015  . Atrial fibrillation, persistent ([place]) 11/19/2014  . SSS (sick sinus syndrome) ([place]) 02/09/2014  . CAD (coronary artery disease) 02/21/2013    Past Medical History:  Diagnosis Date  . A-fib ([place])   . CHF (congestive heart failure) ([place])   . Chronic diarrhea   . Diabetes mellitus without complication (HCC)     borderline  . History of ITP   . Hypertension   . Pancreatitis   . Renal disorder     Past Surgical History:  Procedure Laterality Date  . ABDOMINAL SURGERY    . CHOLECYSTECTOMY    . KIDNEY TRANSPLANT      Social History   Tobacco Use  . Smoking status: Never Smoker  . Smokeless tobacco: Never  Used  Substance Use Topics  . Alcohol use: No  . Drug use: No    No family history on file.  Allergies  Allergen Reactions  . Atorvastatin Itching    Incessant itching which stopped when he stopped the atorvastatin.  Marland Kitchen Lisinopril Other (See Comments)    Causes pancreatitis     Medication list has been reviewed and updated.  Current Outpatient Medications on File Prior to Visit  Medication Sig Dispense Refill  . ACCU-CHEK FASTCLIX LANCETS MISC CHECK GLUCOSE 2 TIMES DAILY. 102 each 2  . Apixaban (ELIQUIS PO) Take 5 mg by mouth 2 (two) times daily. Patient does not remember dose     . cholestyramine (QUESTRAN) 4 g packet Take 4 g by mouth 2 (two) times daily.    . diphenoxylate-atropine (LOMOTIL) 2.5-0.025 MG tablet TK 1 T PO TID PRF DH  2  . finasteride (PROSCAR) 5 MG tablet     . glucose blood (ACCU-CHEK GUIDE) test strip Check glucose 1-2 times daily 100 each 12  . HYDROcodone-acetaminophen (NORCO/VICODIN) 5-325 MG tablet Take 1/2 or 1 tablet every 6 hours as needed for pain 120 tablet 0  . ipratropium (ATROVENT) 0.03 % nasal spray Place 2 sprays into the nose 4 (four) times daily. Use as needed for nasal drainage 30 mL 6  . metoprolol succinate (TOPROL-XL) 100 MG 24 hr tablet Pt is taking 100 mg and 50 mg twice a day for total of 150 mg BID 1 tablet 0  . Pancrelipase, Lip-Prot-Amyl, (CREON PO) Take by mouth.    . pantoprazole (PROTONIX) 40 MG tablet Take 40 mg by mouth daily.    . polycarbophil (FIBERCON) 625 MG tablet Take 2 tablets (1,250 mg total) by mouth daily. 30 tablet 0  . prednisoLONE 5 MG TABS tablet Take by mouth.    . predniSONE (DELTASONE) 5 MG tablet Take 5  mg by mouth.    . tacrolimus (PROGRAF) 1 MG capsule Take 1 mg by mouth 2 (two) times daily.     No current facility-administered medications on file prior to visit.     Review of Systems:  As per HPI- otherwise negative.   Physical Examination: Vitals:   12/08/17 1101  BP: 108/70  Pulse: 72  Resp: 16  Temp: 98.3 F (36.8 C)   Vitals:   There is no height or weight on file to calculate BMI. Ideal Body Weight:    GEN: WDWN, NAD, Non-toxic, A & O x 3, very thin man sitting in WC, appears older than age  50: Atraumatic, Normocephalic. Neck supple. No masses, No LAD.  PEERL, oropharynx wnl On the crown of his head is a nickel sized skin lesion- it is scabbed over.  However after soaking with a damp gauze I am not able to pick off the scab; it seems instead to be a soft, friable skin lesion instead of a scab after all  Ears and Nose: No external deformity. CV: RRR, No M/G/R. No JVD. No thrill. No extra heart sounds. PULM: CTA B, no wheezes, crackles, rhonchi. No retractions. No resp. distress. No accessory muscle use. EXTR: No c/c/e NEURO did not test gait, seated in [facility] He continues to note significant tenderness of the lateral right ankle and less so the medial right ankle  PSYCH: Normally interactive. Conversant. Not depressed or anxious appearing.  Calm demeanor.    Assessment and Plan: Frail elderly  Adjustment disorder, unspecified type - Plan: PARoxetine (PAXIL) 20 MG tablet  Iron deficiency anemia, unspecified iron  deficiency anemia type - Plan: FERREX 150 150 MG capsule  Paroxysmal atrial fibrillation (HCC)  Acute right ankle pain - Plan: gabapentin (NEURONTIN) 300 MG capsule  Chronic pain of right ankle - Plan: HYDROcodone-acetaminophen (NORCO) 7.5-325 MG tablet  Chronic atrial fibrillation (HCC)  Chronic pancreatitis, unspecified pancreatitis type (HCC)  Chronic diarrhea  Weight loss, unintentional  Skin lesion of scalp  Following up today His main  concerns are lack of appetite, weight loss, diarrhea, and ankle pain Will d/c colchicine as not helping and may be contributing to diarrhea He also does not need glucose control, d/c glimepiride I called [facility] and got things started again for PT, OT and an aid to help with bathing, etc Go back on iron to maintain levels and perhaps help with diarrhea Increase paxil to 20 mg in hopes of improving depression and appetite Increase his hydrocodone to 7.5 q 6 hours prn Asked him to see me in 8 weeks They have a dermatologist who they will see asap about his scalp lesion Need to fax completed note to [facility] at [phone]  Signed Lamar Blinks, MD

## 2017-12-08 ENCOUNTER — Ambulatory Visit (INDEPENDENT_AMBULATORY_CARE_PROVIDER_SITE_OTHER): Payer: Medicare Other | Admitting: Family Medicine

## 2017-12-08 ENCOUNTER — Encounter: Payer: Self-pay | Admitting: Family Medicine

## 2017-12-08 VITALS — BP 108/70 | HR 72 | Temp 98.3°F | Resp 16

## 2017-12-08 DIAGNOSIS — I482 Chronic atrial fibrillation, unspecified: Secondary | ICD-10-CM

## 2017-12-08 DIAGNOSIS — L989 Disorder of the skin and subcutaneous tissue, unspecified: Secondary | ICD-10-CM

## 2017-12-08 DIAGNOSIS — D509 Iron deficiency anemia, unspecified: Secondary | ICD-10-CM | POA: Diagnosis not present

## 2017-12-08 DIAGNOSIS — R634 Abnormal weight loss: Secondary | ICD-10-CM

## 2017-12-08 DIAGNOSIS — K529 Noninfective gastroenteritis and colitis, unspecified: Secondary | ICD-10-CM | POA: Diagnosis not present

## 2017-12-08 DIAGNOSIS — G8929 Other chronic pain: Secondary | ICD-10-CM | POA: Diagnosis not present

## 2017-12-08 DIAGNOSIS — M25571 Pain in right ankle and joints of right foot: Secondary | ICD-10-CM | POA: Diagnosis not present

## 2017-12-08 DIAGNOSIS — R54 Age-related physical debility: Secondary | ICD-10-CM

## 2017-12-08 DIAGNOSIS — K861 Other chronic pancreatitis: Secondary | ICD-10-CM

## 2017-12-08 DIAGNOSIS — F4320 Adjustment disorder, unspecified: Secondary | ICD-10-CM | POA: Diagnosis not present

## 2017-12-08 DIAGNOSIS — I480 Paroxysmal atrial fibrillation: Secondary | ICD-10-CM | POA: Diagnosis not present

## 2017-12-08 MED ORDER — GABAPENTIN 300 MG PO CAPS: 300.0000 mg | ORAL_CAPSULE | Freq: Every day | ORAL | 3 refills | Status: AC

## 2017-12-08 MED ORDER — FERREX 150 150 MG PO CAPS: 150.0000 mg | ORAL_CAPSULE | Freq: Every day | ORAL | 3 refills | Status: AC

## 2017-12-08 MED ORDER — HYDROCODONE-ACETAMINOPHEN 7.5-325 MG PO TABS: 1.0000 | ORAL_TABLET | Freq: Four times a day (QID) | ORAL | 0 refills | Status: AC | PRN

## 2017-12-08 MED ORDER — PAROXETINE HCL 20 MG PO TABS: 20.0000 mg | ORAL_TABLET | Freq: Every day | ORAL | 3 refills | Status: AC

## 2017-12-08 NOTE — Patient Instructions (Addendum)
It was good to see you today but I am sorry that things have been so very hard.  I will call [facility] today and set up as many services as we can!  I am sorry to see you suffering and hope that the steps we took today will improve things for you a bit   Stop colchicine Stop glimepiride  Continue/ restart iron- this will maintain your iron levels and hopefully decrease diarrhea We will increase paxil/ paroxetine to 20 mg in hopes of improving your mood and appetite Increase hydrocodone dose to 7.5 mg every 6 hours as needed.  If you need less pain control you can take a 1/2 tablet  Please keep me posted, let's visit in 8 weeks.

## 2017-12-10 ENCOUNTER — Telehealth: Payer: Self-pay | Admitting: *Deleted

## 2017-12-10 NOTE — Telephone Encounter (Signed)
Received Physician Orders from [place]; forwarded to provider/SLS 08/23  Received Home Health Certification and Plan of Care; forwarded to provider/SLS 08/23

## 2017-12-10 NOTE — Progress Notes (Signed)
Note has been faxed.

## 2017-12-13 DIAGNOSIS — F4320 Adjustment disorder, unspecified: Secondary | ICD-10-CM | POA: Diagnosis not present

## 2017-12-13 DIAGNOSIS — C4492 Squamous cell carcinoma of skin, unspecified: Secondary | ICD-10-CM | POA: Diagnosis not present

## 2017-12-13 DIAGNOSIS — Z7952 Long term (current) use of systemic steroids: Secondary | ICD-10-CM | POA: Diagnosis not present

## 2017-12-13 DIAGNOSIS — E785 Hyperlipidemia, unspecified: Secondary | ICD-10-CM | POA: Diagnosis not present

## 2017-12-13 DIAGNOSIS — Z79891 Long term (current) use of opiate analgesic: Secondary | ICD-10-CM | POA: Diagnosis not present

## 2017-12-13 DIAGNOSIS — K529 Noninfective gastroenteritis and colitis, unspecified: Secondary | ICD-10-CM | POA: Diagnosis not present

## 2017-12-13 DIAGNOSIS — Z7901 Long term (current) use of anticoagulants: Secondary | ICD-10-CM | POA: Diagnosis not present

## 2017-12-13 DIAGNOSIS — E43 Unspecified severe protein-calorie malnutrition: Secondary | ICD-10-CM | POA: Diagnosis not present

## 2017-12-13 DIAGNOSIS — Z89411 Acquired absence of right great toe: Secondary | ICD-10-CM | POA: Diagnosis not present

## 2017-12-13 DIAGNOSIS — I428 Other cardiomyopathies: Secondary | ICD-10-CM | POA: Diagnosis not present

## 2017-12-13 DIAGNOSIS — I130 Hypertensive heart and chronic kidney disease with heart failure and stage 1 through stage 4 chronic kidney disease, or unspecified chronic kidney disease: Secondary | ICD-10-CM | POA: Diagnosis not present

## 2017-12-13 DIAGNOSIS — I2510 Atherosclerotic heart disease of native coronary artery without angina pectoris: Secondary | ICD-10-CM | POA: Diagnosis not present

## 2017-12-13 DIAGNOSIS — M109 Gout, unspecified: Secondary | ICD-10-CM | POA: Diagnosis not present

## 2017-12-13 DIAGNOSIS — K861 Other chronic pancreatitis: Secondary | ICD-10-CM | POA: Diagnosis not present

## 2017-12-13 DIAGNOSIS — H903 Sensorineural hearing loss, bilateral: Secondary | ICD-10-CM | POA: Diagnosis not present

## 2017-12-13 DIAGNOSIS — G8929 Other chronic pain: Secondary | ICD-10-CM | POA: Diagnosis not present

## 2017-12-13 DIAGNOSIS — I5042 Chronic combined systolic (congestive) and diastolic (congestive) heart failure: Secondary | ICD-10-CM | POA: Diagnosis not present

## 2017-12-13 DIAGNOSIS — I482 Chronic atrial fibrillation: Secondary | ICD-10-CM | POA: Diagnosis not present

## 2017-12-13 DIAGNOSIS — D509 Iron deficiency anemia, unspecified: Secondary | ICD-10-CM | POA: Diagnosis not present

## 2017-12-13 DIAGNOSIS — N3941 Urge incontinence: Secondary | ICD-10-CM | POA: Diagnosis not present

## 2017-12-13 DIAGNOSIS — E1151 Type 2 diabetes mellitus with diabetic peripheral angiopathy without gangrene: Secondary | ICD-10-CM | POA: Diagnosis not present

## 2017-12-13 DIAGNOSIS — D631 Anemia in chronic kidney disease: Secondary | ICD-10-CM | POA: Diagnosis not present

## 2017-12-13 DIAGNOSIS — M25571 Pain in right ankle and joints of right foot: Secondary | ICD-10-CM | POA: Diagnosis not present

## 2017-12-13 DIAGNOSIS — E1122 Type 2 diabetes mellitus with diabetic chronic kidney disease: Secondary | ICD-10-CM | POA: Diagnosis not present

## 2017-12-13 DIAGNOSIS — N183 Chronic kidney disease, stage 3 (moderate): Secondary | ICD-10-CM | POA: Diagnosis not present

## 2017-12-21 ENCOUNTER — Encounter (INDEPENDENT_AMBULATORY_CARE_PROVIDER_SITE_OTHER): Payer: Self-pay | Admitting: Orthopedic Surgery

## 2017-12-21 ENCOUNTER — Ambulatory Visit (INDEPENDENT_AMBULATORY_CARE_PROVIDER_SITE_OTHER): Payer: Medicare Other | Admitting: Orthopedic Surgery

## 2017-12-21 VITALS — Ht 75.0 in | Wt 148.0 lb

## 2017-12-21 DIAGNOSIS — M25071 Hemarthrosis, right ankle: Secondary | ICD-10-CM

## 2017-12-21 DIAGNOSIS — M1A09X0 Idiopathic chronic gout, multiple sites, without tophus (tophi): Secondary | ICD-10-CM | POA: Diagnosis not present

## 2017-12-21 NOTE — Progress Notes (Signed)
Office Visit Note   Patient: Matthew Rowland           Date of Birth: 1946/03/15           MRN: [id number] Visit Date: 12/21/2017              Requested by: Darreld Mclean, MD [address] [place], [place] [address] PCP: Darreld Mclean, MD  Chief Complaint  Patient presents with  . Right Ankle - Pain  . Right Foot - Pain      HPI: Patient is a 72 year old gentleman with a complex medical problem.  Patient has had swelling of the ankle with venous insufficiency he has had a history of gout.  Patient has had an MRI scan of the right ankle as well as an injection at [facility].  Patient's most recent uric acid was 5.9 he had no relief with the recent trial of colchicine.  Patient complains of pain and swelling in the right ankle.  Patient is on Eliquis for blood thinner he has been wearing medical compression stockings for his venous insufficiency.  Patient also has a history of psoriasis with the possibility of psoriatic arthritis.  Assessment & Plan: Visit Diagnoses:  1. Idiopathic chronic gout of multiple sites without tophus   2. Hemarthrosis, right ankle     Plan: The ankle was injected and the aspirate was sent for crystal examination there is no purulence no signs of infection.  Reevaluate in 2 weeks.  Patient may require arthroscopic debridement of the ankle.  Patient may have a bleed from pigmented villonodular synovitis or psoriatic arthritis in the ankle.   Follow-Up Instructions: Return in about 2 weeks (around 01/04/2018).   Ortho Exam  Patient is alert, oriented, no adenopathy, well-dressed, normal affect, normal respiratory effort. Examination patient has a good pulse.  He has dependent redness of the right ankle he is tender to palpation over the anterior medial joint line.  He has a very small venous stasis ulcer on the medial malleolus.  There is bloody drainage no purulence.  After informed consent the ankle was aspirated with a 18-gauge  needle he did have a hemarthrosis the ankle was injected with steroid.  Reevaluate in 2 weeks.  Patient's MRI scan was reviewed which shows no evidence of osteomyelitis and does show a large effusion  Imaging: No results found. No images are attached to the encounter.  Labs: Lab Results  Component Value Date   HGBA1C 10.2 (H) 12/24/2016   ESRSEDRATE 11 09/03/2017   CRP 1.2 (H) 09/03/2017   LABURIC 5.9 09/20/2017   LABURIC 6.6 08/12/2017     Lab Results  Component Value Date   ALBUMIN 3.7 08/18/2017   ALBUMIN 3.7 08/12/2017   LABURIC 5.9 09/20/2017   LABURIC 6.6 08/12/2017    Body mass index is 18.5 kg/m.  Orders:  Orders Placed This Encounter  Procedures  . Medium Joint Inj  . Cell count + diff,  w/ cryst-synvl fld   No orders of the defined types were placed in this encounter.    Procedures: Medium Joint Inj: R ankle on 12/21/2017 3:25 PM Indications: pain and diagnostic evaluation Details: 22 G 1.5 in needle, anteromedial approach Medications: 2 mL lidocaine 1 %; 40 mg methylPREDNISolone acetate 40 MG/ML; 1 mL lidocaine 1 % Aspirate: 5 mL bloody Outcome: tolerated well, no immediate complications Procedure, treatment alternatives, risks and benefits explained, specific risks discussed. Consent was given by the patient. Immediately prior to  procedure a time out was called to verify the correct patient, procedure, equipment, support staff and site/side marked as required. Patient was prepped and draped in the usual sterile fashion.      Clinical Data: No additional findings.  ROS:  All other systems negative, except as noted in the HPI. Review of Systems  Objective: Vital Signs: Ht 6\' 3"  (1.905 m)   Wt 148 lb (67.1 kg)   BMI 18.50 kg/m   Specialty Comments:  No specialty comments available.  PMFS History: Patient Active Problem List   Diagnosis Date Noted  . Squamous cell carcinoma of skin of face 06/02/2017  . Elbow injury, right, subsequent  encounter 05/20/2016  . Fracture of right scapular body 05/20/2016  . Chronic combined systolic and diastolic CHF (congestive heart failure) ([place]) 03/29/2016  . Cardiomyopathy ([place]) 03/23/2016  . 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 Essential hypertension 01/15/2016  . H/O acute pancreatitis 01/15/2016  . Immunosuppression ([place]) 01/15/2016  . Type 2 diabetes mellitus with circulatory disorder ([place]) 01/15/2016  . Hx of kidney transplant 01/04/2016  . Folate deficiency 12/08/2015  . Pancytopenia ([place]) 12/08/2015  . CKD (chronic kidney disease) 01/03/2015  . Atrial fibrillation, persistent ([place]) 11/19/2014  . SSS (sick sinus syndrome) ([place]) 02/09/2014  . CAD (coronary artery disease) 02/21/2013   Past Medical History:  Diagnosis Date  . A-fib ([place])   . CHF (congestive heart failure) ([place])   . Chronic diarrhea   . Diabetes mellitus without complication (HCC)    borderline  . History of ITP   . Hypertension   . Pancreatitis   . Renal disorder     History reviewed. No pertinent family history.  Past Surgical History:  Procedure Laterality Date  . ABDOMINAL SURGERY    . CHOLECYSTECTOMY    . KIDNEY TRANSPLANT     Social History   Occupational History  . Not on file  Tobacco Use  . Smoking status: Never Smoker  . Smokeless tobacco: Never Used  Substance and Sexual Activity  . Alcohol use: No  . Drug use: No  . Sexual activity: Not on file

## 2017-12-21 NOTE — Progress Notes (Deleted)
   Office Visit Note   Patient: Matthew Rowland           Date of Birth: 1945-09-21           MRN: [id number] Visit Date: 12/21/2017              Requested by: Darreld Mclean, MD [address] [place], [place] [address] PCP: Darreld Mclean, MD  Chief Complaint  Patient presents with  . Right Ankle - Pain  . Right Foot - Pain      HPI: ***  Assessment & Plan: Visit Diagnoses: No diagnosis found.  Plan: ***  Follow-Up Instructions: No follow-ups on file.   Ortho Exam  Patient is alert, oriented, no adenopathy, well-dressed, normal affect, normal respiratory effort.   Imaging: No results found. No images are attached to the encounter.  Labs: Lab Results  Component Value Date   HGBA1C 10.2 (H) 12/24/2016   ESRSEDRATE 11 09/03/2017   CRP 1.2 (H) 09/03/2017   LABURIC 5.9 09/20/2017   LABURIC 6.6 08/12/2017     Lab Results  Component Value Date   ALBUMIN 3.7 08/18/2017   ALBUMIN 3.7 08/12/2017   LABURIC 5.9 09/20/2017   LABURIC 6.6 08/12/2017    Body mass index is 18.5 kg/m.  Orders:  No orders of the defined types were placed in this encounter.  No orders of the defined types were placed in this encounter.    Procedures: No procedures performed  Clinical Data: No additional findings.  ROS:  All other systems negative, except as noted in the HPI. Review of Systems  Objective: Vital Signs: Ht 6\' 3"  (1.905 m)   Wt 148 lb (67.1 kg)   BMI 18.50 kg/m   Specialty Comments:  No specialty comments available.  PMFS History: Patient Active Problem List   Diagnosis Date Noted  . Squamous cell carcinoma of skin of face 06/02/2017  . Elbow injury, right, subsequent encounter 05/20/2016  . Fracture of right scapular body 05/20/2016  . Chronic combined systolic and diastolic CHF (congestive heart failure) ([place]) 03/29/2016  . Cardiomyopathy ([place]) 03/23/2016  . 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 Essential hypertension 01/15/2016  . H/O acute pancreatitis 01/15/2016  . Immunosuppression ([place]) 01/15/2016  . Type 2 diabetes mellitus with circulatory disorder ([place]) 01/15/2016  . Hx of kidney transplant 01/04/2016  . Folate deficiency 12/08/2015  . Pancytopenia ([place]) 12/08/2015  . CKD (chronic kidney disease) 01/03/2015  . Atrial fibrillation, persistent ([place]) 11/19/2014  . SSS (sick sinus syndrome) ([place]) 02/09/2014  . CAD (coronary artery disease) 02/21/2013   Past Medical History:  Diagnosis Date  . A-fib ([place])   . CHF (congestive heart failure) ([place])   . Chronic diarrhea   . Diabetes mellitus without complication (HCC)    borderline  . History of ITP   . Hypertension   . Pancreatitis   . Renal disorder     No family history on file.  Past Surgical History:  Procedure Laterality Date  . ABDOMINAL SURGERY    . CHOLECYSTECTOMY    . KIDNEY TRANSPLANT     Social History   Occupational History  . Not on file  Tobacco Use  . Smoking status: Never Smoker  . Smokeless tobacco: Never Used  Substance and Sexual Activity  . Alcohol use: No  . Drug use: No  . Sexual activity: Not on file

## 2017-12-22 ENCOUNTER — Encounter (INDEPENDENT_AMBULATORY_CARE_PROVIDER_SITE_OTHER): Payer: Self-pay | Admitting: Family

## 2017-12-22 ENCOUNTER — Ambulatory Visit (INDEPENDENT_AMBULATORY_CARE_PROVIDER_SITE_OTHER): Payer: Medicare Other | Admitting: Family

## 2017-12-22 DIAGNOSIS — E1122 Type 2 diabetes mellitus with diabetic chronic kidney disease: Secondary | ICD-10-CM | POA: Diagnosis not present

## 2017-12-22 DIAGNOSIS — D689 Coagulation defect, unspecified: Secondary | ICD-10-CM

## 2017-12-22 DIAGNOSIS — L97311 Non-pressure chronic ulcer of right ankle limited to breakdown of skin: Secondary | ICD-10-CM

## 2017-12-22 DIAGNOSIS — T457X1A Poisoning by anticoagulant antagonists, vitamin K and other coagulants, accidental (unintentional), initial encounter: Secondary | ICD-10-CM

## 2017-12-22 DIAGNOSIS — I130 Hypertensive heart and chronic kidney disease with heart failure and stage 1 through stage 4 chronic kidney disease, or unspecified chronic kidney disease: Secondary | ICD-10-CM | POA: Diagnosis not present

## 2017-12-22 DIAGNOSIS — I5042 Chronic combined systolic (congestive) and diastolic (congestive) heart failure: Secondary | ICD-10-CM | POA: Diagnosis not present

## 2017-12-22 DIAGNOSIS — N183 Chronic kidney disease, stage 3 (moderate): Secondary | ICD-10-CM | POA: Diagnosis not present

## 2017-12-22 DIAGNOSIS — D631 Anemia in chronic kidney disease: Secondary | ICD-10-CM | POA: Diagnosis not present

## 2017-12-22 DIAGNOSIS — I482 Chronic atrial fibrillation: Secondary | ICD-10-CM | POA: Diagnosis not present

## 2017-12-22 LAB — SYNOVIAL CELL COUNT + DIFF, W/ CRYSTALS
BASOPHILS, %: 0 %
Eosinophils-Synovial: 0 % (ref 0–2)
Lymphocytes-Synovial Fld: 8 % (ref 0–74)
Monocyte/Macrophage: 0 % (ref 0–69)
NEUTROPHIL, SYNOVIAL: 92 % — AB (ref 0–24)
Synoviocytes, %: 0 % (ref 0–15)
WBC, SYNOVIAL: 5405 {cells}/uL — AB (ref <150)

## 2017-12-22 NOTE — Progress Notes (Signed)
Office Visit Note   Patient: Matthew Rowland           Date of Birth: 1945-11-20           MRN: [id number] Visit Date: 12/22/2017              Requested by: Darreld Mclean, MD [address] [place], [place] [address] PCP: Darreld Mclean, MD  No chief complaint on file.     HPI: Patient is a 72 year old gentleman with a complex medical problems.  Patient has had swelling of the ankle with venous insufficiency he has had a history of gout.    Seen and evaluated yesterday for ankle pain. Did receive an aspiration and injection of the left ankle. Also has a medial mall ankle ulcer which was debrided in office.   Patient is on Eliquis for blood thinner he has been wearing medical compression stockings for his venous insufficiency.  Today presents for concern of bleeding from lateral ankle ulcer. Was walking to shower with out dressings and noticed was standing in a "pool of blood with clots."  Assessment & Plan: Visit Diagnoses:  1. Anticoagulant-induced bleeding ([place])   2. Non-pressure chronic ulcer of right ankle limited to breakdown of skin ([place])     Plan: reassurance provided. No active bleeding today. Will leave dressing in place for 2 days. Will continue compression garments which were applied today in office. Discussed reinforcing dressings and wound care. Will follow up in office as scheduled. May call or return with any concerns.   Follow-Up Instructions: Return if symptoms worsen or fail to improve.   Ortho Exam  Patient is alert, oriented, no adenopathy, well-dressed, normal affect, normal respiratory effort. Examination patient has a good pulse.  He has a venous stasis ulcer on the medial malleolus 15 mm in diameter, granulation in wound bed, slight bleeding.  There is bloody drainage no purulence.  The injection portal clean with one drop of blood. Imaging: No results found. No images are attached to the encounter.  Labs: Lab Results    Component Value Date   HGBA1C 10.2 (H) 12/24/2016   ESRSEDRATE 11 09/03/2017   CRP 1.2 (H) 09/03/2017   LABURIC 5.9 09/20/2017   LABURIC 6.6 08/12/2017     Lab Results  Component Value Date   ALBUMIN 3.7 08/18/2017   ALBUMIN 3.7 08/12/2017   LABURIC 5.9 09/20/2017   LABURIC 6.6 08/12/2017    There is no height or weight on file to calculate BMI.  Orders:  No orders of the defined types were placed in this encounter.  No orders of the defined types were placed in this encounter.    Procedures: No procedures performed  Clinical Data: No additional findings.  ROS:  All other systems negative, except as noted in the HPI. Review of Systems  Constitutional: Negative for chills and fever.  Cardiovascular: Negative for leg swelling.  Skin: Positive for wound.    Objective: Vital Signs: There were no vitals taken for this visit.  Specialty Comments:  No specialty comments available.  PMFS History: Patient Active Problem List   Diagnosis Date Noted  . Squamous cell carcinoma of skin of face 06/02/2017  . Elbow injury, right, subsequent encounter 05/20/2016  . Fracture of right scapular body 05/20/2016  . Chronic combined systolic and diastolic CHF (congestive heart failure) ([place]) 03/29/2016  . Cardiomyopathy ([place]) 03/23/2016  . 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 Essential hypertension 01/15/2016  .  H/O acute pancreatitis 01/15/2016  . Immunosuppression ([place]) 01/15/2016  . Type 2 diabetes mellitus with circulatory disorder ([place]) 01/15/2016  . Hx of kidney transplant 01/04/2016  . Folate deficiency 12/08/2015  . Pancytopenia ([place]) 12/08/2015  . CKD (chronic kidney disease) 01/03/2015  . Atrial fibrillation, persistent ([place]) 11/19/2014  . SSS (sick sinus syndrome) ([place]) 02/09/2014  . CAD (coronary artery disease) 02/21/2013   Past Medical History:  Diagnosis Date  . A-fib ([place])   . CHF (congestive heart failure) ([place])   .  Chronic diarrhea   . Diabetes mellitus without complication (HCC)    borderline  . History of ITP   . Hypertension   . Pancreatitis   . Renal disorder     History reviewed. No pertinent family history.  Past Surgical History:  Procedure Laterality Date  . ABDOMINAL SURGERY    . CHOLECYSTECTOMY    . KIDNEY TRANSPLANT     Social History   Occupational History  . Not on file  Tobacco Use  . Smoking status: Never Smoker  . Smokeless tobacco: Never Used  Substance and Sexual Activity  . Alcohol use: No  . Drug use: No  . Sexual activity: Not on file

## 2017-12-23 DIAGNOSIS — N183 Chronic kidney disease, stage 3 (moderate): Secondary | ICD-10-CM | POA: Diagnosis not present

## 2017-12-23 DIAGNOSIS — E1122 Type 2 diabetes mellitus with diabetic chronic kidney disease: Secondary | ICD-10-CM | POA: Diagnosis not present

## 2017-12-23 DIAGNOSIS — I482 Chronic atrial fibrillation: Secondary | ICD-10-CM | POA: Diagnosis not present

## 2017-12-23 DIAGNOSIS — D631 Anemia in chronic kidney disease: Secondary | ICD-10-CM | POA: Diagnosis not present

## 2017-12-23 DIAGNOSIS — I130 Hypertensive heart and chronic kidney disease with heart failure and stage 1 through stage 4 chronic kidney disease, or unspecified chronic kidney disease: Secondary | ICD-10-CM | POA: Diagnosis not present

## 2017-12-23 DIAGNOSIS — I5042 Chronic combined systolic (congestive) and diastolic (congestive) heart failure: Secondary | ICD-10-CM | POA: Diagnosis not present

## 2017-12-24 DIAGNOSIS — I482 Chronic atrial fibrillation: Secondary | ICD-10-CM | POA: Diagnosis not present

## 2017-12-24 DIAGNOSIS — I130 Hypertensive heart and chronic kidney disease with heart failure and stage 1 through stage 4 chronic kidney disease, or unspecified chronic kidney disease: Secondary | ICD-10-CM | POA: Diagnosis not present

## 2017-12-24 DIAGNOSIS — D631 Anemia in chronic kidney disease: Secondary | ICD-10-CM | POA: Diagnosis not present

## 2017-12-24 DIAGNOSIS — N183 Chronic kidney disease, stage 3 (moderate): Secondary | ICD-10-CM | POA: Diagnosis not present

## 2017-12-24 DIAGNOSIS — I5042 Chronic combined systolic (congestive) and diastolic (congestive) heart failure: Secondary | ICD-10-CM | POA: Diagnosis not present

## 2017-12-24 DIAGNOSIS — E1122 Type 2 diabetes mellitus with diabetic chronic kidney disease: Secondary | ICD-10-CM | POA: Diagnosis not present

## 2017-12-27 ENCOUNTER — Telehealth: Payer: Self-pay

## 2017-12-27 DIAGNOSIS — I130 Hypertensive heart and chronic kidney disease with heart failure and stage 1 through stage 4 chronic kidney disease, or unspecified chronic kidney disease: Secondary | ICD-10-CM | POA: Diagnosis not present

## 2017-12-27 DIAGNOSIS — I5042 Chronic combined systolic (congestive) and diastolic (congestive) heart failure: Secondary | ICD-10-CM | POA: Diagnosis not present

## 2017-12-27 DIAGNOSIS — N183 Chronic kidney disease, stage 3 (moderate): Secondary | ICD-10-CM | POA: Diagnosis not present

## 2017-12-27 DIAGNOSIS — D631 Anemia in chronic kidney disease: Secondary | ICD-10-CM | POA: Diagnosis not present

## 2017-12-27 DIAGNOSIS — I482 Chronic atrial fibrillation: Secondary | ICD-10-CM | POA: Diagnosis not present

## 2017-12-27 DIAGNOSIS — E1122 Type 2 diabetes mellitus with diabetic chronic kidney disease: Secondary | ICD-10-CM | POA: Diagnosis not present

## 2017-12-27 NOTE — Telephone Encounter (Signed)
Verbal orders given  

## 2017-12-27 NOTE — Telephone Encounter (Signed)
Copied from [phone]. Topic: Quick Communication - See Telephone Encounter >> Dec 24, 2017 11:21 AM Antonieta Iba C wrote: CRM for notification. See Telephone encounter for: 12/24/17.  Called in to get the Okay to work with pt   Frequency: 2 x a week for 4 weeks - strength training and balance CB: [phone] [facility] w/ [facility] >> Dec 27, 2017  1:03 PM Berneta Levins wrote: [facility] calling to check on the status of these verbal orders

## 2017-12-29 DIAGNOSIS — I130 Hypertensive heart and chronic kidney disease with heart failure and stage 1 through stage 4 chronic kidney disease, or unspecified chronic kidney disease: Secondary | ICD-10-CM | POA: Diagnosis not present

## 2017-12-29 DIAGNOSIS — N183 Chronic kidney disease, stage 3 (moderate): Secondary | ICD-10-CM | POA: Diagnosis not present

## 2017-12-29 DIAGNOSIS — I5042 Chronic combined systolic (congestive) and diastolic (congestive) heart failure: Secondary | ICD-10-CM | POA: Diagnosis not present

## 2017-12-29 DIAGNOSIS — I482 Chronic atrial fibrillation: Secondary | ICD-10-CM | POA: Diagnosis not present

## 2017-12-29 DIAGNOSIS — D631 Anemia in chronic kidney disease: Secondary | ICD-10-CM | POA: Diagnosis not present

## 2017-12-29 DIAGNOSIS — E1122 Type 2 diabetes mellitus with diabetic chronic kidney disease: Secondary | ICD-10-CM | POA: Diagnosis not present

## 2017-12-30 DIAGNOSIS — E1122 Type 2 diabetes mellitus with diabetic chronic kidney disease: Secondary | ICD-10-CM | POA: Diagnosis not present

## 2017-12-30 DIAGNOSIS — I130 Hypertensive heart and chronic kidney disease with heart failure and stage 1 through stage 4 chronic kidney disease, or unspecified chronic kidney disease: Secondary | ICD-10-CM | POA: Diagnosis not present

## 2017-12-30 DIAGNOSIS — I482 Chronic atrial fibrillation: Secondary | ICD-10-CM | POA: Diagnosis not present

## 2017-12-30 DIAGNOSIS — I5042 Chronic combined systolic (congestive) and diastolic (congestive) heart failure: Secondary | ICD-10-CM | POA: Diagnosis not present

## 2017-12-30 DIAGNOSIS — D631 Anemia in chronic kidney disease: Secondary | ICD-10-CM | POA: Diagnosis not present

## 2017-12-30 DIAGNOSIS — N183 Chronic kidney disease, stage 3 (moderate): Secondary | ICD-10-CM | POA: Diagnosis not present

## 2017-12-30 MED ORDER — LIDOCAINE HCL 1 % IJ SOLN
1.0000 mL | INTRAMUSCULAR | Status: AC | PRN
  Administered 2017-12-21: 1 mL

## 2017-12-30 MED ORDER — METHYLPREDNISOLONE ACETATE 40 MG/ML IJ SUSP
40.0000 mg | INTRAMUSCULAR | Status: AC | PRN
  Administered 2017-12-21: 40 mg via INTRA_ARTICULAR

## 2017-12-30 MED ORDER — LIDOCAINE HCL 1 % IJ SOLN
2.0000 mL | INTRAMUSCULAR | Status: AC | PRN
  Administered 2017-12-21: 2 mL

## 2017-12-31 DIAGNOSIS — E1122 Type 2 diabetes mellitus with diabetic chronic kidney disease: Secondary | ICD-10-CM | POA: Diagnosis not present

## 2017-12-31 DIAGNOSIS — I130 Hypertensive heart and chronic kidney disease with heart failure and stage 1 through stage 4 chronic kidney disease, or unspecified chronic kidney disease: Secondary | ICD-10-CM | POA: Diagnosis not present

## 2017-12-31 DIAGNOSIS — D631 Anemia in chronic kidney disease: Secondary | ICD-10-CM | POA: Diagnosis not present

## 2017-12-31 DIAGNOSIS — I5042 Chronic combined systolic (congestive) and diastolic (congestive) heart failure: Secondary | ICD-10-CM | POA: Diagnosis not present

## 2017-12-31 DIAGNOSIS — N183 Chronic kidney disease, stage 3 (moderate): Secondary | ICD-10-CM | POA: Diagnosis not present

## 2017-12-31 DIAGNOSIS — I482 Chronic atrial fibrillation: Secondary | ICD-10-CM | POA: Diagnosis not present

## 2018-01-03 DIAGNOSIS — N183 Chronic kidney disease, stage 3 (moderate): Secondary | ICD-10-CM | POA: Diagnosis not present

## 2018-01-03 DIAGNOSIS — I482 Chronic atrial fibrillation: Secondary | ICD-10-CM | POA: Diagnosis not present

## 2018-01-03 DIAGNOSIS — E1122 Type 2 diabetes mellitus with diabetic chronic kidney disease: Secondary | ICD-10-CM | POA: Diagnosis not present

## 2018-01-03 DIAGNOSIS — I130 Hypertensive heart and chronic kidney disease with heart failure and stage 1 through stage 4 chronic kidney disease, or unspecified chronic kidney disease: Secondary | ICD-10-CM | POA: Diagnosis not present

## 2018-01-03 DIAGNOSIS — I5042 Chronic combined systolic (congestive) and diastolic (congestive) heart failure: Secondary | ICD-10-CM | POA: Diagnosis not present

## 2018-01-03 DIAGNOSIS — D631 Anemia in chronic kidney disease: Secondary | ICD-10-CM | POA: Diagnosis not present

## 2018-01-04 ENCOUNTER — Encounter (INDEPENDENT_AMBULATORY_CARE_PROVIDER_SITE_OTHER): Payer: Self-pay | Admitting: Physician Assistant

## 2018-01-04 ENCOUNTER — Encounter (HOSPITAL_COMMUNITY): Payer: Self-pay | Admitting: *Deleted

## 2018-01-04 ENCOUNTER — Ambulatory Visit (INDEPENDENT_AMBULATORY_CARE_PROVIDER_SITE_OTHER): Payer: Self-pay | Admitting: Physician Assistant

## 2018-01-04 ENCOUNTER — Other Ambulatory Visit: Payer: Self-pay

## 2018-01-04 ENCOUNTER — Ambulatory Visit (INDEPENDENT_AMBULATORY_CARE_PROVIDER_SITE_OTHER): Payer: Medicare Other | Admitting: Physician Assistant

## 2018-01-04 DIAGNOSIS — M25471 Effusion, right ankle: Secondary | ICD-10-CM

## 2018-01-04 NOTE — Progress Notes (Signed)
Office Visit Note   Patient: Matthew Rowland           Date of Birth: September 29, 1945           MRN: [id number] Visit Date: 01/04/2018              Requested by: Darreld Mclean, MD [address] [place], [place] [address] PCP: Darreld Mclean, MD  No chief complaint on file.     HPI: Patient is a 72 year old gentleman who presents in follow-up for pain and swelling right ankle.  Initial aspirate was negative for gout.  Patient states he has had bone biopsies in the past that were negative for osteomyelitis.  Patient states his pain has gotten worse and pain with weightbearing.  He is currently taking Vicodin for pain.  Assessment & Plan: Visit Diagnoses:  1. Effusion of right ankle     Plan: Discussed with patient with his elevated neutrophil count at 92% slightly elevated white cell count of 5000 with pain and swelling we need to rule out a septic joint.  Will proceed with arthroscopic intervention tomorrow we will obtain cultures prior to antibiotics.  We will plan to start him on doxycycline postoperatively and recommended the importance of probiotics and protein supplements such as whey powder.  Follow-Up Instructions: Return in about 2 weeks (around 01/18/2018).   Ortho Exam  Patient is alert, oriented, no adenopathy, well-dressed, normal affect, normal respiratory effort. Examination patient has an antalgic gait he does have dermatitis and pitting edema in the right leg the redness and swelling around the ankle almost appears more cellulitic than dermatitis.  There is different color than his leg which is more brawny edema and more redness and warmth to the ankle.  His fluid evaluation showed a red color that was bloody white cell count was 5000 neutrophil percentage was 92% there were no crystals seen.  Imaging: No results found. No images are attached to the encounter.  Labs: Lab Results  Component Value Date   HGBA1C 10.2 (H) 12/24/2016   ESRSEDRATE 11  09/03/2017   CRP 1.2 (H) 09/03/2017   LABURIC 5.9 09/20/2017   LABURIC 6.6 08/12/2017     Lab Results  Component Value Date   ALBUMIN 3.7 08/18/2017   ALBUMIN 3.7 08/12/2017   LABURIC 5.9 09/20/2017   LABURIC 6.6 08/12/2017    There is no height or weight on file to calculate BMI.  Orders:  No orders of the defined types were placed in this encounter.  No orders of the defined types were placed in this encounter.    Procedures: No procedures performed  Clinical Data: No additional findings.  ROS:  All other systems negative, except as noted in the HPI. Review of Systems  Objective: Vital Signs: There were no vitals taken for this visit.  Specialty Comments:  No specialty comments available.  PMFS History: Patient Active Problem List   Diagnosis Date Noted  . Squamous cell carcinoma of skin of face 06/02/2017  . Elbow injury, right, subsequent encounter 05/20/2016  . Fracture of right scapular body 05/20/2016  . Chronic combined systolic and diastolic CHF (congestive heart failure) ([place]) 03/29/2016  . Cardiomyopathy ([place]) 03/23/2016  . 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 Essential hypertension 01/15/2016  . H/O acute pancreatitis 01/15/2016  . Immunosuppression ([place]) 01/15/2016  . Type 2 diabetes mellitus with circulatory disorder ([place]) 01/15/2016  . Hx of kidney transplant 01/04/2016  . Folate deficiency  12/08/2015  . Pancytopenia ([place]) 12/08/2015  . CKD (chronic kidney disease) 01/03/2015  . Atrial fibrillation, persistent ([place]) 11/19/2014  . SSS (sick sinus syndrome) ([place]) 02/09/2014  . CAD (coronary artery disease) 02/21/2013   Past Medical History:  Diagnosis Date  . A-fib ([place])   . CHF (congestive heart failure) ([place])   . Chronic diarrhea   . Diabetes mellitus without complication (HCC)    borderline  . History of ITP   . Hypertension   . Pancreatitis   . Renal disorder     History reviewed. No pertinent  family history.  Past Surgical History:  Procedure Laterality Date  . ABDOMINAL SURGERY    . CHOLECYSTECTOMY    . KIDNEY TRANSPLANT     Social History   Occupational History  . Not on file  Tobacco Use  . Smoking status: Never Smoker  . Smokeless tobacco: Never Used  Substance and Sexual Activity  . Alcohol use: No  . Drug use: No  . Sexual activity: Not on file

## 2018-01-04 NOTE — Progress Notes (Signed)
Spoke with pt and his wife, Matthew Rowland for pre-op call. Pt has extensive heart hx with A-fib, severe Mitral regurgitation (Echo is in Care Everywhere) and CAD with stents. He sees Dr. Edd Arbour at [facility] as his cardiologist. Last OV to Dr. Edd Arbour was 12/03/17, notes are in [place]. Pt is on Eliquis, Dr. Jess Barters PA told them he did not have to stop it prior to surgery. Pt denies any recent chest pain, but does have SOB with exertion. Pt is Diabetic, last A1C found in Care Everywhere was 6.0 on 07/02/17. Pt does not check his blood sugar at home. Pt has had a kidney transplant in 2014.  I spoke with Dr. Lissa Hoard, Anesthesiologist about pt's hx of Mitral Regurgitation. He states pt will be evaluated by the anesthesiologist the day of surgery.

## 2018-01-05 ENCOUNTER — Ambulatory Visit (HOSPITAL_COMMUNITY): Payer: Medicare Other | Admitting: Anesthesiology

## 2018-01-05 ENCOUNTER — Encounter (HOSPITAL_COMMUNITY): Payer: Self-pay | Admitting: *Deleted

## 2018-01-05 ENCOUNTER — Encounter (HOSPITAL_COMMUNITY)
Admission: RE | Disposition: A | Discharge status: Home / Self Care | Payer: Self-pay | Source: Ambulatory Visit | Attending: Orthopedic Surgery

## 2018-01-05 ENCOUNTER — Other Ambulatory Visit: Payer: Self-pay

## 2018-01-05 ENCOUNTER — Ambulatory Visit (HOSPITAL_COMMUNITY)
Admission: RE | Admit: 2018-01-05 | Discharge: 2018-01-05 | Disposition: A | Discharge status: Home / Self Care | Payer: Medicare Other | Source: Ambulatory Visit | Attending: Orthopedic Surgery | Admitting: Orthopedic Surgery

## 2018-01-05 DIAGNOSIS — I110 Hypertensive heart disease with heart failure: Secondary | ICD-10-CM | POA: Insufficient documentation

## 2018-01-05 DIAGNOSIS — I5042 Chronic combined systolic (congestive) and diastolic (congestive) heart failure: Secondary | ICD-10-CM | POA: Diagnosis not present

## 2018-01-05 DIAGNOSIS — M25471 Effusion, right ankle: Secondary | ICD-10-CM | POA: Diagnosis not present

## 2018-01-05 DIAGNOSIS — M25871 Other specified joint disorders, right ankle and foot: Secondary | ICD-10-CM | POA: Diagnosis not present

## 2018-01-05 DIAGNOSIS — I4891 Unspecified atrial fibrillation: Secondary | ICD-10-CM | POA: Diagnosis not present

## 2018-01-05 DIAGNOSIS — G4730 Sleep apnea, unspecified: Secondary | ICD-10-CM | POA: Diagnosis not present

## 2018-01-05 DIAGNOSIS — F329 Major depressive disorder, single episode, unspecified: Secondary | ICD-10-CM | POA: Insufficient documentation

## 2018-01-05 DIAGNOSIS — Z85828 Personal history of other malignant neoplasm of skin: Secondary | ICD-10-CM | POA: Diagnosis not present

## 2018-01-05 DIAGNOSIS — I509 Heart failure, unspecified: Secondary | ICD-10-CM | POA: Insufficient documentation

## 2018-01-05 DIAGNOSIS — E119 Type 2 diabetes mellitus without complications: Secondary | ICD-10-CM | POA: Diagnosis not present

## 2018-01-05 DIAGNOSIS — I872 Venous insufficiency (chronic) (peripheral): Secondary | ICD-10-CM | POA: Insufficient documentation

## 2018-01-05 DIAGNOSIS — I2510 Atherosclerotic heart disease of native coronary artery without angina pectoris: Secondary | ICD-10-CM | POA: Diagnosis not present

## 2018-01-05 DIAGNOSIS — M109 Gout, unspecified: Secondary | ICD-10-CM | POA: Diagnosis not present

## 2018-01-05 DIAGNOSIS — Z7901 Long term (current) use of anticoagulants: Secondary | ICD-10-CM | POA: Diagnosis not present

## 2018-01-05 DIAGNOSIS — Z940 Kidney transplant status: Secondary | ICD-10-CM | POA: Diagnosis not present

## 2018-01-05 DIAGNOSIS — Z79899 Other long term (current) drug therapy: Secondary | ICD-10-CM | POA: Diagnosis not present

## 2018-01-05 HISTORY — DX: Unspecified osteoarthritis, unspecified site: M19.90

## 2018-01-05 HISTORY — PX: ANKLE ARTHROSCOPY: SHX545

## 2018-01-05 HISTORY — DX: Dyspnea, unspecified: R06.00

## 2018-01-05 HISTORY — DX: Anemia, unspecified: D64.9

## 2018-01-05 HISTORY — DX: Malignant (primary) neoplasm, unspecified: C80.1

## 2018-01-05 HISTORY — DX: Atherosclerotic heart disease of native coronary artery without angina pectoris: I25.10

## 2018-01-05 HISTORY — DX: Personal history of other diseases of the digestive system: Z87.19

## 2018-01-05 HISTORY — DX: Pneumonia, unspecified organism: J18.9

## 2018-01-05 HISTORY — DX: Cardiac arrhythmia, unspecified: I49.9

## 2018-01-05 HISTORY — DX: Depression, unspecified: F32.A

## 2018-01-05 HISTORY — DX: Sleep apnea, unspecified: G47.30

## 2018-01-05 HISTORY — DX: Major depressive disorder, single episode, unspecified: F32.9

## 2018-01-05 LAB — GLUCOSE, CAPILLARY
Glucose-Capillary: 136 mg/dL — ABNORMAL HIGH (ref 70–99)
Glucose-Capillary: 156 mg/dL — ABNORMAL HIGH (ref 70–99)
Glucose-Capillary: 160 mg/dL — ABNORMAL HIGH (ref 70–99)

## 2018-01-05 LAB — BASIC METABOLIC PANEL
Anion gap: 10 (ref 5–15)
BUN: 29 mg/dL — AB (ref 8–23)
CHLORIDE: 103 mmol/L (ref 98–111)
CO2: 24 mmol/L (ref 22–32)
Calcium: 8.6 mg/dL — ABNORMAL LOW (ref 8.9–10.3)
Creatinine, Ser: 1.61 mg/dL — ABNORMAL HIGH (ref 0.61–1.24)
GFR calc Af Amer: 48 mL/min — ABNORMAL LOW (ref >60)
GFR calc non Af Amer: 41 mL/min — ABNORMAL LOW (ref >60)
GLUCOSE: 162 mg/dL — AB (ref 70–99)
Potassium: 4.4 mmol/L (ref 3.5–5.1)
Sodium: 137 mmol/L (ref 135–145)

## 2018-01-05 LAB — CBC
HEMATOCRIT: 39 % (ref 39.0–52.0)
HEMOGLOBIN: 11.4 g/dL — AB (ref 13.0–17.0)
MCH: 26.8 pg (ref 26.0–34.0)
MCHC: 29.2 g/dL — AB (ref 30.0–36.0)
MCV: 91.8 fL (ref 78.0–100.0)
Platelets: 232 10*3/uL (ref 150–400)
RBC: 4.25 MIL/uL (ref 4.22–5.81)
RDW: 18.1 % — ABNORMAL HIGH (ref 11.5–15.5)
WBC: 9.2 10*3/uL (ref 4.0–10.5)

## 2018-01-05 LAB — PROTIME-INR
INR: 1.43
Prothrombin Time: 17.3 seconds — ABNORMAL HIGH (ref 11.4–15.2)

## 2018-01-05 SURGERY — ARTHROSCOPY, ANKLE
Anesthesia: General | Laterality: Right

## 2018-01-05 MED ORDER — ONDANSETRON HCL 4 MG/2ML IJ SOLN
INTRAMUSCULAR | Status: AC
  Filled 2018-01-05: qty 2

## 2018-01-05 MED ORDER — SUCCINYLCHOLINE CHLORIDE 200 MG/10ML IV SOSY
PREFILLED_SYRINGE | INTRAVENOUS | Status: AC
  Filled 2018-01-05: qty 10

## 2018-01-05 MED ORDER — PROPOFOL 10 MG/ML IV BOLUS
INTRAVENOUS | Status: AC
  Filled 2018-01-05: qty 20

## 2018-01-05 MED ORDER — SODIUM CHLORIDE 0.9 % IR SOLN
Status: DC | PRN
  Administered 2018-01-05: 3000 mL

## 2018-01-05 MED ORDER — FENTANYL CITRATE (PF) 100 MCG/2ML IJ SOLN
INTRAMUSCULAR | Status: AC
  Filled 2018-01-05: qty 2

## 2018-01-05 MED ORDER — CHLORHEXIDINE GLUCONATE 4 % EX LIQD: 60.0000 mL | Freq: Once | CUTANEOUS | Status: DC

## 2018-01-05 MED ORDER — 0.9 % SODIUM CHLORIDE (POUR BTL) OPTIME
TOPICAL | Status: DC | PRN
  Administered 2018-01-05: 1000 mL

## 2018-01-05 MED ORDER — FENTANYL CITRATE (PF) 250 MCG/5ML IJ SOLN
INTRAMUSCULAR | Status: AC
  Filled 2018-01-05: qty 5

## 2018-01-05 MED ORDER — MEPERIDINE HCL 50 MG/ML IJ SOLN: 6.2500 mg | INTRAMUSCULAR | Status: DC | PRN

## 2018-01-05 MED ORDER — FENTANYL CITRATE (PF) 100 MCG/2ML IJ SOLN: 25.0000 ug | INTRAMUSCULAR | Status: DC | PRN

## 2018-01-05 MED ORDER — MIDAZOLAM HCL 2 MG/2ML IJ SOLN
INTRAMUSCULAR | Status: AC
  Filled 2018-01-05: qty 2

## 2018-01-05 MED ORDER — SODIUM CHLORIDE 0.9 % IV SOLN
INTRAVENOUS | Status: DC
  Administered 2018-01-05: 14:00:00 via INTRAVENOUS

## 2018-01-05 MED ORDER — CEFAZOLIN SODIUM-DEXTROSE 2-4 GM/100ML-% IV SOLN
2.0000 g | INTRAVENOUS | Status: AC
  Administered 2018-01-05: 2 g via INTRAVENOUS

## 2018-01-05 MED ORDER — LIDOCAINE 2% (20 MG/ML) 5 ML SYRINGE
INTRAMUSCULAR | Status: DC | PRN
  Administered 2018-01-05: 80 mg via INTRAVENOUS

## 2018-01-05 MED ORDER — FENTANYL CITRATE (PF) 250 MCG/5ML IJ SOLN
INTRAMUSCULAR | Status: DC | PRN
  Administered 2018-01-05: 50 ug via INTRAVENOUS

## 2018-01-05 MED ORDER — SODIUM CHLORIDE 0.9 % IV SOLN
INTRAVENOUS | Status: DC | PRN
  Administered 2018-01-05: 25 ug/min via INTRAVENOUS

## 2018-01-05 MED ORDER — CEFAZOLIN SODIUM-DEXTROSE 2-4 GM/100ML-% IV SOLN
INTRAVENOUS | Status: AC
  Filled 2018-01-05: qty 100

## 2018-01-05 MED ORDER — CEFAZOLIN SODIUM 1 G IJ SOLR
INTRAMUSCULAR | Status: AC
  Filled 2018-01-05: qty 20

## 2018-01-05 MED ORDER — ONDANSETRON HCL 4 MG/2ML IJ SOLN
INTRAMUSCULAR | Status: DC | PRN
  Administered 2018-01-05: 4 mg via INTRAVENOUS

## 2018-01-05 MED ORDER — PROPOFOL 10 MG/ML IV BOLUS
INTRAVENOUS | Status: DC | PRN
  Administered 2018-01-05: 100 mg via INTRAVENOUS

## 2018-01-05 MED ORDER — PHENYLEPHRINE 40 MCG/ML (10ML) SYRINGE FOR IV PUSH (FOR BLOOD PRESSURE SUPPORT)
PREFILLED_SYRINGE | INTRAVENOUS | Status: DC | PRN
  Administered 2018-01-05: 80 ug via INTRAVENOUS
  Administered 2018-01-05 (×2): 120 ug via INTRAVENOUS
  Administered 2018-01-05: 80 ug via INTRAVENOUS

## 2018-01-05 MED ORDER — LIDOCAINE 2% (20 MG/ML) 5 ML SYRINGE
INTRAMUSCULAR | Status: AC
  Filled 2018-01-05: qty 5

## 2018-01-05 SURGICAL SUPPLY — 45 items
BLADE CUDA 5.5 (BLADE) IMPLANT
BLADE GREAT WHITE 4.2 (BLADE) IMPLANT
BLADE GREAT WHITE 4.2MM (BLADE)
BLADE INCISOR PLUS 5.5 (BLADE) IMPLANT
BNDG COHESIVE 6X5 TAN STRL LF (GAUZE/BANDAGES/DRESSINGS) ×3 IMPLANT
BNDG GAUZE ELAST 4 BULKY (GAUZE/BANDAGES/DRESSINGS) ×3 IMPLANT
BUR OVAL 4.0 (BURR) IMPLANT
COVER SURGICAL LIGHT HANDLE (MISCELLANEOUS) ×6 IMPLANT
CUFF TOURNIQUET SINGLE 34IN LL (TOURNIQUET CUFF) IMPLANT
CUFF TOURNIQUET SINGLE 44IN (TOURNIQUET CUFF) IMPLANT
DRAPE ARTHROSCOPY W/POUCH 114 (DRAPES) ×3 IMPLANT
DRAPE C-ARM MINI 42X72 WSTRAPS (DRAPES) IMPLANT
DRAPE U-SHAPE 47X51 STRL (DRAPES) ×3 IMPLANT
DRSG EMULSION OIL 3X3 NADH (GAUZE/BANDAGES/DRESSINGS) ×3 IMPLANT
DRSG PAD ABDOMINAL 8X10 ST (GAUZE/BANDAGES/DRESSINGS) ×3 IMPLANT
DURAPREP 26ML APPLICATOR (WOUND CARE) ×3 IMPLANT
GAUZE SPONGE 4X4 12PLY STRL (GAUZE/BANDAGES/DRESSINGS) ×3 IMPLANT
GLOVE BIOGEL PI IND STRL 9 (GLOVE) ×1 IMPLANT
GLOVE BIOGEL PI INDICATOR 9 (GLOVE) ×2
GLOVE SURG ORTHO 9.0 STRL STRW (GLOVE) ×3 IMPLANT
GOWN STRL REUS W/ TWL XL LVL3 (GOWN DISPOSABLE) ×3 IMPLANT
GOWN STRL REUS W/TWL XL LVL3 (GOWN DISPOSABLE) ×6
KIT BASIN OR (CUSTOM PROCEDURE TRAY) ×3 IMPLANT
KIT TURNOVER KIT B (KITS) ×3 IMPLANT
MANIFOLD NEPTUNE II (INSTRUMENTS) ×3 IMPLANT
NEEDLE 18GX1X1/2 (RX/OR ONLY) (NEEDLE) ×3 IMPLANT
PACK ARTHROSCOPY DSU (CUSTOM PROCEDURE TRAY) ×3 IMPLANT
PAD ABD 8X10 STRL (GAUZE/BANDAGES/DRESSINGS) ×3 IMPLANT
PAD ARMBOARD 7.5X6 YLW CONV (MISCELLANEOUS) ×6 IMPLANT
PADDING CAST COTTON 6X4 STRL (CAST SUPPLIES) ×3 IMPLANT
SET ARTHROSCOPY TUBING (MISCELLANEOUS) ×2
SET ARTHROSCOPY TUBING LN (MISCELLANEOUS) ×1 IMPLANT
SPONGE LAP 4X18 RFD (DISPOSABLE) ×3 IMPLANT
SUT ETHILON 4 0 PS 2 18 (SUTURE) ×3 IMPLANT
SUT MNCRL AB 3-0 PS2 18 (SUTURE) IMPLANT
SUT VIC AB 2-0 CT1 27 (SUTURE)
SUT VIC AB 2-0 CT1 TAPERPNT 27 (SUTURE) IMPLANT
SWAB COLLECTION DEVICE MRSA (MISCELLANEOUS) ×3 IMPLANT
SWAB CULTURE ESWAB REG 1ML (MISCELLANEOUS) ×3 IMPLANT
SYR 20CC LL (SYRINGE) ×3 IMPLANT
TAPE STRIPS DRAPE STRL (GAUZE/BANDAGES/DRESSINGS) IMPLANT
TOWEL OR 17X24 6PK STRL BLUE (TOWEL DISPOSABLE) ×3 IMPLANT
TOWEL OR 17X26 10 PK STRL BLUE (TOWEL DISPOSABLE) ×3 IMPLANT
WAND HAND CNTRL MULTIVAC 90 (MISCELLANEOUS) IMPLANT
WATER STERILE IRR 1000ML POUR (IV SOLUTION) ×3 IMPLANT

## 2018-01-05 NOTE — Anesthesia Preprocedure Evaluation (Signed)
Anesthesia Evaluation  Patient identified by MRN, date of birth, ID band Patient awake    Reviewed: Allergy & Precautions, H&P , NPO status , Patient's Chart, lab work & pertinent test results, reviewed documented beta blocker date and time   Airway Mallampati: II  TM Distance: >3 FB Neck ROM: full    Dental no notable dental hx.    Pulmonary sleep apnea ,    Pulmonary exam normal breath sounds clear to auscultation       Cardiovascular Exercise Tolerance: Good hypertension, Pt. on medications and Pt. on home beta blockers + CAD, + Peripheral Vascular Disease and +CHF  + dysrhythmias Atrial Fibrillation  Rhythm:regular Rate:Normal     Neuro/Psych negative neurological ROS  negative psych ROS   GI/Hepatic hiatal hernia,   Endo/Other  diabetes, Type 2  Renal/GU Renal disease  negative genitourinary   Musculoskeletal  (+) Arthritis , Osteoarthritis,    Abdominal   Peds  Hematology negative hematology ROS (+)   Anesthesia Other Findings   Reproductive/Obstetrics negative OB ROS                             Anesthesia Physical Anesthesia Plan  ASA: III  Anesthesia Plan: General   Post-op Pain Management:    Induction: Intravenous  PONV Risk Score and Plan: 2 and Ondansetron, Treatment may vary due to age or medical condition and Dexamethasone  Airway Management Planned: LMA  Additional Equipment:   Intra-op Plan:   Post-operative Plan:   Informed Consent: I have reviewed the patients History and Physical, chart, labs and discussed the procedure including the risks, benefits and alternatives for the proposed anesthesia with the patient or authorized representative who has indicated his/her understanding and acceptance.   Dental Advisory Given  Plan Discussed with: CRNA, Anesthesiologist and Surgeon  Anesthesia Plan Comments: ( )        Anesthesia Quick Evaluation

## 2018-01-05 NOTE — Transfer of Care (Signed)
Immediate Anesthesia Transfer of Care Note  Patient: Matthew Rowland  Procedure(s) Performed: RIGHT ANKLE ARTHROSCOPY AND DEBRIDEMENT (Right )  Patient Location: PACU  Anesthesia Type:General  Level of Consciousness: awake, alert  and oriented  Airway & Oxygen Therapy: Patient Spontanous Breathing  Post-op Assessment: Report given to RN  Post vital signs: Reviewed and stable  Last Vitals:  Vitals Value Taken Time  BP 85/69 01/05/2018  3:36 PM  Temp    Pulse 107 01/05/2018  3:36 PM  Resp 24 01/05/2018  3:37 PM  SpO2 100 % 01/05/2018  3:36 PM  Vitals shown include unvalidated device data.  Last Pain:  Vitals:   01/05/18 1214  TempSrc:   PainSc: 2       Patients Stated Pain Goal: 0 (47/20/72 1828)  Complications: No apparent anesthesia complications

## 2018-01-05 NOTE — Anesthesia Procedure Notes (Signed)
Procedure Name: LMA Insertion Date/Time: 01/05/2018 2:30 PM Performed by: Myna Bright, CRNA Pre-anesthesia Checklist: Patient identified, Emergency Drugs available, Patient being monitored and Suction available Patient Re-evaluated:Patient Re-evaluated prior to induction Oxygen Delivery Method: Circle system utilized Preoxygenation: Pre-oxygenation with 100% oxygen Induction Type: IV induction LMA: LMA inserted LMA Size: 5.0 Tube type: Oral Number of attempts: 1 Placement Confirmation: positive ETCO2 and breath sounds checked- equal and bilateral Tube secured with: Tape Dental Injury: Teeth and Oropharynx as per pre-operative assessment

## 2018-01-05 NOTE — Op Note (Signed)
01/05/2018  3:19 PM  PATIENT:  Matthew Rowland    PRE-OPERATIVE DIAGNOSIS:  Effusion Right Ankle  POST-OPERATIVE DIAGNOSIS:  Same  PROCEDURE:  RIGHT ANKLE ARTHROSCOPY AND DEBRIDEMENT  SURGEON:  Newt Minion, MD  PHYSICIAN ASSISTANT:None ANESTHESIA:   General  PREOPERATIVE INDICATIONS:  [name] is a  72 y.o. male with a diagnosis of Effusion Right Ankle who failed conservative measures and elected for surgical management.    The risks benefits and alternatives were discussed with the patient preoperatively including but not limited to the risks of infection, bleeding, nerve injury, cardiopulmonary complications, the need for revision surgery, among others, and the patient was willing to proceed.  OPERATIVE IMPLANTS: None  @ENCIMAGES @  OPERATIVE FINDINGS: No gouty changes no purulence no abscess there were loose bodies.  OPERATIVE PROCEDURE: Patient was brought the operating room and underwent a general anesthetic.  After adequate levels of anesthesia were obtained patient's right lower extremity was prepped using DuraPrep draped in the sterile field a timeout was called.  The scope was inserted to the anterior medial portal and anterior lateral portal was used for working portal.  The skin was incised blunt dissection was carried down the capsule and a blunt trocar was used to insert into the joint.  The initial fluid was sent for cultures and sensitivities.  The fluid was clear serosanguineous no purulence.  Visualization showed extensive synovitis with multiple loose bodies.  The shaver was used to debride the impingement from the medial and lateral gutters as well as anteriorly the loose bodies were removed.  Electrocautery was used for hemostasis.  The articular cartilage was intact there was no clinical signs of gout or infection.  Patient received Kefzol after the cultures were obtained.  The incisions were closed using 2-0 nylon a sterile compressive dressing was applied.   Patient was extubated taken the PACU in stable condition plan for discharge to home will initiate antibiotic therapy if cultures are positive.   DISCHARGE PLANNING:  Antibiotic duration: Preoperative  Weightbearing: Weightbearing as tolerated  Pain medication: Patient has hydrocodone 7.5 mg  Dressing care/ Wound VAC: Continue dressing for 3 days then change to the compression stocking  Ambulatory devices: Walker or crutches   Discharge to: Home.  Follow-up: In the office 1 week post operative.

## 2018-01-05 NOTE — Anesthesia Postprocedure Evaluation (Signed)
Anesthesia Post Note  Patient: Matthew Rowland  Procedure(s) Performed: RIGHT ANKLE ARTHROSCOPY AND DEBRIDEMENT (Right )     Patient location during evaluation: PACU Anesthesia Type: General Level of consciousness: awake and alert Pain management: pain level controlled Vital Signs Assessment: post-procedure vital signs reviewed and stable Respiratory status: spontaneous breathing, nonlabored ventilation, respiratory function stable and patient connected to nasal cannula oxygen Cardiovascular status: blood pressure returned to baseline and stable Postop Assessment: no apparent nausea or vomiting Anesthetic complications: no    Last Vitals:  Vitals:   01/05/18 1554 01/05/18 1609  BP: (!) 85/63 (!) 83/65  Pulse:    Resp: (!) 21 (!) 23  Temp:    SpO2:      Last Pain:  Vitals:   01/05/18 1525  TempSrc:   PainSc: 3                  Alonzo Loving

## 2018-01-05 NOTE — Progress Notes (Signed)
Orthopedic Tech Progress Note Patient Details:  Matthew Rowland 11-17-1945 [id number]  Ortho Devices Type of Ortho Device: Postop shoe/boot Ortho Device/Splint Location: rle Ortho Device/Splint Interventions: Application   Post Interventions Patient Tolerated: Well Instructions Provided: Care of device Viewed order from doctor's order list  [name] 01/05/2018, 4:45 PM

## 2018-01-05 NOTE — H&P (Signed)
Matthew Rowland is an 72 y.o. male.   Chief Complaint: Pain and swelling right ankle HPI: Patient is a 72 year old gentleman with venous insufficiency with pain and swelling the right ankle patient has had aspiration with recurrent effusion cell count showed a white blood cell count greater than 5000 with over 90% PMN.  Past Medical History:  Diagnosis Date  . A-fib ([place])   . Anemia   . Arthritis    gout  . Cancer ([place])    basal and squamous cell skin cancer  . CHF (congestive heart failure) ([place])   . Chronic diarrhea   . Coronary artery disease 2013   [facility] in [place], [place]  . Depression   . Diabetes mellitus without complication (HCC)    borderline  . Dyspnea    with exertion  . History of hiatal hernia   . History of ITP   . Hypertension   . Pancreatitis   . Pneumonia   . Renal disorder   . Sleep apnea    no cpap after weight loss    Past Surgical History:  Procedure Laterality Date  . ABDOMINAL SURGERY    . CARDIAC CATHETERIZATION    . CHOLECYSTECTOMY    . COLONOSCOPY    . CORONARY ANGIOPLASTY    . KIDNEY TRANSPLANT  2014  . TONSILLECTOMY      History reviewed. No pertinent family history. Social History:  reports that he has never smoked. He has never used smokeless tobacco. He reports that he does not drink alcohol or use drugs.  Allergies:  Allergies  Allergen Reactions  . Atorvastatin Itching    Incessant itching which stopped when he stopped the atorvastatin.  Marland Kitchen Lisinopril Other (See Comments)    Causes pancreatitis     No medications prior to admission.    No results found for this or any previous visit (from the past 48 hour(s)). No results found.  Review of Systems  All other systems reviewed and are negative.   There were no vitals taken for this visit. Physical Exam  Examination patient is alert oriented no adenopathy well-dressed normal affect normal respiratory effort he has an antalgic gait.  Patient has a good pulse there is  warmth and redness around the ankle this is different than the dermatitis in his leg from his venous insufficiency there is tenderness to palpation.  Aspiration has been negative for gout positive for elevated white cell count with a left shift. Assessment/Plan Assessment: Possible septic right ankle.  Plan: We will plan for arthroscopic debridement of the right ankle risks and benefits were discussed including persistent infection neurovascular injury need for additional surgery.  Patient states he understands wished to proceed at this time.  Newt Minion, MD 01/05/2018, 6:48 AM

## 2018-01-06 ENCOUNTER — Encounter (HOSPITAL_COMMUNITY): Payer: Self-pay | Admitting: Orthopedic Surgery

## 2018-01-06 ENCOUNTER — Telehealth (INDEPENDENT_AMBULATORY_CARE_PROVIDER_SITE_OTHER): Payer: Self-pay | Admitting: Orthopedic Surgery

## 2018-01-06 ENCOUNTER — Telehealth: Payer: Self-pay | Admitting: Family Medicine

## 2018-01-06 DIAGNOSIS — D631 Anemia in chronic kidney disease: Secondary | ICD-10-CM | POA: Diagnosis not present

## 2018-01-06 DIAGNOSIS — I5042 Chronic combined systolic (congestive) and diastolic (congestive) heart failure: Secondary | ICD-10-CM | POA: Diagnosis not present

## 2018-01-06 DIAGNOSIS — I482 Chronic atrial fibrillation: Secondary | ICD-10-CM | POA: Diagnosis not present

## 2018-01-06 DIAGNOSIS — I130 Hypertensive heart and chronic kidney disease with heart failure and stage 1 through stage 4 chronic kidney disease, or unspecified chronic kidney disease: Secondary | ICD-10-CM | POA: Diagnosis not present

## 2018-01-06 DIAGNOSIS — E1122 Type 2 diabetes mellitus with diabetic chronic kidney disease: Secondary | ICD-10-CM | POA: Diagnosis not present

## 2018-01-06 DIAGNOSIS — N183 Chronic kidney disease, stage 3 (moderate): Secondary | ICD-10-CM | POA: Diagnosis not present

## 2018-01-06 LAB — HEMOGLOBIN A1C
Hgb A1c MFr Bld: 8 % — ABNORMAL HIGH (ref 4.8–5.6)
Mean Plasma Glucose: 183 mg/dL

## 2018-01-06 NOTE — Telephone Encounter (Signed)
Tried calling [employer]. No answer, mailbox full unable to leave message. Will try again later.

## 2018-01-06 NOTE — Telephone Encounter (Signed)
Copied from [place] [phone]. Topic: Quick Communication - See Telephone Encounter >> Jan 06, 2018 12:59 PM Bea Graff, NT wrote: CRM for notification. See Telephone encounter for: 01/06/18. Lysbeth Galas with [facility] calling to request verbal orders for social worker evaluation. CB#: [phone]

## 2018-01-06 NOTE — Telephone Encounter (Signed)
Matthew Rowland returning call to Matthew Rowland. She also states pt fell this morning and was not injured and helped back to bed. CB#: [phone]

## 2018-01-06 NOTE — Telephone Encounter (Signed)
Patient's wife Clarise Cruz called advised patient is having quite a bit of pain today. She also said patient fell this morning but don't think he jarred the ankle. Clarise Cruz asked if patient can get something stronger for pain. She advised the Hydrocodone 7.5 is not helping with the pain. The number to contact Clarise Cruz is [phone]

## 2018-01-07 ENCOUNTER — Telehealth (INDEPENDENT_AMBULATORY_CARE_PROVIDER_SITE_OTHER): Payer: Self-pay | Admitting: Physician Assistant

## 2018-01-07 ENCOUNTER — Other Ambulatory Visit (INDEPENDENT_AMBULATORY_CARE_PROVIDER_SITE_OTHER): Payer: Self-pay | Admitting: Physician Assistant

## 2018-01-07 DIAGNOSIS — I482 Chronic atrial fibrillation, unspecified: Secondary | ICD-10-CM | POA: Diagnosis not present

## 2018-01-07 DIAGNOSIS — I129 Hypertensive chronic kidney disease with stage 1 through stage 4 chronic kidney disease, or unspecified chronic kidney disease: Secondary | ICD-10-CM | POA: Diagnosis not present

## 2018-01-07 DIAGNOSIS — J90 Pleural effusion, not elsewhere classified: Secondary | ICD-10-CM | POA: Diagnosis not present

## 2018-01-07 DIAGNOSIS — I130 Hypertensive heart and chronic kidney disease with heart failure and stage 1 through stage 4 chronic kidney disease, or unspecified chronic kidney disease: Secondary | ICD-10-CM | POA: Diagnosis not present

## 2018-01-07 DIAGNOSIS — R5383 Other fatigue: Secondary | ICD-10-CM | POA: Diagnosis not present

## 2018-01-07 DIAGNOSIS — M25571 Pain in right ankle and joints of right foot: Secondary | ICD-10-CM | POA: Diagnosis not present

## 2018-01-07 DIAGNOSIS — G5632 Lesion of radial nerve, left upper limb: Secondary | ICD-10-CM | POA: Diagnosis not present

## 2018-01-07 DIAGNOSIS — Z940 Kidney transplant status: Secondary | ICD-10-CM | POA: Diagnosis not present

## 2018-01-07 DIAGNOSIS — K861 Other chronic pancreatitis: Secondary | ICD-10-CM | POA: Diagnosis not present

## 2018-01-07 DIAGNOSIS — I4891 Unspecified atrial fibrillation: Secondary | ICD-10-CM | POA: Diagnosis not present

## 2018-01-07 DIAGNOSIS — E1122 Type 2 diabetes mellitus with diabetic chronic kidney disease: Secondary | ICD-10-CM | POA: Diagnosis not present

## 2018-01-07 DIAGNOSIS — D631 Anemia in chronic kidney disease: Secondary | ICD-10-CM | POA: Diagnosis not present

## 2018-01-07 DIAGNOSIS — M89271 Other disorders of bone development and growth, right ankle and foot: Secondary | ICD-10-CM | POA: Diagnosis not present

## 2018-01-07 DIAGNOSIS — R9431 Abnormal electrocardiogram [ECG] [EKG]: Secondary | ICD-10-CM | POA: Diagnosis not present

## 2018-01-07 DIAGNOSIS — M86671 Other chronic osteomyelitis, right ankle and foot: Secondary | ICD-10-CM | POA: Diagnosis not present

## 2018-01-07 DIAGNOSIS — M7989 Other specified soft tissue disorders: Secondary | ICD-10-CM | POA: Diagnosis not present

## 2018-01-07 DIAGNOSIS — E43 Unspecified severe protein-calorie malnutrition: Secondary | ICD-10-CM | POA: Diagnosis not present

## 2018-01-07 DIAGNOSIS — I5042 Chronic combined systolic (congestive) and diastolic (congestive) heart failure: Secondary | ICD-10-CM | POA: Diagnosis not present

## 2018-01-07 DIAGNOSIS — D899 Disorder involving the immune mechanism, unspecified: Secondary | ICD-10-CM | POA: Diagnosis not present

## 2018-01-07 DIAGNOSIS — I517 Cardiomegaly: Secondary | ICD-10-CM | POA: Diagnosis not present

## 2018-01-07 DIAGNOSIS — M00871 Arthritis due to other bacteria, right ankle and foot: Secondary | ICD-10-CM | POA: Diagnosis not present

## 2018-01-07 DIAGNOSIS — N183 Chronic kidney disease, stage 3 (moderate): Secondary | ICD-10-CM | POA: Diagnosis not present

## 2018-01-07 MED ORDER — OXYCODONE-ACETAMINOPHEN 5-325 MG PO TABS: 1.0000 | ORAL_TABLET | ORAL | 0 refills | Status: AC | PRN

## 2018-01-07 NOTE — Telephone Encounter (Signed)
Will leave prescription for Percocet for patient.

## 2018-01-07 NOTE — Telephone Encounter (Signed)
Please advise on message below. Can he get something stronger?

## 2018-01-07 NOTE — Telephone Encounter (Signed)
Called Matthew Rowland and did give verbal ok per PCP for social work evaluation.

## 2018-01-07 NOTE — Telephone Encounter (Signed)
Patient's wife picked up Rx today, 01/07/2018.

## 2018-01-07 NOTE — Telephone Encounter (Signed)
Patient's wife presents to the clinic to pick up a Rx for pain medications, but also reports the patient is very weak .  Reviewed the recent labs and Operative cultures initially negative, but now growing pan-sensitive Pseudomonas a., and WBC was 9.2. Wife reports has never been higher than 4 due to his suppressive medications following his kidney transplant.  Advised patient's wife to take the patient to the ER to be admitted for IV antibiotics/possible sepsis. Wife reports patient not willing to go to [facility] and they will plan to go to [facility] in [place].  Wife was given copies of the patient's recent lab data.    [place]

## 2018-01-07 NOTE — Telephone Encounter (Signed)
Patient can pick up Rx today.

## 2018-01-08 DIAGNOSIS — K5900 Constipation, unspecified: Secondary | ICD-10-CM | POA: Diagnosis present

## 2018-01-08 DIAGNOSIS — I482 Chronic atrial fibrillation, unspecified: Secondary | ICD-10-CM | POA: Diagnosis present

## 2018-01-08 DIAGNOSIS — R911 Solitary pulmonary nodule: Secondary | ICD-10-CM | POA: Diagnosis not present

## 2018-01-08 DIAGNOSIS — E11621 Type 2 diabetes mellitus with foot ulcer: Secondary | ICD-10-CM | POA: Diagnosis not present

## 2018-01-08 DIAGNOSIS — D485 Neoplasm of uncertain behavior of skin: Secondary | ICD-10-CM | POA: Diagnosis not present

## 2018-01-08 DIAGNOSIS — R000 Tachycardia, unspecified: Secondary | ICD-10-CM | POA: Diagnosis not present

## 2018-01-08 DIAGNOSIS — M868X7 Other osteomyelitis, ankle and foot: Secondary | ICD-10-CM | POA: Diagnosis not present

## 2018-01-08 DIAGNOSIS — E1122 Type 2 diabetes mellitus with diabetic chronic kidney disease: Secondary | ICD-10-CM | POA: Diagnosis present

## 2018-01-08 DIAGNOSIS — M00832 Arthritis due to other bacteria, left wrist: Secondary | ICD-10-CM | POA: Diagnosis not present

## 2018-01-08 DIAGNOSIS — E1169 Type 2 diabetes mellitus with other specified complication: Secondary | ICD-10-CM | POA: Diagnosis not present

## 2018-01-08 DIAGNOSIS — C4490 Unspecified malignant neoplasm of skin, unspecified: Secondary | ICD-10-CM | POA: Diagnosis not present

## 2018-01-08 DIAGNOSIS — Z940 Kidney transplant status: Secondary | ICD-10-CM | POA: Diagnosis not present

## 2018-01-08 DIAGNOSIS — K529 Noninfective gastroenteritis and colitis, unspecified: Secondary | ICD-10-CM | POA: Diagnosis not present

## 2018-01-08 DIAGNOSIS — K8689 Other specified diseases of pancreas: Secondary | ICD-10-CM | POA: Diagnosis not present

## 2018-01-08 DIAGNOSIS — S92201S Fracture of unspecified tarsal bone(s) of right foot, sequela: Secondary | ICD-10-CM | POA: Diagnosis not present

## 2018-01-08 DIAGNOSIS — M25571 Pain in right ankle and joints of right foot: Secondary | ICD-10-CM | POA: Diagnosis not present

## 2018-01-08 DIAGNOSIS — N184 Chronic kidney disease, stage 4 (severe): Secondary | ICD-10-CM | POA: Diagnosis present

## 2018-01-08 DIAGNOSIS — E118 Type 2 diabetes mellitus with unspecified complications: Secondary | ICD-10-CM | POA: Diagnosis not present

## 2018-01-08 DIAGNOSIS — R531 Weakness: Secondary | ICD-10-CM | POA: Diagnosis not present

## 2018-01-08 DIAGNOSIS — R279 Unspecified lack of coordination: Secondary | ICD-10-CM | POA: Diagnosis not present

## 2018-01-08 DIAGNOSIS — K861 Other chronic pancreatitis: Secondary | ICD-10-CM | POA: Diagnosis present

## 2018-01-08 DIAGNOSIS — A4152 Sepsis due to Pseudomonas: Secondary | ICD-10-CM | POA: Diagnosis not present

## 2018-01-08 DIAGNOSIS — M009 Pyogenic arthritis, unspecified: Secondary | ICD-10-CM | POA: Diagnosis not present

## 2018-01-08 DIAGNOSIS — R197 Diarrhea, unspecified: Secondary | ICD-10-CM | POA: Diagnosis not present

## 2018-01-08 DIAGNOSIS — I4891 Unspecified atrial fibrillation: Secondary | ICD-10-CM | POA: Diagnosis not present

## 2018-01-08 DIAGNOSIS — L02415 Cutaneous abscess of right lower limb: Secondary | ICD-10-CM | POA: Diagnosis present

## 2018-01-08 DIAGNOSIS — Z5181 Encounter for therapeutic drug level monitoring: Secondary | ICD-10-CM | POA: Diagnosis not present

## 2018-01-08 DIAGNOSIS — I2510 Atherosclerotic heart disease of native coronary artery without angina pectoris: Secondary | ICD-10-CM | POA: Diagnosis present

## 2018-01-08 DIAGNOSIS — Z452 Encounter for adjustment and management of vascular access device: Secondary | ICD-10-CM | POA: Diagnosis not present

## 2018-01-08 DIAGNOSIS — Z7901 Long term (current) use of anticoagulants: Secondary | ICD-10-CM | POA: Diagnosis not present

## 2018-01-08 DIAGNOSIS — E861 Hypovolemia: Secondary | ICD-10-CM | POA: Diagnosis not present

## 2018-01-08 DIAGNOSIS — Z9889 Other specified postprocedural states: Secondary | ICD-10-CM | POA: Diagnosis not present

## 2018-01-08 DIAGNOSIS — R770 Abnormality of albumin: Secondary | ICD-10-CM | POA: Diagnosis not present

## 2018-01-08 DIAGNOSIS — E119 Type 2 diabetes mellitus without complications: Secondary | ICD-10-CM | POA: Diagnosis not present

## 2018-01-08 DIAGNOSIS — E1165 Type 2 diabetes mellitus with hyperglycemia: Secondary | ICD-10-CM | POA: Diagnosis not present

## 2018-01-08 DIAGNOSIS — D848 Other specified immunodeficiencies: Secondary | ICD-10-CM | POA: Diagnosis not present

## 2018-01-08 DIAGNOSIS — I517 Cardiomegaly: Secondary | ICD-10-CM | POA: Diagnosis not present

## 2018-01-08 DIAGNOSIS — D849 Immunodeficiency, unspecified: Secondary | ICD-10-CM | POA: Diagnosis not present

## 2018-01-08 DIAGNOSIS — R7881 Bacteremia: Secondary | ICD-10-CM | POA: Diagnosis not present

## 2018-01-08 DIAGNOSIS — M109 Gout, unspecified: Secondary | ICD-10-CM | POA: Diagnosis not present

## 2018-01-08 DIAGNOSIS — Z743 Need for continuous supervision: Secondary | ICD-10-CM | POA: Diagnosis not present

## 2018-01-08 DIAGNOSIS — M19071 Primary osteoarthritis, right ankle and foot: Secondary | ICD-10-CM | POA: Diagnosis not present

## 2018-01-08 DIAGNOSIS — L02611 Cutaneous abscess of right foot: Secondary | ICD-10-CM | POA: Diagnosis not present

## 2018-01-08 DIAGNOSIS — D899 Disorder involving the immune mechanism, unspecified: Secondary | ICD-10-CM | POA: Diagnosis not present

## 2018-01-08 DIAGNOSIS — Z85828 Personal history of other malignant neoplasm of skin: Secondary | ICD-10-CM | POA: Diagnosis not present

## 2018-01-08 DIAGNOSIS — E43 Unspecified severe protein-calorie malnutrition: Secondary | ICD-10-CM | POA: Diagnosis present

## 2018-01-08 DIAGNOSIS — Z681 Body mass index (BMI) 19 or less, adult: Secondary | ICD-10-CM | POA: Diagnosis not present

## 2018-01-08 DIAGNOSIS — E1140 Type 2 diabetes mellitus with diabetic neuropathy, unspecified: Secondary | ICD-10-CM | POA: Diagnosis present

## 2018-01-08 DIAGNOSIS — L0889 Other specified local infections of the skin and subcutaneous tissue: Secondary | ICD-10-CM | POA: Diagnosis not present

## 2018-01-08 DIAGNOSIS — M86671 Other chronic osteomyelitis, right ankle and foot: Secondary | ICD-10-CM | POA: Diagnosis present

## 2018-01-08 DIAGNOSIS — L989 Disorder of the skin and subcutaneous tissue, unspecified: Secondary | ICD-10-CM | POA: Diagnosis present

## 2018-01-08 DIAGNOSIS — L89612 Pressure ulcer of right heel, stage 2: Secondary | ICD-10-CM | POA: Diagnosis present

## 2018-01-08 DIAGNOSIS — L988 Other specified disorders of the skin and subcutaneous tissue: Secondary | ICD-10-CM | POA: Diagnosis not present

## 2018-01-08 DIAGNOSIS — R64 Cachexia: Secondary | ICD-10-CM | POA: Diagnosis not present

## 2018-01-08 DIAGNOSIS — Z89421 Acquired absence of other right toe(s): Secondary | ICD-10-CM | POA: Diagnosis not present

## 2018-01-08 DIAGNOSIS — M00871 Arthritis due to other bacteria, right ankle and foot: Secondary | ICD-10-CM | POA: Diagnosis present

## 2018-01-08 DIAGNOSIS — I129 Hypertensive chronic kidney disease with stage 1 through stage 4 chronic kidney disease, or unspecified chronic kidney disease: Secondary | ICD-10-CM | POA: Diagnosis not present

## 2018-01-08 DIAGNOSIS — E785 Hyperlipidemia, unspecified: Secondary | ICD-10-CM | POA: Diagnosis present

## 2018-01-08 DIAGNOSIS — R9431 Abnormal electrocardiogram [ECG] [EKG]: Secondary | ICD-10-CM | POA: Diagnosis not present

## 2018-01-08 DIAGNOSIS — N183 Chronic kidney disease, stage 3 (moderate): Secondary | ICD-10-CM | POA: Diagnosis not present

## 2018-01-08 DIAGNOSIS — M868X0 Other osteomyelitis, multiple sites: Secondary | ICD-10-CM | POA: Diagnosis not present

## 2018-01-08 DIAGNOSIS — M629 Disorder of muscle, unspecified: Secondary | ICD-10-CM | POA: Diagnosis not present

## 2018-01-08 DIAGNOSIS — E8809 Other disorders of plasma-protein metabolism, not elsewhere classified: Secondary | ICD-10-CM | POA: Diagnosis not present

## 2018-01-08 DIAGNOSIS — I429 Cardiomyopathy, unspecified: Secondary | ICD-10-CM | POA: Diagnosis present

## 2018-01-08 DIAGNOSIS — I5032 Chronic diastolic (congestive) heart failure: Secondary | ICD-10-CM | POA: Diagnosis not present

## 2018-01-08 DIAGNOSIS — L89611 Pressure ulcer of right heel, stage 1: Secondary | ICD-10-CM | POA: Diagnosis not present

## 2018-01-08 DIAGNOSIS — R5381 Other malaise: Secondary | ICD-10-CM | POA: Diagnosis not present

## 2018-01-08 DIAGNOSIS — S96811A Strain of other specified muscles and tendons at ankle and foot level, right foot, initial encounter: Secondary | ICD-10-CM | POA: Diagnosis not present

## 2018-01-08 DIAGNOSIS — I4821 Permanent atrial fibrillation: Secondary | ICD-10-CM | POA: Diagnosis not present

## 2018-01-08 DIAGNOSIS — L538 Other specified erythematous conditions: Secondary | ICD-10-CM | POA: Diagnosis not present

## 2018-01-08 DIAGNOSIS — L89619 Pressure ulcer of right heel, unspecified stage: Secondary | ICD-10-CM | POA: Diagnosis not present

## 2018-01-08 DIAGNOSIS — M6281 Muscle weakness (generalized): Secondary | ICD-10-CM | POA: Diagnosis not present

## 2018-01-08 DIAGNOSIS — I5022 Chronic systolic (congestive) heart failure: Secondary | ICD-10-CM | POA: Diagnosis present

## 2018-01-08 DIAGNOSIS — Z79899 Other long term (current) drug therapy: Secondary | ICD-10-CM | POA: Diagnosis not present

## 2018-01-08 DIAGNOSIS — Z4789 Encounter for other orthopedic aftercare: Secondary | ICD-10-CM | POA: Diagnosis not present

## 2018-01-08 DIAGNOSIS — E872 Acidosis: Secondary | ICD-10-CM | POA: Diagnosis not present

## 2018-01-08 DIAGNOSIS — R944 Abnormal results of kidney function studies: Secondary | ICD-10-CM | POA: Diagnosis not present

## 2018-01-08 DIAGNOSIS — C490 Malignant neoplasm of connective and soft tissue of head, face and neck: Secondary | ICD-10-CM | POA: Diagnosis not present

## 2018-01-08 DIAGNOSIS — I130 Hypertensive heart and chronic kidney disease with heart failure and stage 1 through stage 4 chronic kidney disease, or unspecified chronic kidney disease: Secondary | ICD-10-CM | POA: Diagnosis present

## 2018-01-08 DIAGNOSIS — M7989 Other specified soft tissue disorders: Secondary | ICD-10-CM | POA: Diagnosis not present

## 2018-01-08 DIAGNOSIS — M86271 Subacute osteomyelitis, right ankle and foot: Secondary | ICD-10-CM | POA: Diagnosis not present

## 2018-01-08 DIAGNOSIS — J90 Pleural effusion, not elsewhere classified: Secondary | ICD-10-CM | POA: Diagnosis not present

## 2018-01-08 DIAGNOSIS — G4730 Sleep apnea, unspecified: Secondary | ICD-10-CM | POA: Diagnosis present

## 2018-01-08 DIAGNOSIS — C792 Secondary malignant neoplasm of skin: Secondary | ICD-10-CM | POA: Diagnosis not present

## 2018-01-08 DIAGNOSIS — K5909 Other constipation: Secondary | ICD-10-CM | POA: Diagnosis not present

## 2018-01-08 DIAGNOSIS — E871 Hypo-osmolality and hyponatremia: Secondary | ICD-10-CM | POA: Diagnosis not present

## 2018-01-08 DIAGNOSIS — G5632 Lesion of radial nerve, left upper limb: Secondary | ICD-10-CM | POA: Diagnosis not present

## 2018-01-08 DIAGNOSIS — B965 Pseudomonas (aeruginosa) (mallei) (pseudomallei) as the cause of diseases classified elsewhere: Secondary | ICD-10-CM | POA: Diagnosis present

## 2018-01-08 DIAGNOSIS — Z792 Long term (current) use of antibiotics: Secondary | ICD-10-CM | POA: Diagnosis not present

## 2018-01-08 DIAGNOSIS — M868X6 Other osteomyelitis, lower leg: Secondary | ICD-10-CM | POA: Diagnosis present

## 2018-01-08 DIAGNOSIS — I10 Essential (primary) hypertension: Secondary | ICD-10-CM | POA: Diagnosis not present

## 2018-01-08 DIAGNOSIS — C4440 Unspecified malignant neoplasm of skin of scalp and neck: Secondary | ICD-10-CM | POA: Diagnosis not present

## 2018-01-08 DIAGNOSIS — N189 Chronic kidney disease, unspecified: Secondary | ICD-10-CM | POA: Diagnosis not present

## 2018-01-08 DIAGNOSIS — E875 Hyperkalemia: Secondary | ICD-10-CM | POA: Diagnosis not present

## 2018-01-10 ENCOUNTER — Telehealth (INDEPENDENT_AMBULATORY_CARE_PROVIDER_SITE_OTHER): Payer: Self-pay | Admitting: Orthopedic Surgery

## 2018-01-10 NOTE — Telephone Encounter (Signed)
fyi

## 2018-01-10 NOTE — Telephone Encounter (Signed)
Patient's wife called and wanted to let you know that he is in the hospital at [facility] due to an infection and they are going to do an MRI on his foot.  CB#[phone].  Thank you.

## 2018-01-11 ENCOUNTER — Ambulatory Visit (INDEPENDENT_AMBULATORY_CARE_PROVIDER_SITE_OTHER): Payer: Medicare Other | Admitting: Physician Assistant

## 2018-01-11 LAB — AEROBIC/ANAEROBIC CULTURE W GRAM STAIN (SURGICAL/DEEP WOUND)

## 2018-01-11 LAB — AEROBIC/ANAEROBIC CULTURE (SURGICAL/DEEP WOUND): GRAM STAIN: NONE SEEN

## 2018-01-12 ENCOUNTER — Telehealth: Payer: Self-pay

## 2018-01-12 NOTE — Telephone Encounter (Signed)
Copied from [place] [phone]. Topic: Inquiry >> Jan 11, 2018  1:36 PM Oliver Pila B wrote: Reason for CRM: [facility] called for verbals for social worker; contact [phone]

## 2018-01-12 NOTE — Telephone Encounter (Signed)
Tried calling to give verbal orders, called 3 times. Rang several times then disconnected each time.No name given to reach to give orders.

## 2018-01-25 DIAGNOSIS — Z4789 Encounter for other orthopedic aftercare: Secondary | ICD-10-CM | POA: Diagnosis not present

## 2018-01-25 DIAGNOSIS — I739 Peripheral vascular disease, unspecified: Secondary | ICD-10-CM | POA: Diagnosis not present

## 2018-01-25 DIAGNOSIS — Z9861 Coronary angioplasty status: Secondary | ICD-10-CM | POA: Diagnosis not present

## 2018-01-25 DIAGNOSIS — S8991XA Unspecified injury of right lower leg, initial encounter: Secondary | ICD-10-CM | POA: Diagnosis not present

## 2018-01-25 DIAGNOSIS — N183 Chronic kidney disease, stage 3 (moderate): Secondary | ICD-10-CM | POA: Diagnosis not present

## 2018-01-25 DIAGNOSIS — E1122 Type 2 diabetes mellitus with diabetic chronic kidney disease: Secondary | ICD-10-CM | POA: Diagnosis not present

## 2018-01-25 DIAGNOSIS — M86271 Subacute osteomyelitis, right ankle and foot: Secondary | ICD-10-CM | POA: Diagnosis not present

## 2018-01-25 DIAGNOSIS — I2510 Atherosclerotic heart disease of native coronary artery without angina pectoris: Secondary | ICD-10-CM | POA: Diagnosis not present

## 2018-01-25 DIAGNOSIS — R809 Proteinuria, unspecified: Secondary | ICD-10-CM | POA: Diagnosis not present

## 2018-01-25 DIAGNOSIS — C4449 Other specified malignant neoplasm of skin of scalp and neck: Secondary | ICD-10-CM | POA: Diagnosis not present

## 2018-01-25 DIAGNOSIS — Z743 Need for continuous supervision: Secondary | ICD-10-CM | POA: Diagnosis not present

## 2018-01-25 DIAGNOSIS — Z8619 Personal history of other infectious and parasitic diseases: Secondary | ICD-10-CM | POA: Diagnosis not present

## 2018-01-25 DIAGNOSIS — I340 Nonrheumatic mitral (valve) insufficiency: Secondary | ICD-10-CM | POA: Diagnosis not present

## 2018-01-25 DIAGNOSIS — R64 Cachexia: Secondary | ICD-10-CM | POA: Diagnosis not present

## 2018-01-25 DIAGNOSIS — I4821 Permanent atrial fibrillation: Secondary | ICD-10-CM | POA: Diagnosis not present

## 2018-01-25 DIAGNOSIS — A498 Other bacterial infections of unspecified site: Secondary | ICD-10-CM | POA: Diagnosis not present

## 2018-01-25 DIAGNOSIS — M00871 Arthritis due to other bacteria, right ankle and foot: Secondary | ICD-10-CM | POA: Diagnosis not present

## 2018-01-25 DIAGNOSIS — C009 Malignant neoplasm of lip, unspecified: Secondary | ICD-10-CM | POA: Diagnosis not present

## 2018-01-25 DIAGNOSIS — R279 Unspecified lack of coordination: Secondary | ICD-10-CM | POA: Diagnosis not present

## 2018-01-25 DIAGNOSIS — M86171 Other acute osteomyelitis, right ankle and foot: Secondary | ICD-10-CM | POA: Diagnosis not present

## 2018-01-25 DIAGNOSIS — Z792 Long term (current) use of antibiotics: Secondary | ICD-10-CM | POA: Diagnosis not present

## 2018-01-25 DIAGNOSIS — I4891 Unspecified atrial fibrillation: Secondary | ICD-10-CM | POA: Diagnosis not present

## 2018-01-25 DIAGNOSIS — Z510 Encounter for antineoplastic radiation therapy: Secondary | ICD-10-CM | POA: Diagnosis not present

## 2018-01-25 DIAGNOSIS — K863 Pseudocyst of pancreas: Secondary | ICD-10-CM | POA: Diagnosis not present

## 2018-01-25 DIAGNOSIS — G8929 Other chronic pain: Secondary | ICD-10-CM | POA: Diagnosis not present

## 2018-01-25 DIAGNOSIS — Z79899 Other long term (current) drug therapy: Secondary | ICD-10-CM | POA: Diagnosis not present

## 2018-01-25 DIAGNOSIS — K861 Other chronic pancreatitis: Secondary | ICD-10-CM | POA: Diagnosis not present

## 2018-01-25 DIAGNOSIS — Z89421 Acquired absence of other right toe(s): Secondary | ICD-10-CM | POA: Diagnosis not present

## 2018-01-25 DIAGNOSIS — Z4822 Encounter for aftercare following kidney transplant: Secondary | ICD-10-CM | POA: Diagnosis not present

## 2018-01-25 DIAGNOSIS — D508 Other iron deficiency anemias: Secondary | ICD-10-CM | POA: Diagnosis not present

## 2018-01-25 DIAGNOSIS — B965 Pseudomonas (aeruginosa) (mallei) (pseudomallei) as the cause of diseases classified elsewhere: Secondary | ICD-10-CM | POA: Diagnosis not present

## 2018-01-25 DIAGNOSIS — I480 Paroxysmal atrial fibrillation: Secondary | ICD-10-CM | POA: Diagnosis not present

## 2018-01-25 DIAGNOSIS — Z452 Encounter for adjustment and management of vascular access device: Secondary | ICD-10-CM | POA: Diagnosis not present

## 2018-01-25 DIAGNOSIS — Z7901 Long term (current) use of anticoagulants: Secondary | ICD-10-CM | POA: Diagnosis not present

## 2018-01-25 DIAGNOSIS — S92201S Fracture of unspecified tarsal bone(s) of right foot, sequela: Secondary | ICD-10-CM | POA: Diagnosis not present

## 2018-01-25 DIAGNOSIS — D61818 Other pancytopenia: Secondary | ICD-10-CM | POA: Diagnosis not present

## 2018-01-25 DIAGNOSIS — Z7409 Other reduced mobility: Secondary | ICD-10-CM | POA: Diagnosis not present

## 2018-01-25 DIAGNOSIS — K8689 Other specified diseases of pancreas: Secondary | ICD-10-CM | POA: Diagnosis not present

## 2018-01-25 DIAGNOSIS — Z940 Kidney transplant status: Secondary | ICD-10-CM | POA: Diagnosis not present

## 2018-01-25 DIAGNOSIS — N184 Chronic kidney disease, stage 4 (severe): Secondary | ICD-10-CM | POA: Diagnosis not present

## 2018-01-25 DIAGNOSIS — C419 Malignant neoplasm of bone and articular cartilage, unspecified: Secondary | ICD-10-CM | POA: Diagnosis not present

## 2018-01-25 DIAGNOSIS — Z7952 Long term (current) use of systemic steroids: Secondary | ICD-10-CM | POA: Diagnosis not present

## 2018-01-25 DIAGNOSIS — E1142 Type 2 diabetes mellitus with diabetic polyneuropathy: Secondary | ICD-10-CM | POA: Diagnosis not present

## 2018-01-25 DIAGNOSIS — M8610 Other acute osteomyelitis, unspecified site: Secondary | ICD-10-CM | POA: Diagnosis not present

## 2018-01-25 DIAGNOSIS — D899 Disorder involving the immune mechanism, unspecified: Secondary | ICD-10-CM | POA: Diagnosis not present

## 2018-01-25 DIAGNOSIS — E872 Acidosis: Secondary | ICD-10-CM | POA: Diagnosis not present

## 2018-01-25 DIAGNOSIS — E119 Type 2 diabetes mellitus without complications: Secondary | ICD-10-CM | POA: Diagnosis not present

## 2018-01-25 DIAGNOSIS — C490 Malignant neoplasm of connective and soft tissue of head, face and neck: Secondary | ICD-10-CM | POA: Diagnosis not present

## 2018-01-25 DIAGNOSIS — C4440 Unspecified malignant neoplasm of skin of scalp and neck: Secondary | ICD-10-CM | POA: Diagnosis not present

## 2018-01-25 DIAGNOSIS — Z09 Encounter for follow-up examination after completed treatment for conditions other than malignant neoplasm: Secondary | ICD-10-CM | POA: Diagnosis not present

## 2018-01-25 DIAGNOSIS — I10 Essential (primary) hypertension: Secondary | ICD-10-CM | POA: Diagnosis not present

## 2018-01-25 DIAGNOSIS — G5632 Lesion of radial nerve, left upper limb: Secondary | ICD-10-CM | POA: Diagnosis not present

## 2018-01-25 DIAGNOSIS — E43 Unspecified severe protein-calorie malnutrition: Secondary | ICD-10-CM | POA: Diagnosis not present

## 2018-01-25 DIAGNOSIS — I5032 Chronic diastolic (congestive) heart failure: Secondary | ICD-10-CM | POA: Diagnosis not present

## 2018-01-25 DIAGNOSIS — R748 Abnormal levels of other serum enzymes: Secondary | ICD-10-CM | POA: Diagnosis not present

## 2018-01-25 DIAGNOSIS — D735 Infarction of spleen: Secondary | ICD-10-CM | POA: Diagnosis not present

## 2018-01-25 DIAGNOSIS — M868X0 Other osteomyelitis, multiple sites: Secondary | ICD-10-CM | POA: Diagnosis not present

## 2018-01-25 DIAGNOSIS — M25571 Pain in right ankle and joints of right foot: Secondary | ICD-10-CM | POA: Diagnosis not present

## 2018-01-25 DIAGNOSIS — R54 Age-related physical debility: Secondary | ICD-10-CM | POA: Diagnosis not present

## 2018-01-25 DIAGNOSIS — R531 Weakness: Secondary | ICD-10-CM | POA: Diagnosis not present

## 2018-01-25 DIAGNOSIS — M868X7 Other osteomyelitis, ankle and foot: Secondary | ICD-10-CM | POA: Diagnosis not present

## 2018-01-25 DIAGNOSIS — Z9889 Other specified postprocedural states: Secondary | ICD-10-CM | POA: Diagnosis not present

## 2018-01-25 DIAGNOSIS — I864 Gastric varices: Secondary | ICD-10-CM | POA: Diagnosis not present

## 2018-01-25 DIAGNOSIS — M25461 Effusion, right knee: Secondary | ICD-10-CM | POA: Diagnosis not present

## 2018-01-25 DIAGNOSIS — K529 Noninfective gastroenteritis and colitis, unspecified: Secondary | ICD-10-CM | POA: Diagnosis not present

## 2018-01-25 DIAGNOSIS — E1140 Type 2 diabetes mellitus with diabetic neuropathy, unspecified: Secondary | ICD-10-CM | POA: Diagnosis not present

## 2018-01-25 DIAGNOSIS — L308 Other specified dermatitis: Secondary | ICD-10-CM | POA: Diagnosis not present

## 2018-01-25 DIAGNOSIS — I129 Hypertensive chronic kidney disease with stage 1 through stage 4 chronic kidney disease, or unspecified chronic kidney disease: Secondary | ICD-10-CM | POA: Diagnosis not present

## 2018-01-25 DIAGNOSIS — I771 Stricture of artery: Secondary | ICD-10-CM | POA: Diagnosis not present

## 2018-01-25 DIAGNOSIS — E875 Hyperkalemia: Secondary | ICD-10-CM | POA: Diagnosis not present

## 2018-01-25 DIAGNOSIS — R5381 Other malaise: Secondary | ICD-10-CM | POA: Diagnosis not present

## 2018-01-25 DIAGNOSIS — D848 Other specified immunodeficiencies: Secondary | ICD-10-CM | POA: Diagnosis not present

## 2018-01-25 DIAGNOSIS — I482 Chronic atrial fibrillation, unspecified: Secondary | ICD-10-CM | POA: Diagnosis not present

## 2018-01-25 DIAGNOSIS — D509 Iron deficiency anemia, unspecified: Secondary | ICD-10-CM | POA: Diagnosis not present

## 2018-01-25 MED ORDER — POLYETHYLENE GLYCOL 3350 17 G PO PACK
17.00 | PACK | ORAL | Status: DC
Start: ? — End: 2018-01-25

## 2018-01-25 MED ORDER — GENERIC EXTERNAL MEDICATION
1.00 | Status: DC
Start: 2018-01-26 — End: 2018-01-25

## 2018-01-25 MED ORDER — FINASTERIDE 5 MG PO TABS
5.00 | ORAL_TABLET | ORAL | Status: DC
Start: 2018-01-25 — End: 2018-01-25

## 2018-01-25 MED ORDER — PANCRELIPASE (LIP-PROT-AMYL) 24000-76000 UNITS PO CPEP
24000.00 | ORAL_CAPSULE | ORAL | Status: DC
Start: ? — End: 2018-01-25

## 2018-01-25 MED ORDER — APIXABAN 2.5 MG PO TABS
2.50 | ORAL_TABLET | ORAL | Status: DC
Start: 2018-01-25 — End: 2018-01-25

## 2018-01-25 MED ORDER — GENERIC EXTERNAL MEDICATION
2.50 | Status: DC
Start: 2018-01-25 — End: 2018-01-25

## 2018-01-25 MED ORDER — DIPHENOXYLATE-ATROPINE 2.5-0.025 MG PO TABS
2.00 | ORAL_TABLET | ORAL | Status: DC
Start: 2018-01-25 — End: 2018-01-25

## 2018-01-25 MED ORDER — NEPHRO-VITE 0.8 MG PO TABS
1.00 | ORAL_TABLET | ORAL | Status: DC
Start: 2018-01-26 — End: 2018-01-25

## 2018-01-25 MED ORDER — GABAPENTIN 300 MG PO CAPS
300.00 | ORAL_CAPSULE | ORAL | Status: DC
Start: 2018-01-25 — End: 2018-01-25

## 2018-01-25 MED ORDER — DEXTROSE 10 % IV SOLN
125.00 | INTRAVENOUS | Status: DC
Start: ? — End: 2018-01-25

## 2018-01-25 MED ORDER — SENNOSIDES-DOCUSATE SODIUM 8.6-50 MG PO TABS
1.00 | ORAL_TABLET | ORAL | Status: DC
Start: ? — End: 2018-01-25

## 2018-01-25 MED ORDER — ACETAMINOPHEN 325 MG PO TABS
650.00 | ORAL_TABLET | ORAL | Status: DC
Start: ? — End: 2018-01-25

## 2018-01-25 MED ORDER — METOPROLOL TARTRATE 50 MG PO TABS
100.00 | ORAL_TABLET | ORAL | Status: DC
Start: 2018-01-25 — End: 2018-01-25

## 2018-01-25 MED ORDER — PANCRELIPASE (LIP-PROT-AMYL) 24000-76000 UNITS PO CPEP
72000.00 | ORAL_CAPSULE | ORAL | Status: DC
Start: 2018-01-26 — End: 2018-01-25

## 2018-01-25 MED ORDER — CHOLESTYRAMINE LIGHT 4 G PO PACK
4.00 | PACK | ORAL | Status: DC
Start: 2018-01-25 — End: 2018-01-25

## 2018-01-25 MED ORDER — GENERIC EXTERNAL MEDICATION
625.00 | Status: DC
Start: 2018-01-25 — End: 2018-01-25

## 2018-01-25 MED ORDER — PREDNISONE 5 MG PO TABS
5.00 | ORAL_TABLET | ORAL | Status: DC
Start: 2018-01-26 — End: 2018-01-25

## 2018-01-25 MED ORDER — SODIUM BICARBONATE 650 MG PO TABS
650.00 | ORAL_TABLET | ORAL | Status: DC
Start: 2018-01-25 — End: 2018-01-25

## 2018-01-25 MED ORDER — PANTOPRAZOLE SODIUM 40 MG PO TBEC
40.00 | DELAYED_RELEASE_TABLET | ORAL | Status: DC
Start: 2018-01-26 — End: 2018-01-25

## 2018-01-25 MED ORDER — GENERIC EXTERNAL MEDICATION
80.00 | Status: DC
Start: 2018-01-25 — End: 2018-01-25

## 2018-01-25 MED ORDER — K-PHOS-NEUTRAL 155-852-130 MG PO TABS
500.00 | ORAL_TABLET | ORAL | Status: DC
Start: 2018-01-25 — End: 2018-01-25

## 2018-01-25 MED ORDER — TACROLIMUS 1 MG PO CAPS
3.00 | ORAL_CAPSULE | ORAL | Status: DC
Start: 2018-01-26 — End: 2018-01-25

## 2018-01-25 MED ORDER — HYDROCODONE-ACETAMINOPHEN 5-325 MG PO TABS
1.00 | ORAL_TABLET | ORAL | Status: DC
Start: ? — End: 2018-01-25

## 2018-01-25 MED ORDER — INSULIN LISPRO 100 UNIT/ML ~~LOC~~ SOLN
1.00 | SUBCUTANEOUS | Status: DC
Start: 2018-01-26 — End: 2018-01-25

## 2018-01-26 DIAGNOSIS — C419 Malignant neoplasm of bone and articular cartilage, unspecified: Secondary | ICD-10-CM | POA: Diagnosis not present

## 2018-01-26 DIAGNOSIS — Z940 Kidney transplant status: Secondary | ICD-10-CM | POA: Diagnosis not present

## 2018-01-26 DIAGNOSIS — A498 Other bacterial infections of unspecified site: Secondary | ICD-10-CM | POA: Diagnosis not present

## 2018-01-26 DIAGNOSIS — M8610 Other acute osteomyelitis, unspecified site: Secondary | ICD-10-CM | POA: Diagnosis not present

## 2018-01-31 DIAGNOSIS — N184 Chronic kidney disease, stage 4 (severe): Secondary | ICD-10-CM | POA: Diagnosis not present

## 2018-01-31 DIAGNOSIS — I480 Paroxysmal atrial fibrillation: Secondary | ICD-10-CM | POA: Diagnosis not present

## 2018-01-31 DIAGNOSIS — K8689 Other specified diseases of pancreas: Secondary | ICD-10-CM | POA: Diagnosis not present

## 2018-01-31 DIAGNOSIS — M8610 Other acute osteomyelitis, unspecified site: Secondary | ICD-10-CM | POA: Diagnosis not present

## 2018-02-01 DIAGNOSIS — Z8619 Personal history of other infectious and parasitic diseases: Secondary | ICD-10-CM | POA: Diagnosis not present

## 2018-02-01 DIAGNOSIS — Z89421 Acquired absence of other right toe(s): Secondary | ICD-10-CM | POA: Diagnosis not present

## 2018-02-01 DIAGNOSIS — E1142 Type 2 diabetes mellitus with diabetic polyneuropathy: Secondary | ICD-10-CM | POA: Diagnosis not present

## 2018-02-01 DIAGNOSIS — I2510 Atherosclerotic heart disease of native coronary artery without angina pectoris: Secondary | ICD-10-CM | POA: Diagnosis not present

## 2018-02-01 DIAGNOSIS — C490 Malignant neoplasm of connective and soft tissue of head, face and neck: Secondary | ICD-10-CM | POA: Diagnosis not present

## 2018-02-01 DIAGNOSIS — Z4822 Encounter for aftercare following kidney transplant: Secondary | ICD-10-CM | POA: Diagnosis not present

## 2018-02-03 DIAGNOSIS — M86271 Subacute osteomyelitis, right ankle and foot: Secondary | ICD-10-CM | POA: Diagnosis not present

## 2018-02-07 DIAGNOSIS — Z510 Encounter for antineoplastic radiation therapy: Secondary | ICD-10-CM | POA: Diagnosis not present

## 2018-02-07 DIAGNOSIS — C490 Malignant neoplasm of connective and soft tissue of head, face and neck: Secondary | ICD-10-CM | POA: Diagnosis not present

## 2018-02-07 NOTE — Telephone Encounter (Signed)
Shala from orders recovery  from [place] ext [phone] calling to see if plan of care that was faxed over on the 15th wanted to know if the form  has been signed and faxed back [phone]  She also calling to see about the request for OT  Once a week for 1 week    twice a  week for 2 weeks and    PT 2 times a week for 4 weeks plan of care   Shala will refax form today

## 2018-02-08 ENCOUNTER — Telehealth: Payer: Self-pay | Admitting: *Deleted

## 2018-02-08 NOTE — Telephone Encounter (Signed)
Received Home Health Certification and Plan of Care; forwarded to provider/SLS 10/22  Received Physician Orders from [place]; forwarded to provider/SLS 10/22

## 2018-02-09 DIAGNOSIS — Z510 Encounter for antineoplastic radiation therapy: Secondary | ICD-10-CM | POA: Diagnosis not present

## 2018-02-09 DIAGNOSIS — C490 Malignant neoplasm of connective and soft tissue of head, face and neck: Secondary | ICD-10-CM | POA: Diagnosis not present

## 2018-02-10 DIAGNOSIS — Z510 Encounter for antineoplastic radiation therapy: Secondary | ICD-10-CM | POA: Diagnosis not present

## 2018-02-10 DIAGNOSIS — C490 Malignant neoplasm of connective and soft tissue of head, face and neck: Secondary | ICD-10-CM | POA: Diagnosis not present

## 2018-02-11 DIAGNOSIS — C490 Malignant neoplasm of connective and soft tissue of head, face and neck: Secondary | ICD-10-CM | POA: Diagnosis not present

## 2018-02-11 DIAGNOSIS — Z510 Encounter for antineoplastic radiation therapy: Secondary | ICD-10-CM | POA: Diagnosis not present

## 2018-02-14 DIAGNOSIS — Z510 Encounter for antineoplastic radiation therapy: Secondary | ICD-10-CM | POA: Diagnosis not present

## 2018-02-14 DIAGNOSIS — C490 Malignant neoplasm of connective and soft tissue of head, face and neck: Secondary | ICD-10-CM | POA: Diagnosis not present

## 2018-02-15 DIAGNOSIS — Z510 Encounter for antineoplastic radiation therapy: Secondary | ICD-10-CM | POA: Diagnosis not present

## 2018-02-15 DIAGNOSIS — C490 Malignant neoplasm of connective and soft tissue of head, face and neck: Secondary | ICD-10-CM | POA: Diagnosis not present

## 2018-02-16 DIAGNOSIS — M86171 Other acute osteomyelitis, right ankle and foot: Secondary | ICD-10-CM | POA: Diagnosis not present

## 2018-02-16 DIAGNOSIS — A498 Other bacterial infections of unspecified site: Secondary | ICD-10-CM | POA: Diagnosis not present

## 2018-02-16 DIAGNOSIS — Z792 Long term (current) use of antibiotics: Secondary | ICD-10-CM | POA: Diagnosis not present

## 2018-02-16 DIAGNOSIS — Z940 Kidney transplant status: Secondary | ICD-10-CM | POA: Diagnosis not present

## 2018-02-16 DIAGNOSIS — C490 Malignant neoplasm of connective and soft tissue of head, face and neck: Secondary | ICD-10-CM | POA: Diagnosis not present

## 2018-02-16 DIAGNOSIS — N183 Chronic kidney disease, stage 3 (moderate): Secondary | ICD-10-CM | POA: Diagnosis not present

## 2018-02-16 DIAGNOSIS — D61818 Other pancytopenia: Secondary | ICD-10-CM | POA: Diagnosis not present

## 2018-02-16 DIAGNOSIS — R748 Abnormal levels of other serum enzymes: Secondary | ICD-10-CM | POA: Diagnosis not present

## 2018-02-16 DIAGNOSIS — Z09 Encounter for follow-up examination after completed treatment for conditions other than malignant neoplasm: Secondary | ICD-10-CM | POA: Diagnosis not present

## 2018-02-16 DIAGNOSIS — K529 Noninfective gastroenteritis and colitis, unspecified: Secondary | ICD-10-CM | POA: Diagnosis not present

## 2018-02-23 DIAGNOSIS — C4449 Other specified malignant neoplasm of skin of scalp and neck: Secondary | ICD-10-CM | POA: Diagnosis not present

## 2018-02-23 DIAGNOSIS — Z79899 Other long term (current) drug therapy: Secondary | ICD-10-CM | POA: Diagnosis not present

## 2018-02-24 DIAGNOSIS — Z452 Encounter for adjustment and management of vascular access device: Secondary | ICD-10-CM | POA: Diagnosis not present

## 2018-02-24 DIAGNOSIS — M868X7 Other osteomyelitis, ankle and foot: Secondary | ICD-10-CM | POA: Diagnosis not present

## 2018-02-25 DIAGNOSIS — I4891 Unspecified atrial fibrillation: Secondary | ICD-10-CM | POA: Diagnosis not present

## 2018-02-25 DIAGNOSIS — I10 Essential (primary) hypertension: Secondary | ICD-10-CM | POA: Diagnosis not present

## 2018-02-25 DIAGNOSIS — Z4822 Encounter for aftercare following kidney transplant: Secondary | ICD-10-CM | POA: Diagnosis not present

## 2018-02-25 DIAGNOSIS — Z940 Kidney transplant status: Secondary | ICD-10-CM | POA: Diagnosis not present

## 2018-02-25 DIAGNOSIS — E875 Hyperkalemia: Secondary | ICD-10-CM | POA: Diagnosis not present

## 2018-02-25 DIAGNOSIS — R809 Proteinuria, unspecified: Secondary | ICD-10-CM | POA: Diagnosis not present

## 2018-02-25 DIAGNOSIS — E872 Acidosis: Secondary | ICD-10-CM | POA: Diagnosis not present

## 2018-02-28 DIAGNOSIS — C490 Malignant neoplasm of connective and soft tissue of head, face and neck: Secondary | ICD-10-CM | POA: Diagnosis not present

## 2018-02-28 DIAGNOSIS — R64 Cachexia: Secondary | ICD-10-CM | POA: Diagnosis not present

## 2018-02-28 DIAGNOSIS — M25461 Effusion, right knee: Secondary | ICD-10-CM | POA: Diagnosis not present

## 2018-02-28 DIAGNOSIS — Z7409 Other reduced mobility: Secondary | ICD-10-CM | POA: Diagnosis not present

## 2018-02-28 DIAGNOSIS — Z510 Encounter for antineoplastic radiation therapy: Secondary | ICD-10-CM | POA: Diagnosis not present

## 2018-02-28 DIAGNOSIS — E43 Unspecified severe protein-calorie malnutrition: Secondary | ICD-10-CM | POA: Diagnosis not present

## 2018-02-28 DIAGNOSIS — S8991XA Unspecified injury of right lower leg, initial encounter: Secondary | ICD-10-CM | POA: Diagnosis not present

## 2018-02-28 DIAGNOSIS — R54 Age-related physical debility: Secondary | ICD-10-CM | POA: Diagnosis not present

## 2018-03-01 DIAGNOSIS — C490 Malignant neoplasm of connective and soft tissue of head, face and neck: Secondary | ICD-10-CM | POA: Diagnosis not present

## 2018-03-01 DIAGNOSIS — Z510 Encounter for antineoplastic radiation therapy: Secondary | ICD-10-CM | POA: Diagnosis not present

## 2018-03-02 DIAGNOSIS — Z510 Encounter for antineoplastic radiation therapy: Secondary | ICD-10-CM | POA: Diagnosis not present

## 2018-03-02 DIAGNOSIS — C490 Malignant neoplasm of connective and soft tissue of head, face and neck: Secondary | ICD-10-CM | POA: Diagnosis not present

## 2018-03-03 DIAGNOSIS — C490 Malignant neoplasm of connective and soft tissue of head, face and neck: Secondary | ICD-10-CM | POA: Diagnosis not present

## 2018-03-03 DIAGNOSIS — Z510 Encounter for antineoplastic radiation therapy: Secondary | ICD-10-CM | POA: Diagnosis not present

## 2018-03-04 DIAGNOSIS — L308 Other specified dermatitis: Secondary | ICD-10-CM | POA: Diagnosis not present

## 2018-03-04 DIAGNOSIS — Z510 Encounter for antineoplastic radiation therapy: Secondary | ICD-10-CM | POA: Diagnosis not present

## 2018-03-04 DIAGNOSIS — C490 Malignant neoplasm of connective and soft tissue of head, face and neck: Secondary | ICD-10-CM | POA: Diagnosis not present

## 2018-03-08 DIAGNOSIS — I480 Paroxysmal atrial fibrillation: Secondary | ICD-10-CM | POA: Diagnosis not present

## 2018-03-08 DIAGNOSIS — C490 Malignant neoplasm of connective and soft tissue of head, face and neck: Secondary | ICD-10-CM | POA: Diagnosis not present

## 2018-03-08 DIAGNOSIS — D508 Other iron deficiency anemias: Secondary | ICD-10-CM | POA: Diagnosis not present

## 2018-03-08 DIAGNOSIS — G5632 Lesion of radial nerve, left upper limb: Secondary | ICD-10-CM | POA: Diagnosis not present

## 2018-03-09 DIAGNOSIS — R5381 Other malaise: Secondary | ICD-10-CM | POA: Diagnosis not present

## 2018-03-09 DIAGNOSIS — I480 Paroxysmal atrial fibrillation: Secondary | ICD-10-CM | POA: Diagnosis not present

## 2018-03-09 DIAGNOSIS — C419 Malignant neoplasm of bone and articular cartilage, unspecified: Secondary | ICD-10-CM | POA: Diagnosis not present

## 2018-03-09 DIAGNOSIS — I5032 Chronic diastolic (congestive) heart failure: Secondary | ICD-10-CM | POA: Diagnosis not present

## 2018-03-10 ENCOUNTER — Telehealth: Payer: Self-pay

## 2018-03-10 NOTE — Telephone Encounter (Signed)
Verbal orders given to Matthew Rowland at [facility].

## 2018-03-10 NOTE — Telephone Encounter (Signed)
Copied from [place] [phone]. Topic: General - Other >> Mar 10, 2018 10:48 AM Ivar Drape wrote: Reason for CRM:   Lavella Lemons w/[facility] [phone] needs a verbal order that Dr. Lamar Blinks will follow the care for nursing, pt, ot and a social worker for the patient.

## 2018-03-11 DIAGNOSIS — K8681 Exocrine pancreatic insufficiency: Secondary | ICD-10-CM | POA: Diagnosis not present

## 2018-03-11 DIAGNOSIS — E1122 Type 2 diabetes mellitus with diabetic chronic kidney disease: Secondary | ICD-10-CM | POA: Diagnosis not present

## 2018-03-11 DIAGNOSIS — E43 Unspecified severe protein-calorie malnutrition: Secondary | ICD-10-CM | POA: Diagnosis not present

## 2018-03-11 DIAGNOSIS — I5020 Unspecified systolic (congestive) heart failure: Secondary | ICD-10-CM | POA: Diagnosis not present

## 2018-03-11 DIAGNOSIS — M6250 Muscle wasting and atrophy, not elsewhere classified, unspecified site: Secondary | ICD-10-CM | POA: Diagnosis not present

## 2018-03-11 DIAGNOSIS — I2510 Atherosclerotic heart disease of native coronary artery without angina pectoris: Secondary | ICD-10-CM | POA: Diagnosis not present

## 2018-03-11 DIAGNOSIS — N183 Chronic kidney disease, stage 3 (moderate): Secondary | ICD-10-CM | POA: Diagnosis not present

## 2018-03-11 DIAGNOSIS — M25461 Effusion, right knee: Secondary | ICD-10-CM | POA: Diagnosis not present

## 2018-03-11 DIAGNOSIS — C490 Malignant neoplasm of connective and soft tissue of head, face and neck: Secondary | ICD-10-CM | POA: Diagnosis not present

## 2018-03-11 DIAGNOSIS — W19XXXS Unspecified fall, sequela: Secondary | ICD-10-CM | POA: Diagnosis not present

## 2018-03-14 DIAGNOSIS — N183 Chronic kidney disease, stage 3 (moderate): Secondary | ICD-10-CM | POA: Diagnosis not present

## 2018-03-14 DIAGNOSIS — M6250 Muscle wasting and atrophy, not elsewhere classified, unspecified site: Secondary | ICD-10-CM | POA: Diagnosis not present

## 2018-03-14 DIAGNOSIS — E1122 Type 2 diabetes mellitus with diabetic chronic kidney disease: Secondary | ICD-10-CM | POA: Diagnosis not present

## 2018-03-14 DIAGNOSIS — K8681 Exocrine pancreatic insufficiency: Secondary | ICD-10-CM | POA: Diagnosis not present

## 2018-03-14 DIAGNOSIS — E43 Unspecified severe protein-calorie malnutrition: Secondary | ICD-10-CM | POA: Diagnosis not present

## 2018-03-14 DIAGNOSIS — C490 Malignant neoplasm of connective and soft tissue of head, face and neck: Secondary | ICD-10-CM | POA: Diagnosis not present

## 2018-03-15 ENCOUNTER — Telehealth: Payer: Self-pay | Admitting: Family Medicine

## 2018-03-15 DIAGNOSIS — Z79899 Other long term (current) drug therapy: Secondary | ICD-10-CM | POA: Diagnosis not present

## 2018-03-15 DIAGNOSIS — Z4822 Encounter for aftercare following kidney transplant: Secondary | ICD-10-CM | POA: Diagnosis not present

## 2018-03-15 NOTE — Telephone Encounter (Signed)
Copied from [place] [phone]. Topic: Quick Communication - See Telephone Encounter >> Mar 15, 2018  1:58 PM Bea Graff, NT wrote: CRM for notification. See Telephone encounter for: 03/15/18. Lennette Bihari, Clinical Manager with [facility] calling to report a delayed OT eval for this pt. Been delayed to next Wednesday due to Thanksgiving. CB#: [phone]

## 2018-03-16 DIAGNOSIS — E43 Unspecified severe protein-calorie malnutrition: Secondary | ICD-10-CM | POA: Diagnosis not present

## 2018-03-16 DIAGNOSIS — M6250 Muscle wasting and atrophy, not elsewhere classified, unspecified site: Secondary | ICD-10-CM | POA: Diagnosis not present

## 2018-03-16 DIAGNOSIS — N183 Chronic kidney disease, stage 3 (moderate): Secondary | ICD-10-CM | POA: Diagnosis not present

## 2018-03-16 DIAGNOSIS — E1122 Type 2 diabetes mellitus with diabetic chronic kidney disease: Secondary | ICD-10-CM | POA: Diagnosis not present

## 2018-03-16 DIAGNOSIS — C490 Malignant neoplasm of connective and soft tissue of head, face and neck: Secondary | ICD-10-CM | POA: Diagnosis not present

## 2018-03-16 DIAGNOSIS — K8681 Exocrine pancreatic insufficiency: Secondary | ICD-10-CM | POA: Diagnosis not present

## 2018-03-18 DIAGNOSIS — K8681 Exocrine pancreatic insufficiency: Secondary | ICD-10-CM | POA: Diagnosis not present

## 2018-03-18 DIAGNOSIS — C490 Malignant neoplasm of connective and soft tissue of head, face and neck: Secondary | ICD-10-CM | POA: Diagnosis not present

## 2018-03-18 DIAGNOSIS — E1122 Type 2 diabetes mellitus with diabetic chronic kidney disease: Secondary | ICD-10-CM | POA: Diagnosis not present

## 2018-03-18 DIAGNOSIS — M6250 Muscle wasting and atrophy, not elsewhere classified, unspecified site: Secondary | ICD-10-CM | POA: Diagnosis not present

## 2018-03-18 DIAGNOSIS — N183 Chronic kidney disease, stage 3 (moderate): Secondary | ICD-10-CM | POA: Diagnosis not present

## 2018-03-18 DIAGNOSIS — E43 Unspecified severe protein-calorie malnutrition: Secondary | ICD-10-CM | POA: Diagnosis not present

## 2018-03-21 DIAGNOSIS — N183 Chronic kidney disease, stage 3 (moderate): Secondary | ICD-10-CM | POA: Diagnosis not present

## 2018-03-21 DIAGNOSIS — E1122 Type 2 diabetes mellitus with diabetic chronic kidney disease: Secondary | ICD-10-CM | POA: Diagnosis not present

## 2018-03-21 DIAGNOSIS — C490 Malignant neoplasm of connective and soft tissue of head, face and neck: Secondary | ICD-10-CM | POA: Diagnosis not present

## 2018-03-21 DIAGNOSIS — K8681 Exocrine pancreatic insufficiency: Secondary | ICD-10-CM | POA: Diagnosis not present

## 2018-03-21 DIAGNOSIS — E43 Unspecified severe protein-calorie malnutrition: Secondary | ICD-10-CM | POA: Diagnosis not present

## 2018-03-21 DIAGNOSIS — M6250 Muscle wasting and atrophy, not elsewhere classified, unspecified site: Secondary | ICD-10-CM | POA: Diagnosis not present

## 2018-03-22 ENCOUNTER — Telehealth: Payer: Self-pay | Admitting: Family Medicine

## 2018-03-22 DIAGNOSIS — C490 Malignant neoplasm of connective and soft tissue of head, face and neck: Secondary | ICD-10-CM | POA: Diagnosis not present

## 2018-03-22 DIAGNOSIS — K8681 Exocrine pancreatic insufficiency: Secondary | ICD-10-CM | POA: Diagnosis not present

## 2018-03-22 DIAGNOSIS — N183 Chronic kidney disease, stage 3 (moderate): Secondary | ICD-10-CM | POA: Diagnosis not present

## 2018-03-22 DIAGNOSIS — E1122 Type 2 diabetes mellitus with diabetic chronic kidney disease: Secondary | ICD-10-CM | POA: Diagnosis not present

## 2018-03-22 DIAGNOSIS — E43 Unspecified severe protein-calorie malnutrition: Secondary | ICD-10-CM | POA: Diagnosis not present

## 2018-03-22 DIAGNOSIS — M6250 Muscle wasting and atrophy, not elsewhere classified, unspecified site: Secondary | ICD-10-CM | POA: Diagnosis not present

## 2018-03-22 NOTE — Telephone Encounter (Signed)
Spoke w/ Radovan- verbal orders given.  

## 2018-03-22 NOTE — Telephone Encounter (Signed)
Copied from [place] [phone]. Topic: Quick Communication - See Telephone Encounter >> Mar 22, 2018  4:01 PM Bea Graff, NT wrote: CRM for notification. See Telephone encounter for: 03/22/18. Wells Guiles with [facility] calling to report pt stated he fell on Sunday. He hurt his forearm on door frame and complaining of pain of his left knee. Pt was not seen at ER or UC. CB#: [phone]

## 2018-03-22 NOTE — Telephone Encounter (Signed)
FYI

## 2018-03-22 NOTE — Telephone Encounter (Signed)
Copied from [place] [id number]. Topic: Quick Communication - See T elephone Encounter >> Mar 22, 2018  1:16 PM Antonieta Iba C wrote: CRM for notification. See Telephone encounter for: 03/22/18.   Radovan- OT with [facility] [phone]    Requesting vo for ot   Frequency: 1 week 1  2 week 4- all starting this week

## 2018-03-23 NOTE — Telephone Encounter (Signed)
Called to check on him-  He is ok from his fall

## 2018-03-24 ENCOUNTER — Telehealth: Payer: Self-pay | Admitting: *Deleted

## 2018-03-24 DIAGNOSIS — E1122 Type 2 diabetes mellitus with diabetic chronic kidney disease: Secondary | ICD-10-CM | POA: Diagnosis not present

## 2018-03-24 DIAGNOSIS — M6250 Muscle wasting and atrophy, not elsewhere classified, unspecified site: Secondary | ICD-10-CM | POA: Diagnosis not present

## 2018-03-24 DIAGNOSIS — K8681 Exocrine pancreatic insufficiency: Secondary | ICD-10-CM | POA: Diagnosis not present

## 2018-03-24 DIAGNOSIS — N183 Chronic kidney disease, stage 3 (moderate): Secondary | ICD-10-CM | POA: Diagnosis not present

## 2018-03-24 DIAGNOSIS — E43 Unspecified severe protein-calorie malnutrition: Secondary | ICD-10-CM | POA: Diagnosis not present

## 2018-03-24 DIAGNOSIS — C490 Malignant neoplasm of connective and soft tissue of head, face and neck: Secondary | ICD-10-CM | POA: Diagnosis not present

## 2018-03-24 NOTE — Telephone Encounter (Signed)
Received Physician Orders from [place]; forwarded to provider/SLS 12/05  Received [place]; forwarded to provider/SLS 12/05

## 2018-03-25 ENCOUNTER — Telehealth: Payer: Self-pay | Admitting: *Deleted

## 2018-03-25 NOTE — Telephone Encounter (Signed)
Received Physician Orders from [place]; forwarded to provider/SLS 12/06

## 2018-03-28 ENCOUNTER — Telehealth: Payer: Self-pay | Admitting: Family Medicine

## 2018-03-28 NOTE — Telephone Encounter (Signed)
Done

## 2018-03-28 NOTE — Telephone Encounter (Signed)
Copied from [place] [phone]. Topic: Quick Communication - Home Health Verbal Orders >> Mar 28, 2018 12:37 PM Ivar Drape wrote: Caller/Agency: Jinny Blossom w/[facility] Number:  [phone] Requesting:  An order for a change in wound care.  They were doing hydrocolloid and they want to change that to Xeroform and a dry dressing.   Frequency:  twice weekly  The order can be left on the voicemail

## 2018-03-29 DIAGNOSIS — N183 Chronic kidney disease, stage 3 (moderate): Secondary | ICD-10-CM | POA: Diagnosis not present

## 2018-03-29 DIAGNOSIS — C490 Malignant neoplasm of connective and soft tissue of head, face and neck: Secondary | ICD-10-CM | POA: Diagnosis not present

## 2018-03-29 DIAGNOSIS — M6250 Muscle wasting and atrophy, not elsewhere classified, unspecified site: Secondary | ICD-10-CM | POA: Diagnosis not present

## 2018-03-29 DIAGNOSIS — K8681 Exocrine pancreatic insufficiency: Secondary | ICD-10-CM | POA: Diagnosis not present

## 2018-03-29 DIAGNOSIS — E43 Unspecified severe protein-calorie malnutrition: Secondary | ICD-10-CM | POA: Diagnosis not present

## 2018-03-29 DIAGNOSIS — E1122 Type 2 diabetes mellitus with diabetic chronic kidney disease: Secondary | ICD-10-CM | POA: Diagnosis not present

## 2018-03-30 DIAGNOSIS — K8681 Exocrine pancreatic insufficiency: Secondary | ICD-10-CM | POA: Diagnosis not present

## 2018-03-30 DIAGNOSIS — E43 Unspecified severe protein-calorie malnutrition: Secondary | ICD-10-CM | POA: Diagnosis not present

## 2018-03-30 DIAGNOSIS — M6250 Muscle wasting and atrophy, not elsewhere classified, unspecified site: Secondary | ICD-10-CM | POA: Diagnosis not present

## 2018-03-30 DIAGNOSIS — N183 Chronic kidney disease, stage 3 (moderate): Secondary | ICD-10-CM | POA: Diagnosis not present

## 2018-03-30 DIAGNOSIS — C490 Malignant neoplasm of connective and soft tissue of head, face and neck: Secondary | ICD-10-CM | POA: Diagnosis not present

## 2018-03-30 DIAGNOSIS — E1122 Type 2 diabetes mellitus with diabetic chronic kidney disease: Secondary | ICD-10-CM | POA: Diagnosis not present

## 2018-03-31 DIAGNOSIS — E43 Unspecified severe protein-calorie malnutrition: Secondary | ICD-10-CM | POA: Diagnosis not present

## 2018-03-31 DIAGNOSIS — N183 Chronic kidney disease, stage 3 (moderate): Secondary | ICD-10-CM | POA: Diagnosis not present

## 2018-03-31 DIAGNOSIS — E1122 Type 2 diabetes mellitus with diabetic chronic kidney disease: Secondary | ICD-10-CM | POA: Diagnosis not present

## 2018-03-31 DIAGNOSIS — C490 Malignant neoplasm of connective and soft tissue of head, face and neck: Secondary | ICD-10-CM | POA: Diagnosis not present

## 2018-03-31 DIAGNOSIS — K8681 Exocrine pancreatic insufficiency: Secondary | ICD-10-CM | POA: Diagnosis not present

## 2018-03-31 DIAGNOSIS — M6250 Muscle wasting and atrophy, not elsewhere classified, unspecified site: Secondary | ICD-10-CM | POA: Diagnosis not present

## 2018-04-01 DIAGNOSIS — E43 Unspecified severe protein-calorie malnutrition: Secondary | ICD-10-CM | POA: Diagnosis not present

## 2018-04-01 DIAGNOSIS — M6250 Muscle wasting and atrophy, not elsewhere classified, unspecified site: Secondary | ICD-10-CM | POA: Diagnosis not present

## 2018-04-01 DIAGNOSIS — E1122 Type 2 diabetes mellitus with diabetic chronic kidney disease: Secondary | ICD-10-CM | POA: Diagnosis not present

## 2018-04-01 DIAGNOSIS — C490 Malignant neoplasm of connective and soft tissue of head, face and neck: Secondary | ICD-10-CM | POA: Diagnosis not present

## 2018-04-01 DIAGNOSIS — K8681 Exocrine pancreatic insufficiency: Secondary | ICD-10-CM | POA: Diagnosis not present

## 2018-04-01 DIAGNOSIS — N183 Chronic kidney disease, stage 3 (moderate): Secondary | ICD-10-CM | POA: Diagnosis not present

## 2018-04-04 DIAGNOSIS — K8681 Exocrine pancreatic insufficiency: Secondary | ICD-10-CM | POA: Diagnosis not present

## 2018-04-04 DIAGNOSIS — E43 Unspecified severe protein-calorie malnutrition: Secondary | ICD-10-CM | POA: Diagnosis not present

## 2018-04-04 DIAGNOSIS — M6250 Muscle wasting and atrophy, not elsewhere classified, unspecified site: Secondary | ICD-10-CM | POA: Diagnosis not present

## 2018-04-04 DIAGNOSIS — N183 Chronic kidney disease, stage 3 (moderate): Secondary | ICD-10-CM | POA: Diagnosis not present

## 2018-04-04 DIAGNOSIS — E1122 Type 2 diabetes mellitus with diabetic chronic kidney disease: Secondary | ICD-10-CM | POA: Diagnosis not present

## 2018-04-04 DIAGNOSIS — C490 Malignant neoplasm of connective and soft tissue of head, face and neck: Secondary | ICD-10-CM | POA: Diagnosis not present

## 2018-04-05 ENCOUNTER — Telehealth: Payer: Self-pay | Admitting: Family Medicine

## 2018-04-05 DIAGNOSIS — E43 Unspecified severe protein-calorie malnutrition: Secondary | ICD-10-CM | POA: Diagnosis not present

## 2018-04-05 DIAGNOSIS — K8681 Exocrine pancreatic insufficiency: Secondary | ICD-10-CM | POA: Diagnosis not present

## 2018-04-05 DIAGNOSIS — N183 Chronic kidney disease, stage 3 (moderate): Secondary | ICD-10-CM | POA: Diagnosis not present

## 2018-04-05 DIAGNOSIS — C490 Malignant neoplasm of connective and soft tissue of head, face and neck: Secondary | ICD-10-CM | POA: Diagnosis not present

## 2018-04-05 DIAGNOSIS — E1122 Type 2 diabetes mellitus with diabetic chronic kidney disease: Secondary | ICD-10-CM | POA: Diagnosis not present

## 2018-04-05 DIAGNOSIS — M6250 Muscle wasting and atrophy, not elsewhere classified, unspecified site: Secondary | ICD-10-CM | POA: Diagnosis not present

## 2018-04-05 NOTE — Telephone Encounter (Signed)
Copied from [place] [id number]. Topic: Quick Communication - See Telephone Encounter >> Apr 05, 2018  2:38 PM Berneta Levins wrote: CRM for notification. See Telephone encounter for: 04/05/18.  Radovan with [facility] calling to state pt will have a missed OT appointment today.  Stated that he was not feeing well, dealing with vertigo. Radovan can be reached at [phone] if needed

## 2018-04-05 NOTE — Telephone Encounter (Signed)
Just an FYI

## 2018-04-06 DIAGNOSIS — Z940 Kidney transplant status: Secondary | ICD-10-CM | POA: Diagnosis not present

## 2018-04-06 DIAGNOSIS — D899 Disorder involving the immune mechanism, unspecified: Secondary | ICD-10-CM | POA: Diagnosis not present

## 2018-04-06 DIAGNOSIS — C770 Secondary and unspecified malignant neoplasm of lymph nodes of head, face and neck: Secondary | ICD-10-CM | POA: Diagnosis not present

## 2018-04-06 DIAGNOSIS — R918 Other nonspecific abnormal finding of lung field: Secondary | ICD-10-CM | POA: Diagnosis not present

## 2018-04-06 DIAGNOSIS — Z79899 Other long term (current) drug therapy: Secondary | ICD-10-CM | POA: Diagnosis not present

## 2018-04-06 DIAGNOSIS — J90 Pleural effusion, not elsewhere classified: Secondary | ICD-10-CM | POA: Diagnosis not present

## 2018-04-06 DIAGNOSIS — C490 Malignant neoplasm of connective and soft tissue of head, face and neck: Secondary | ICD-10-CM | POA: Diagnosis not present

## 2018-04-07 DIAGNOSIS — M6250 Muscle wasting and atrophy, not elsewhere classified, unspecified site: Secondary | ICD-10-CM | POA: Diagnosis not present

## 2018-04-07 DIAGNOSIS — E860 Dehydration: Secondary | ICD-10-CM | POA: Diagnosis not present

## 2018-04-07 DIAGNOSIS — I5022 Chronic systolic (congestive) heart failure: Secondary | ICD-10-CM | POA: Diagnosis not present

## 2018-04-07 DIAGNOSIS — E43 Unspecified severe protein-calorie malnutrition: Secondary | ICD-10-CM | POA: Diagnosis not present

## 2018-04-07 DIAGNOSIS — D61818 Other pancytopenia: Secondary | ICD-10-CM | POA: Diagnosis not present

## 2018-04-07 DIAGNOSIS — E1122 Type 2 diabetes mellitus with diabetic chronic kidney disease: Secondary | ICD-10-CM | POA: Diagnosis not present

## 2018-04-07 DIAGNOSIS — R64 Cachexia: Secondary | ICD-10-CM | POA: Diagnosis not present

## 2018-04-07 DIAGNOSIS — N183 Chronic kidney disease, stage 3 (moderate): Secondary | ICD-10-CM | POA: Diagnosis not present

## 2018-04-07 DIAGNOSIS — K8689 Other specified diseases of pancreas: Secondary | ICD-10-CM | POA: Diagnosis not present

## 2018-04-07 DIAGNOSIS — C490 Malignant neoplasm of connective and soft tissue of head, face and neck: Secondary | ICD-10-CM | POA: Diagnosis not present

## 2018-04-07 DIAGNOSIS — K8681 Exocrine pancreatic insufficiency: Secondary | ICD-10-CM | POA: Diagnosis not present

## 2018-04-07 DIAGNOSIS — R54 Age-related physical debility: Secondary | ICD-10-CM | POA: Diagnosis not present

## 2018-04-08 DIAGNOSIS — N183 Chronic kidney disease, stage 3 (moderate): Secondary | ICD-10-CM | POA: Diagnosis not present

## 2018-04-08 DIAGNOSIS — C490 Malignant neoplasm of connective and soft tissue of head, face and neck: Secondary | ICD-10-CM | POA: Diagnosis not present

## 2018-04-08 DIAGNOSIS — M6250 Muscle wasting and atrophy, not elsewhere classified, unspecified site: Secondary | ICD-10-CM | POA: Diagnosis not present

## 2018-04-08 DIAGNOSIS — K8681 Exocrine pancreatic insufficiency: Secondary | ICD-10-CM | POA: Diagnosis not present

## 2018-04-08 DIAGNOSIS — E1122 Type 2 diabetes mellitus with diabetic chronic kidney disease: Secondary | ICD-10-CM | POA: Diagnosis not present

## 2018-04-08 DIAGNOSIS — E43 Unspecified severe protein-calorie malnutrition: Secondary | ICD-10-CM | POA: Diagnosis not present

## 2018-04-11 ENCOUNTER — Telehealth: Payer: Self-pay | Admitting: *Deleted

## 2018-04-11 DIAGNOSIS — Z4822 Encounter for aftercare following kidney transplant: Secondary | ICD-10-CM | POA: Diagnosis not present

## 2018-04-11 DIAGNOSIS — Z79899 Other long term (current) drug therapy: Secondary | ICD-10-CM | POA: Diagnosis not present

## 2018-04-11 NOTE — Telephone Encounter (Signed)
Received Physician Orders from [facility]; forwarded to provider/SLS 12/23

## 2018-04-12 DIAGNOSIS — E1122 Type 2 diabetes mellitus with diabetic chronic kidney disease: Secondary | ICD-10-CM | POA: Diagnosis not present

## 2018-04-12 DIAGNOSIS — E43 Unspecified severe protein-calorie malnutrition: Secondary | ICD-10-CM | POA: Diagnosis not present

## 2018-04-12 DIAGNOSIS — C490 Malignant neoplasm of connective and soft tissue of head, face and neck: Secondary | ICD-10-CM | POA: Diagnosis not present

## 2018-04-12 DIAGNOSIS — N183 Chronic kidney disease, stage 3 (moderate): Secondary | ICD-10-CM | POA: Diagnosis not present

## 2018-04-12 DIAGNOSIS — K8681 Exocrine pancreatic insufficiency: Secondary | ICD-10-CM | POA: Diagnosis not present

## 2018-04-12 DIAGNOSIS — M6250 Muscle wasting and atrophy, not elsewhere classified, unspecified site: Secondary | ICD-10-CM | POA: Diagnosis not present

## 2018-04-15 DIAGNOSIS — M6250 Muscle wasting and atrophy, not elsewhere classified, unspecified site: Secondary | ICD-10-CM | POA: Diagnosis not present

## 2018-04-15 DIAGNOSIS — E43 Unspecified severe protein-calorie malnutrition: Secondary | ICD-10-CM | POA: Diagnosis not present

## 2018-04-15 DIAGNOSIS — N183 Chronic kidney disease, stage 3 (moderate): Secondary | ICD-10-CM | POA: Diagnosis not present

## 2018-04-15 DIAGNOSIS — G5632 Lesion of radial nerve, left upper limb: Secondary | ICD-10-CM | POA: Diagnosis not present

## 2018-04-15 DIAGNOSIS — E1122 Type 2 diabetes mellitus with diabetic chronic kidney disease: Secondary | ICD-10-CM | POA: Diagnosis not present

## 2018-04-15 DIAGNOSIS — K8681 Exocrine pancreatic insufficiency: Secondary | ICD-10-CM | POA: Diagnosis not present

## 2018-04-15 DIAGNOSIS — C490 Malignant neoplasm of connective and soft tissue of head, face and neck: Secondary | ICD-10-CM | POA: Diagnosis not present

## 2018-04-18 DIAGNOSIS — N183 Chronic kidney disease, stage 3 (moderate): Secondary | ICD-10-CM | POA: Diagnosis not present

## 2018-04-18 DIAGNOSIS — C490 Malignant neoplasm of connective and soft tissue of head, face and neck: Secondary | ICD-10-CM | POA: Diagnosis not present

## 2018-04-18 DIAGNOSIS — K8681 Exocrine pancreatic insufficiency: Secondary | ICD-10-CM | POA: Diagnosis not present

## 2018-04-18 DIAGNOSIS — E43 Unspecified severe protein-calorie malnutrition: Secondary | ICD-10-CM | POA: Diagnosis not present

## 2018-04-18 DIAGNOSIS — R64 Cachexia: Secondary | ICD-10-CM | POA: Diagnosis not present

## 2018-04-18 DIAGNOSIS — E1122 Type 2 diabetes mellitus with diabetic chronic kidney disease: Secondary | ICD-10-CM | POA: Diagnosis not present

## 2018-04-18 DIAGNOSIS — M6250 Muscle wasting and atrophy, not elsewhere classified, unspecified site: Secondary | ICD-10-CM | POA: Diagnosis not present

## 2018-04-19 DIAGNOSIS — E43 Unspecified severe protein-calorie malnutrition: Secondary | ICD-10-CM | POA: Diagnosis not present

## 2018-04-19 DIAGNOSIS — I4892 Unspecified atrial flutter: Secondary | ICD-10-CM | POA: Diagnosis not present

## 2018-04-19 DIAGNOSIS — K8681 Exocrine pancreatic insufficiency: Secondary | ICD-10-CM | POA: Diagnosis not present

## 2018-04-19 DIAGNOSIS — R0600 Dyspnea, unspecified: Secondary | ICD-10-CM | POA: Diagnosis not present

## 2018-04-19 DIAGNOSIS — R918 Other nonspecific abnormal finding of lung field: Secondary | ICD-10-CM | POA: Diagnosis not present

## 2018-04-19 DIAGNOSIS — N183 Chronic kidney disease, stage 3 (moderate): Secondary | ICD-10-CM | POA: Diagnosis not present

## 2018-04-19 DIAGNOSIS — I252 Old myocardial infarction: Secondary | ICD-10-CM | POA: Diagnosis not present

## 2018-04-19 DIAGNOSIS — N186 End stage renal disease: Secondary | ICD-10-CM | POA: Diagnosis not present

## 2018-04-19 DIAGNOSIS — Z7409 Other reduced mobility: Secondary | ICD-10-CM | POA: Diagnosis not present

## 2018-04-19 DIAGNOSIS — I4430 Unspecified atrioventricular block: Secondary | ICD-10-CM | POA: Diagnosis not present

## 2018-04-19 DIAGNOSIS — C490 Malignant neoplasm of connective and soft tissue of head, face and neck: Secondary | ICD-10-CM | POA: Diagnosis not present

## 2018-04-19 DIAGNOSIS — C770 Secondary and unspecified malignant neoplasm of lymph nodes of head, face and neck: Secondary | ICD-10-CM | POA: Diagnosis not present

## 2018-04-19 DIAGNOSIS — M6250 Muscle wasting and atrophy, not elsewhere classified, unspecified site: Secondary | ICD-10-CM | POA: Diagnosis not present

## 2018-04-19 DIAGNOSIS — Z940 Kidney transplant status: Secondary | ICD-10-CM | POA: Diagnosis not present

## 2018-04-19 DIAGNOSIS — E1122 Type 2 diabetes mellitus with diabetic chronic kidney disease: Secondary | ICD-10-CM | POA: Diagnosis not present

## 2018-04-21 DIAGNOSIS — K8681 Exocrine pancreatic insufficiency: Secondary | ICD-10-CM | POA: Diagnosis not present

## 2018-04-21 DIAGNOSIS — N183 Chronic kidney disease, stage 3 (moderate): Secondary | ICD-10-CM | POA: Diagnosis not present

## 2018-04-21 DIAGNOSIS — E43 Unspecified severe protein-calorie malnutrition: Secondary | ICD-10-CM | POA: Diagnosis not present

## 2018-04-21 DIAGNOSIS — C490 Malignant neoplasm of connective and soft tissue of head, face and neck: Secondary | ICD-10-CM | POA: Diagnosis not present

## 2018-04-21 DIAGNOSIS — E1122 Type 2 diabetes mellitus with diabetic chronic kidney disease: Secondary | ICD-10-CM | POA: Diagnosis not present

## 2018-04-21 DIAGNOSIS — M6250 Muscle wasting and atrophy, not elsewhere classified, unspecified site: Secondary | ICD-10-CM | POA: Diagnosis not present

## 2018-04-22 ENCOUNTER — Telehealth: Payer: Self-pay | Admitting: *Deleted

## 2018-04-22 DIAGNOSIS — E860 Dehydration: Secondary | ICD-10-CM | POA: Diagnosis not present

## 2018-04-22 NOTE — Telephone Encounter (Signed)
Received Physician Orders from [facility]; forwarded to provider/SLS01/03

## 2018-04-26 DIAGNOSIS — K869 Disease of pancreas, unspecified: Secondary | ICD-10-CM | POA: Diagnosis not present

## 2018-04-26 DIAGNOSIS — E1159 Type 2 diabetes mellitus with other circulatory complications: Secondary | ICD-10-CM | POA: Diagnosis not present

## 2018-04-26 DIAGNOSIS — J90 Pleural effusion, not elsewhere classified: Secondary | ICD-10-CM | POA: Diagnosis not present

## 2018-04-26 DIAGNOSIS — C7800 Secondary malignant neoplasm of unspecified lung: Secondary | ICD-10-CM | POA: Diagnosis not present

## 2018-04-26 DIAGNOSIS — I4819 Other persistent atrial fibrillation: Secondary | ICD-10-CM | POA: Diagnosis not present

## 2018-04-26 DIAGNOSIS — D61818 Other pancytopenia: Secondary | ICD-10-CM | POA: Diagnosis not present

## 2018-04-26 DIAGNOSIS — I132 Hypertensive heart and chronic kidney disease with heart failure and with stage 5 chronic kidney disease, or end stage renal disease: Secondary | ICD-10-CM | POA: Diagnosis not present

## 2018-04-26 DIAGNOSIS — Z79899 Other long term (current) drug therapy: Secondary | ICD-10-CM | POA: Diagnosis not present

## 2018-04-26 DIAGNOSIS — K838 Other specified diseases of biliary tract: Secondary | ICD-10-CM | POA: Diagnosis not present

## 2018-04-26 DIAGNOSIS — I129 Hypertensive chronic kidney disease with stage 1 through stage 4 chronic kidney disease, or unspecified chronic kidney disease: Secondary | ICD-10-CM | POA: Diagnosis not present

## 2018-04-26 DIAGNOSIS — I429 Cardiomyopathy, unspecified: Secondary | ICD-10-CM | POA: Diagnosis not present

## 2018-04-26 DIAGNOSIS — I482 Chronic atrial fibrillation, unspecified: Secondary | ICD-10-CM | POA: Diagnosis not present

## 2018-04-26 DIAGNOSIS — E1122 Type 2 diabetes mellitus with diabetic chronic kidney disease: Secondary | ICD-10-CM | POA: Diagnosis not present

## 2018-04-26 DIAGNOSIS — R188 Other ascites: Secondary | ICD-10-CM | POA: Diagnosis not present

## 2018-04-26 DIAGNOSIS — R9389 Abnormal findings on diagnostic imaging of other specified body structures: Secondary | ICD-10-CM | POA: Diagnosis not present

## 2018-04-26 DIAGNOSIS — K55069 Acute infarction of intestine, part and extent unspecified: Secondary | ICD-10-CM | POA: Diagnosis not present

## 2018-04-26 DIAGNOSIS — Z940 Kidney transplant status: Secondary | ICD-10-CM | POA: Diagnosis not present

## 2018-04-26 DIAGNOSIS — N183 Chronic kidney disease, stage 3 (moderate): Secondary | ICD-10-CM | POA: Diagnosis not present

## 2018-04-26 DIAGNOSIS — N186 End stage renal disease: Secondary | ICD-10-CM | POA: Diagnosis not present

## 2018-04-26 DIAGNOSIS — K8689 Other specified diseases of pancreas: Secondary | ICD-10-CM | POA: Diagnosis not present

## 2018-04-26 DIAGNOSIS — C490 Malignant neoplasm of connective and soft tissue of head, face and neck: Secondary | ICD-10-CM | POA: Diagnosis not present

## 2018-04-26 DIAGNOSIS — I864 Gastric varices: Secondary | ICD-10-CM | POA: Diagnosis not present

## 2018-04-27 DIAGNOSIS — M6250 Muscle wasting and atrophy, not elsewhere classified, unspecified site: Secondary | ICD-10-CM | POA: Diagnosis not present

## 2018-04-27 DIAGNOSIS — K8681 Exocrine pancreatic insufficiency: Secondary | ICD-10-CM | POA: Diagnosis not present

## 2018-04-27 DIAGNOSIS — N183 Chronic kidney disease, stage 3 (moderate): Secondary | ICD-10-CM | POA: Diagnosis not present

## 2018-04-27 DIAGNOSIS — C490 Malignant neoplasm of connective and soft tissue of head, face and neck: Secondary | ICD-10-CM | POA: Diagnosis not present

## 2018-04-27 DIAGNOSIS — E43 Unspecified severe protein-calorie malnutrition: Secondary | ICD-10-CM | POA: Diagnosis not present

## 2018-04-27 DIAGNOSIS — E1122 Type 2 diabetes mellitus with diabetic chronic kidney disease: Secondary | ICD-10-CM | POA: Diagnosis not present

## 2018-04-29 DIAGNOSIS — C490 Malignant neoplasm of connective and soft tissue of head, face and neck: Secondary | ICD-10-CM | POA: Diagnosis not present

## 2018-04-29 DIAGNOSIS — K8681 Exocrine pancreatic insufficiency: Secondary | ICD-10-CM | POA: Diagnosis not present

## 2018-04-29 DIAGNOSIS — E43 Unspecified severe protein-calorie malnutrition: Secondary | ICD-10-CM | POA: Diagnosis not present

## 2018-04-29 DIAGNOSIS — E1122 Type 2 diabetes mellitus with diabetic chronic kidney disease: Secondary | ICD-10-CM | POA: Diagnosis not present

## 2018-04-29 DIAGNOSIS — M6250 Muscle wasting and atrophy, not elsewhere classified, unspecified site: Secondary | ICD-10-CM | POA: Diagnosis not present

## 2018-04-29 DIAGNOSIS — N183 Chronic kidney disease, stage 3 (moderate): Secondary | ICD-10-CM | POA: Diagnosis not present

## 2018-05-02 DIAGNOSIS — N183 Chronic kidney disease, stage 3 (moderate): Secondary | ICD-10-CM | POA: Diagnosis not present

## 2018-05-02 DIAGNOSIS — E1122 Type 2 diabetes mellitus with diabetic chronic kidney disease: Secondary | ICD-10-CM | POA: Diagnosis not present

## 2018-05-02 DIAGNOSIS — M6250 Muscle wasting and atrophy, not elsewhere classified, unspecified site: Secondary | ICD-10-CM | POA: Diagnosis not present

## 2018-05-02 DIAGNOSIS — K8681 Exocrine pancreatic insufficiency: Secondary | ICD-10-CM | POA: Diagnosis not present

## 2018-05-02 DIAGNOSIS — E43 Unspecified severe protein-calorie malnutrition: Secondary | ICD-10-CM | POA: Diagnosis not present

## 2018-05-02 DIAGNOSIS — C490 Malignant neoplasm of connective and soft tissue of head, face and neck: Secondary | ICD-10-CM | POA: Diagnosis not present

## 2018-05-03 DIAGNOSIS — I129 Hypertensive chronic kidney disease with stage 1 through stage 4 chronic kidney disease, or unspecified chronic kidney disease: Secondary | ICD-10-CM | POA: Diagnosis not present

## 2018-05-03 DIAGNOSIS — H903 Sensorineural hearing loss, bilateral: Secondary | ICD-10-CM | POA: Diagnosis not present

## 2018-05-03 DIAGNOSIS — Z940 Kidney transplant status: Secondary | ICD-10-CM | POA: Diagnosis not present

## 2018-05-03 DIAGNOSIS — I4819 Other persistent atrial fibrillation: Secondary | ICD-10-CM | POA: Diagnosis not present

## 2018-05-03 DIAGNOSIS — N183 Chronic kidney disease, stage 3 (moderate): Secondary | ICD-10-CM | POA: Diagnosis not present

## 2018-05-03 DIAGNOSIS — I864 Gastric varices: Secondary | ICD-10-CM | POA: Diagnosis not present

## 2018-05-03 DIAGNOSIS — I739 Peripheral vascular disease, unspecified: Secondary | ICD-10-CM | POA: Diagnosis not present

## 2018-05-03 DIAGNOSIS — E1159 Type 2 diabetes mellitus with other circulatory complications: Secondary | ICD-10-CM | POA: Diagnosis not present

## 2018-05-03 DIAGNOSIS — I482 Chronic atrial fibrillation, unspecified: Secondary | ICD-10-CM | POA: Diagnosis not present

## 2018-05-03 DIAGNOSIS — C490 Malignant neoplasm of connective and soft tissue of head, face and neck: Secondary | ICD-10-CM | POA: Diagnosis not present

## 2018-05-03 DIAGNOSIS — L97511 Non-pressure chronic ulcer of other part of right foot limited to breakdown of skin: Secondary | ICD-10-CM | POA: Diagnosis not present

## 2018-05-03 DIAGNOSIS — Z5111 Encounter for antineoplastic chemotherapy: Secondary | ICD-10-CM | POA: Diagnosis not present

## 2018-05-03 DIAGNOSIS — C7800 Secondary malignant neoplasm of unspecified lung: Secondary | ICD-10-CM | POA: Diagnosis not present

## 2018-05-05 DIAGNOSIS — I2510 Atherosclerotic heart disease of native coronary artery without angina pectoris: Secondary | ICD-10-CM | POA: Diagnosis not present

## 2018-05-05 DIAGNOSIS — E119 Type 2 diabetes mellitus without complications: Secondary | ICD-10-CM | POA: Diagnosis not present

## 2018-05-05 DIAGNOSIS — Z79899 Other long term (current) drug therapy: Secondary | ICD-10-CM | POA: Diagnosis not present

## 2018-05-05 DIAGNOSIS — Z940 Kidney transplant status: Secondary | ICD-10-CM | POA: Diagnosis not present

## 2018-05-05 DIAGNOSIS — R5381 Other malaise: Secondary | ICD-10-CM | POA: Diagnosis not present

## 2018-05-05 DIAGNOSIS — R634 Abnormal weight loss: Secondary | ICD-10-CM | POA: Diagnosis not present

## 2018-05-05 DIAGNOSIS — I482 Chronic atrial fibrillation, unspecified: Secondary | ICD-10-CM | POA: Diagnosis not present

## 2018-05-05 DIAGNOSIS — Z9221 Personal history of antineoplastic chemotherapy: Secondary | ICD-10-CM | POA: Diagnosis not present

## 2018-05-05 DIAGNOSIS — Z923 Personal history of irradiation: Secondary | ICD-10-CM | POA: Diagnosis not present

## 2018-05-05 DIAGNOSIS — E872 Acidosis: Secondary | ICD-10-CM | POA: Diagnosis not present

## 2018-05-05 DIAGNOSIS — R195 Other fecal abnormalities: Secondary | ICD-10-CM | POA: Diagnosis not present

## 2018-05-05 DIAGNOSIS — Z7952 Long term (current) use of systemic steroids: Secondary | ICD-10-CM | POA: Diagnosis not present

## 2018-05-05 DIAGNOSIS — C499 Malignant neoplasm of connective and soft tissue, unspecified: Secondary | ICD-10-CM | POA: Diagnosis not present

## 2018-05-05 DIAGNOSIS — E785 Hyperlipidemia, unspecified: Secondary | ICD-10-CM | POA: Diagnosis not present

## 2018-05-05 DIAGNOSIS — I4891 Unspecified atrial fibrillation: Secondary | ICD-10-CM | POA: Diagnosis not present

## 2018-05-05 DIAGNOSIS — Z5181 Encounter for therapeutic drug level monitoring: Secondary | ICD-10-CM | POA: Diagnosis not present

## 2018-05-05 DIAGNOSIS — Z4822 Encounter for aftercare following kidney transplant: Secondary | ICD-10-CM | POA: Diagnosis not present

## 2018-05-05 DIAGNOSIS — C490 Malignant neoplasm of connective and soft tissue of head, face and neck: Secondary | ICD-10-CM | POA: Diagnosis not present

## 2018-05-05 DIAGNOSIS — K861 Other chronic pancreatitis: Secondary | ICD-10-CM | POA: Diagnosis not present

## 2018-05-05 DIAGNOSIS — Z7901 Long term (current) use of anticoagulants: Secondary | ICD-10-CM | POA: Diagnosis not present

## 2018-05-05 DIAGNOSIS — R809 Proteinuria, unspecified: Secondary | ICD-10-CM | POA: Diagnosis not present

## 2018-05-05 DIAGNOSIS — C7800 Secondary malignant neoplasm of unspecified lung: Secondary | ICD-10-CM | POA: Diagnosis not present

## 2018-05-05 DIAGNOSIS — I10 Essential (primary) hypertension: Secondary | ICD-10-CM | POA: Diagnosis not present

## 2018-05-06 DIAGNOSIS — R350 Frequency of micturition: Secondary | ICD-10-CM | POA: Diagnosis not present

## 2018-05-06 DIAGNOSIS — N401 Enlarged prostate with lower urinary tract symptoms: Secondary | ICD-10-CM | POA: Diagnosis not present

## 2018-05-06 DIAGNOSIS — C490 Malignant neoplasm of connective and soft tissue of head, face and neck: Secondary | ICD-10-CM | POA: Diagnosis not present

## 2018-05-06 DIAGNOSIS — L97922 Non-pressure chronic ulcer of unspecified part of left lower leg with fat layer exposed: Secondary | ICD-10-CM | POA: Diagnosis not present

## 2018-05-06 DIAGNOSIS — K8689 Other specified diseases of pancreas: Secondary | ICD-10-CM | POA: Diagnosis not present

## 2018-05-11 DIAGNOSIS — C490 Malignant neoplasm of connective and soft tissue of head, face and neck: Secondary | ICD-10-CM | POA: Diagnosis not present

## 2018-05-11 DIAGNOSIS — Z5111 Encounter for antineoplastic chemotherapy: Secondary | ICD-10-CM | POA: Diagnosis not present

## 2018-05-12 ENCOUNTER — Telehealth: Payer: Self-pay | Admitting: Family Medicine

## 2018-05-12 NOTE — Telephone Encounter (Signed)
FYI

## 2018-05-12 NOTE — Telephone Encounter (Signed)
Copied from [place] [phone]. Topic: Quick Communication - See Telephone Encounter >> May 12, 2018 10:16 AM Antonieta Iba C wrote: CRM for notification. See Telephone encounter for: 05/12/18.  Matthew Rowland with [facility], called in to make provider aware that pt stopped answering their calls. Pt's re-cert time frame has expired.   CB: [phone] -

## 2018-05-18 DIAGNOSIS — Z4800 Encounter for change or removal of nonsurgical wound dressing: Secondary | ICD-10-CM | POA: Diagnosis not present

## 2018-05-18 DIAGNOSIS — Z7901 Long term (current) use of anticoagulants: Secondary | ICD-10-CM | POA: Diagnosis not present

## 2018-05-18 DIAGNOSIS — N183 Chronic kidney disease, stage 3 (moderate): Secondary | ICD-10-CM | POA: Diagnosis not present

## 2018-05-18 DIAGNOSIS — N401 Enlarged prostate with lower urinary tract symptoms: Secondary | ICD-10-CM | POA: Diagnosis not present

## 2018-05-18 DIAGNOSIS — E1151 Type 2 diabetes mellitus with diabetic peripheral angiopathy without gangrene: Secondary | ICD-10-CM | POA: Diagnosis not present

## 2018-05-18 DIAGNOSIS — Z79899 Other long term (current) drug therapy: Secondary | ICD-10-CM | POA: Diagnosis not present

## 2018-05-18 DIAGNOSIS — R350 Frequency of micturition: Secondary | ICD-10-CM | POA: Diagnosis not present

## 2018-05-18 DIAGNOSIS — Z940 Kidney transplant status: Secondary | ICD-10-CM | POA: Diagnosis not present

## 2018-05-18 DIAGNOSIS — E1122 Type 2 diabetes mellitus with diabetic chronic kidney disease: Secondary | ICD-10-CM | POA: Diagnosis not present

## 2018-05-18 DIAGNOSIS — I110 Hypertensive heart disease with heart failure: Secondary | ICD-10-CM | POA: Diagnosis not present

## 2018-05-18 DIAGNOSIS — I2510 Atherosclerotic heart disease of native coronary artery without angina pectoris: Secondary | ICD-10-CM | POA: Diagnosis not present

## 2018-05-18 DIAGNOSIS — I4891 Unspecified atrial fibrillation: Secondary | ICD-10-CM | POA: Diagnosis not present

## 2018-05-18 DIAGNOSIS — I5020 Unspecified systolic (congestive) heart failure: Secondary | ICD-10-CM | POA: Diagnosis not present

## 2018-05-18 DIAGNOSIS — L97822 Non-pressure chronic ulcer of other part of left lower leg with fat layer exposed: Secondary | ICD-10-CM | POA: Diagnosis not present

## 2018-05-18 DIAGNOSIS — K8681 Exocrine pancreatic insufficiency: Secondary | ICD-10-CM | POA: Diagnosis not present

## 2018-05-18 DIAGNOSIS — E43 Unspecified severe protein-calorie malnutrition: Secondary | ICD-10-CM | POA: Diagnosis not present

## 2018-05-18 DIAGNOSIS — C490 Malignant neoplasm of connective and soft tissue of head, face and neck: Secondary | ICD-10-CM | POA: Diagnosis not present

## 2018-05-19 DIAGNOSIS — G5632 Lesion of radial nerve, left upper limb: Secondary | ICD-10-CM | POA: Diagnosis not present

## 2018-05-25 DIAGNOSIS — C490 Malignant neoplasm of connective and soft tissue of head, face and neck: Secondary | ICD-10-CM | POA: Diagnosis not present

## 2018-05-25 DIAGNOSIS — E1151 Type 2 diabetes mellitus with diabetic peripheral angiopathy without gangrene: Secondary | ICD-10-CM | POA: Diagnosis not present

## 2018-05-25 DIAGNOSIS — L89151 Pressure ulcer of sacral region, stage 1: Secondary | ICD-10-CM | POA: Diagnosis not present

## 2018-05-25 DIAGNOSIS — Z4800 Encounter for change or removal of nonsurgical wound dressing: Secondary | ICD-10-CM | POA: Diagnosis not present

## 2018-05-25 DIAGNOSIS — E43 Unspecified severe protein-calorie malnutrition: Secondary | ICD-10-CM | POA: Diagnosis not present

## 2018-05-25 DIAGNOSIS — L97822 Non-pressure chronic ulcer of other part of left lower leg with fat layer exposed: Secondary | ICD-10-CM | POA: Diagnosis not present

## 2018-05-25 DIAGNOSIS — K8689 Other specified diseases of pancreas: Secondary | ICD-10-CM | POA: Diagnosis not present

## 2018-05-25 DIAGNOSIS — K8681 Exocrine pancreatic insufficiency: Secondary | ICD-10-CM | POA: Diagnosis not present

## 2018-05-26 DIAGNOSIS — S199XXA Unspecified injury of neck, initial encounter: Secondary | ICD-10-CM | POA: Diagnosis not present

## 2018-05-26 DIAGNOSIS — S61412A Laceration without foreign body of left hand, initial encounter: Secondary | ICD-10-CM | POA: Diagnosis not present

## 2018-05-26 DIAGNOSIS — S3993XA Unspecified injury of pelvis, initial encounter: Secondary | ICD-10-CM | POA: Diagnosis not present

## 2018-05-26 DIAGNOSIS — S0990XA Unspecified injury of head, initial encounter: Secondary | ICD-10-CM | POA: Diagnosis not present

## 2018-05-26 DIAGNOSIS — W01198A Fall on same level from slipping, tripping and stumbling with subsequent striking against other object, initial encounter: Secondary | ICD-10-CM | POA: Diagnosis not present

## 2018-05-26 DIAGNOSIS — R112 Nausea with vomiting, unspecified: Secondary | ICD-10-CM | POA: Diagnosis not present

## 2018-05-26 DIAGNOSIS — S0011XA Contusion of right eyelid and periocular area, initial encounter: Secondary | ICD-10-CM | POA: Diagnosis not present

## 2018-05-26 DIAGNOSIS — S0003XA Contusion of scalp, initial encounter: Secondary | ICD-10-CM | POA: Diagnosis not present

## 2018-05-26 DIAGNOSIS — M542 Cervicalgia: Secondary | ICD-10-CM | POA: Diagnosis not present

## 2018-05-26 DIAGNOSIS — I4892 Unspecified atrial flutter: Secondary | ICD-10-CM | POA: Diagnosis not present

## 2018-05-26 DIAGNOSIS — I4439 Other atrioventricular block: Secondary | ICD-10-CM | POA: Diagnosis not present

## 2018-05-26 DIAGNOSIS — Y998 Other external cause status: Secondary | ICD-10-CM | POA: Diagnosis not present

## 2018-05-27 DIAGNOSIS — Z5111 Encounter for antineoplastic chemotherapy: Secondary | ICD-10-CM | POA: Diagnosis not present

## 2018-05-27 DIAGNOSIS — I471 Supraventricular tachycardia: Secondary | ICD-10-CM | POA: Diagnosis not present

## 2018-05-27 DIAGNOSIS — I499 Cardiac arrhythmia, unspecified: Secondary | ICD-10-CM | POA: Diagnosis not present

## 2018-05-27 DIAGNOSIS — I472 Ventricular tachycardia: Secondary | ICD-10-CM | POA: Diagnosis not present

## 2018-05-27 DIAGNOSIS — I4589 Other specified conduction disorders: Secondary | ICD-10-CM | POA: Diagnosis not present

## 2018-05-27 DIAGNOSIS — R000 Tachycardia, unspecified: Secondary | ICD-10-CM | POA: Diagnosis not present

## 2018-05-27 DIAGNOSIS — C4440 Unspecified malignant neoplasm of skin of scalp and neck: Secondary | ICD-10-CM | POA: Diagnosis not present

## 2018-05-27 DIAGNOSIS — R112 Nausea with vomiting, unspecified: Secondary | ICD-10-CM | POA: Diagnosis not present

## 2018-05-27 DIAGNOSIS — R197 Diarrhea, unspecified: Secondary | ICD-10-CM | POA: Diagnosis not present

## 2018-05-27 DIAGNOSIS — I4892 Unspecified atrial flutter: Secondary | ICD-10-CM | POA: Diagnosis not present

## 2018-05-27 DIAGNOSIS — R1111 Vomiting without nausea: Secondary | ICD-10-CM | POA: Diagnosis not present

## 2018-05-27 DIAGNOSIS — C7800 Secondary malignant neoplasm of unspecified lung: Secondary | ICD-10-CM | POA: Diagnosis not present

## 2018-05-27 DIAGNOSIS — R9431 Abnormal electrocardiogram [ECG] [EKG]: Secondary | ICD-10-CM | POA: Diagnosis not present

## 2018-05-27 DIAGNOSIS — I4891 Unspecified atrial fibrillation: Secondary | ICD-10-CM | POA: Diagnosis not present

## 2018-05-27 DIAGNOSIS — C490 Malignant neoplasm of connective and soft tissue of head, face and neck: Secondary | ICD-10-CM | POA: Diagnosis not present

## 2018-05-27 DIAGNOSIS — W19XXXA Unspecified fall, initial encounter: Secondary | ICD-10-CM | POA: Diagnosis not present

## 2018-06-02 DIAGNOSIS — Z4800 Encounter for change or removal of nonsurgical wound dressing: Secondary | ICD-10-CM | POA: Diagnosis not present

## 2018-06-02 DIAGNOSIS — E1151 Type 2 diabetes mellitus with diabetic peripheral angiopathy without gangrene: Secondary | ICD-10-CM | POA: Diagnosis not present

## 2018-06-02 DIAGNOSIS — K8681 Exocrine pancreatic insufficiency: Secondary | ICD-10-CM | POA: Diagnosis not present

## 2018-06-02 DIAGNOSIS — E43 Unspecified severe protein-calorie malnutrition: Secondary | ICD-10-CM | POA: Diagnosis not present

## 2018-06-02 DIAGNOSIS — L97822 Non-pressure chronic ulcer of other part of left lower leg with fat layer exposed: Secondary | ICD-10-CM | POA: Diagnosis not present

## 2018-06-02 DIAGNOSIS — C490 Malignant neoplasm of connective and soft tissue of head, face and neck: Secondary | ICD-10-CM | POA: Diagnosis not present

## 2018-06-03 DIAGNOSIS — N186 End stage renal disease: Secondary | ICD-10-CM | POA: Diagnosis not present

## 2018-06-03 DIAGNOSIS — C490 Malignant neoplasm of connective and soft tissue of head, face and neck: Secondary | ICD-10-CM | POA: Diagnosis not present

## 2018-06-03 DIAGNOSIS — C7800 Secondary malignant neoplasm of unspecified lung: Secondary | ICD-10-CM | POA: Diagnosis not present

## 2018-06-03 DIAGNOSIS — Z940 Kidney transplant status: Secondary | ICD-10-CM | POA: Diagnosis not present

## 2018-06-03 DIAGNOSIS — Z5111 Encounter for antineoplastic chemotherapy: Secondary | ICD-10-CM | POA: Diagnosis not present

## 2018-06-03 DIAGNOSIS — Z85828 Personal history of other malignant neoplasm of skin: Secondary | ICD-10-CM | POA: Diagnosis not present

## 2018-06-03 DIAGNOSIS — D61818 Other pancytopenia: Secondary | ICD-10-CM | POA: Diagnosis not present

## 2018-06-09 DIAGNOSIS — L97822 Non-pressure chronic ulcer of other part of left lower leg with fat layer exposed: Secondary | ICD-10-CM | POA: Diagnosis not present

## 2018-06-09 DIAGNOSIS — C490 Malignant neoplasm of connective and soft tissue of head, face and neck: Secondary | ICD-10-CM | POA: Diagnosis not present

## 2018-06-09 DIAGNOSIS — E43 Unspecified severe protein-calorie malnutrition: Secondary | ICD-10-CM | POA: Diagnosis not present

## 2018-06-09 DIAGNOSIS — K8681 Exocrine pancreatic insufficiency: Secondary | ICD-10-CM | POA: Diagnosis not present

## 2018-06-09 DIAGNOSIS — Z4800 Encounter for change or removal of nonsurgical wound dressing: Secondary | ICD-10-CM | POA: Diagnosis not present

## 2018-06-09 DIAGNOSIS — E1151 Type 2 diabetes mellitus with diabetic peripheral angiopathy without gangrene: Secondary | ICD-10-CM | POA: Diagnosis not present

## 2018-06-10 DIAGNOSIS — I4892 Unspecified atrial flutter: Secondary | ICD-10-CM | POA: Diagnosis not present

## 2018-06-10 DIAGNOSIS — I499 Cardiac arrhythmia, unspecified: Secondary | ICD-10-CM | POA: Diagnosis not present

## 2018-06-10 DIAGNOSIS — I4430 Unspecified atrioventricular block: Secondary | ICD-10-CM | POA: Diagnosis not present

## 2018-06-10 DIAGNOSIS — C490 Malignant neoplasm of connective and soft tissue of head, face and neck: Secondary | ICD-10-CM | POA: Diagnosis not present

## 2018-06-10 DIAGNOSIS — Z5111 Encounter for antineoplastic chemotherapy: Secondary | ICD-10-CM | POA: Diagnosis not present

## 2018-06-16 DIAGNOSIS — E875 Hyperkalemia: Secondary | ICD-10-CM | POA: Diagnosis not present

## 2018-06-16 DIAGNOSIS — Z8619 Personal history of other infectious and parasitic diseases: Secondary | ICD-10-CM | POA: Diagnosis not present

## 2018-06-16 DIAGNOSIS — Z79899 Other long term (current) drug therapy: Secondary | ICD-10-CM | POA: Diagnosis not present

## 2018-06-16 DIAGNOSIS — D899 Disorder involving the immune mechanism, unspecified: Secondary | ICD-10-CM | POA: Diagnosis not present

## 2018-06-16 DIAGNOSIS — E872 Acidosis: Secondary | ICD-10-CM | POA: Diagnosis not present

## 2018-06-16 DIAGNOSIS — Z940 Kidney transplant status: Secondary | ICD-10-CM | POA: Diagnosis not present

## 2018-06-16 DIAGNOSIS — Z923 Personal history of irradiation: Secondary | ICD-10-CM | POA: Diagnosis not present

## 2018-06-16 DIAGNOSIS — Z4822 Encounter for aftercare following kidney transplant: Secondary | ICD-10-CM | POA: Diagnosis not present

## 2018-06-16 DIAGNOSIS — Z7952 Long term (current) use of systemic steroids: Secondary | ICD-10-CM | POA: Diagnosis not present

## 2018-06-16 DIAGNOSIS — R809 Proteinuria, unspecified: Secondary | ICD-10-CM | POA: Diagnosis not present

## 2018-06-16 DIAGNOSIS — R5381 Other malaise: Secondary | ICD-10-CM | POA: Diagnosis not present

## 2018-06-16 DIAGNOSIS — K909 Intestinal malabsorption, unspecified: Secondary | ICD-10-CM | POA: Diagnosis not present

## 2018-06-16 DIAGNOSIS — Z5181 Encounter for therapeutic drug level monitoring: Secondary | ICD-10-CM | POA: Diagnosis not present

## 2018-06-16 DIAGNOSIS — K861 Other chronic pancreatitis: Secondary | ICD-10-CM | POA: Diagnosis not present

## 2018-06-16 DIAGNOSIS — Z7901 Long term (current) use of anticoagulants: Secondary | ICD-10-CM | POA: Diagnosis not present

## 2018-06-16 DIAGNOSIS — Z859 Personal history of malignant neoplasm, unspecified: Secondary | ICD-10-CM | POA: Diagnosis not present

## 2018-06-16 DIAGNOSIS — I2510 Atherosclerotic heart disease of native coronary artery without angina pectoris: Secondary | ICD-10-CM | POA: Diagnosis not present

## 2018-06-16 DIAGNOSIS — C490 Malignant neoplasm of connective and soft tissue of head, face and neck: Secondary | ICD-10-CM | POA: Diagnosis not present

## 2018-06-16 DIAGNOSIS — C4440 Unspecified malignant neoplasm of skin of scalp and neck: Secondary | ICD-10-CM | POA: Diagnosis not present

## 2018-06-16 DIAGNOSIS — I10 Essential (primary) hypertension: Secondary | ICD-10-CM | POA: Diagnosis not present

## 2018-06-16 DIAGNOSIS — I4891 Unspecified atrial fibrillation: Secondary | ICD-10-CM | POA: Diagnosis not present

## 2018-06-17 DIAGNOSIS — E1159 Type 2 diabetes mellitus with other circulatory complications: Secondary | ICD-10-CM | POA: Diagnosis not present

## 2018-06-17 DIAGNOSIS — E785 Hyperlipidemia, unspecified: Secondary | ICD-10-CM | POA: Diagnosis not present

## 2018-06-17 DIAGNOSIS — Z9181 History of falling: Secondary | ICD-10-CM | POA: Diagnosis not present

## 2018-06-17 DIAGNOSIS — I429 Cardiomyopathy, unspecified: Secondary | ICD-10-CM | POA: Diagnosis not present

## 2018-06-17 DIAGNOSIS — I2510 Atherosclerotic heart disease of native coronary artery without angina pectoris: Secondary | ICD-10-CM | POA: Diagnosis not present

## 2018-06-17 DIAGNOSIS — R9431 Abnormal electrocardiogram [ECG] [EKG]: Secondary | ICD-10-CM | POA: Diagnosis not present

## 2018-06-17 DIAGNOSIS — C7802 Secondary malignant neoplasm of left lung: Secondary | ICD-10-CM | POA: Diagnosis not present

## 2018-06-17 DIAGNOSIS — E11621 Type 2 diabetes mellitus with foot ulcer: Secondary | ICD-10-CM | POA: Diagnosis not present

## 2018-06-17 DIAGNOSIS — I482 Chronic atrial fibrillation, unspecified: Secondary | ICD-10-CM | POA: Diagnosis not present

## 2018-06-17 DIAGNOSIS — Z7901 Long term (current) use of anticoagulants: Secondary | ICD-10-CM | POA: Diagnosis not present

## 2018-06-17 DIAGNOSIS — I130 Hypertensive heart and chronic kidney disease with heart failure and stage 1 through stage 4 chronic kidney disease, or unspecified chronic kidney disease: Secondary | ICD-10-CM | POA: Diagnosis not present

## 2018-06-17 DIAGNOSIS — I5020 Unspecified systolic (congestive) heart failure: Secondary | ICD-10-CM | POA: Diagnosis not present

## 2018-06-17 DIAGNOSIS — E43 Unspecified severe protein-calorie malnutrition: Secondary | ICD-10-CM | POA: Diagnosis not present

## 2018-06-17 DIAGNOSIS — I864 Gastric varices: Secondary | ICD-10-CM | POA: Diagnosis not present

## 2018-06-17 DIAGNOSIS — I25119 Atherosclerotic heart disease of native coronary artery with unspecified angina pectoris: Secondary | ICD-10-CM | POA: Diagnosis not present

## 2018-06-17 DIAGNOSIS — Z7984 Long term (current) use of oral hypoglycemic drugs: Secondary | ICD-10-CM | POA: Diagnosis not present

## 2018-06-17 DIAGNOSIS — E1122 Type 2 diabetes mellitus with diabetic chronic kidney disease: Secondary | ICD-10-CM | POA: Diagnosis not present

## 2018-06-17 DIAGNOSIS — G4730 Sleep apnea, unspecified: Secondary | ICD-10-CM | POA: Diagnosis not present

## 2018-06-17 DIAGNOSIS — Z4800 Encounter for change or removal of nonsurgical wound dressing: Secondary | ICD-10-CM | POA: Diagnosis not present

## 2018-06-17 DIAGNOSIS — I499 Cardiac arrhythmia, unspecified: Secondary | ICD-10-CM | POA: Diagnosis not present

## 2018-06-17 DIAGNOSIS — M109 Gout, unspecified: Secondary | ICD-10-CM | POA: Diagnosis not present

## 2018-06-17 DIAGNOSIS — E1151 Type 2 diabetes mellitus with diabetic peripheral angiopathy without gangrene: Secondary | ICD-10-CM | POA: Diagnosis not present

## 2018-06-17 DIAGNOSIS — Z955 Presence of coronary angioplasty implant and graft: Secondary | ICD-10-CM | POA: Diagnosis not present

## 2018-06-17 DIAGNOSIS — K8681 Exocrine pancreatic insufficiency: Secondary | ICD-10-CM | POA: Diagnosis not present

## 2018-06-17 DIAGNOSIS — N401 Enlarged prostate with lower urinary tract symptoms: Secondary | ICD-10-CM | POA: Diagnosis not present

## 2018-06-17 DIAGNOSIS — N183 Chronic kidney disease, stage 3 (moderate): Secondary | ICD-10-CM | POA: Diagnosis not present

## 2018-06-17 DIAGNOSIS — I110 Hypertensive heart disease with heart failure: Secondary | ICD-10-CM | POA: Diagnosis not present

## 2018-06-17 DIAGNOSIS — I509 Heart failure, unspecified: Secondary | ICD-10-CM | POA: Diagnosis not present

## 2018-06-17 DIAGNOSIS — C7801 Secondary malignant neoplasm of right lung: Secondary | ICD-10-CM | POA: Diagnosis not present

## 2018-06-17 DIAGNOSIS — C490 Malignant neoplasm of connective and soft tissue of head, face and neck: Secondary | ICD-10-CM | POA: Diagnosis not present

## 2018-06-17 DIAGNOSIS — L97511 Non-pressure chronic ulcer of other part of right foot limited to breakdown of skin: Secondary | ICD-10-CM | POA: Diagnosis not present

## 2018-06-17 DIAGNOSIS — I4891 Unspecified atrial fibrillation: Secondary | ICD-10-CM | POA: Diagnosis not present

## 2018-06-17 DIAGNOSIS — H903 Sensorineural hearing loss, bilateral: Secondary | ICD-10-CM | POA: Diagnosis not present

## 2018-06-17 DIAGNOSIS — R350 Frequency of micturition: Secondary | ICD-10-CM | POA: Diagnosis not present

## 2018-06-17 DIAGNOSIS — Z940 Kidney transplant status: Secondary | ICD-10-CM | POA: Diagnosis not present

## 2018-06-17 DIAGNOSIS — Z5181 Encounter for therapeutic drug level monitoring: Secondary | ICD-10-CM | POA: Diagnosis not present

## 2018-06-17 DIAGNOSIS — L97822 Non-pressure chronic ulcer of other part of left lower leg with fat layer exposed: Secondary | ICD-10-CM | POA: Diagnosis not present

## 2018-06-17 DIAGNOSIS — Z79899 Other long term (current) drug therapy: Secondary | ICD-10-CM | POA: Diagnosis not present

## 2018-06-20 DIAGNOSIS — E43 Unspecified severe protein-calorie malnutrition: Secondary | ICD-10-CM | POA: Diagnosis not present

## 2018-06-20 DIAGNOSIS — Z4800 Encounter for change or removal of nonsurgical wound dressing: Secondary | ICD-10-CM | POA: Diagnosis not present

## 2018-06-20 DIAGNOSIS — E1151 Type 2 diabetes mellitus with diabetic peripheral angiopathy without gangrene: Secondary | ICD-10-CM | POA: Diagnosis not present

## 2018-06-20 DIAGNOSIS — C490 Malignant neoplasm of connective and soft tissue of head, face and neck: Secondary | ICD-10-CM | POA: Diagnosis not present

## 2018-06-20 DIAGNOSIS — K8681 Exocrine pancreatic insufficiency: Secondary | ICD-10-CM | POA: Diagnosis not present

## 2018-06-20 DIAGNOSIS — L97822 Non-pressure chronic ulcer of other part of left lower leg with fat layer exposed: Secondary | ICD-10-CM | POA: Diagnosis not present

## 2018-06-23 DIAGNOSIS — I4892 Unspecified atrial flutter: Secondary | ICD-10-CM | POA: Diagnosis not present

## 2018-06-28 DIAGNOSIS — L97822 Non-pressure chronic ulcer of other part of left lower leg with fat layer exposed: Secondary | ICD-10-CM | POA: Diagnosis not present

## 2018-06-28 DIAGNOSIS — E1151 Type 2 diabetes mellitus with diabetic peripheral angiopathy without gangrene: Secondary | ICD-10-CM | POA: Diagnosis not present

## 2018-06-28 DIAGNOSIS — Z4800 Encounter for change or removal of nonsurgical wound dressing: Secondary | ICD-10-CM | POA: Diagnosis not present

## 2018-06-28 DIAGNOSIS — K8681 Exocrine pancreatic insufficiency: Secondary | ICD-10-CM | POA: Diagnosis not present

## 2018-06-28 DIAGNOSIS — E43 Unspecified severe protein-calorie malnutrition: Secondary | ICD-10-CM | POA: Diagnosis not present

## 2018-06-28 DIAGNOSIS — C490 Malignant neoplasm of connective and soft tissue of head, face and neck: Secondary | ICD-10-CM | POA: Diagnosis not present

## 2018-07-01 DIAGNOSIS — C490 Malignant neoplasm of connective and soft tissue of head, face and neck: Secondary | ICD-10-CM | POA: Diagnosis not present

## 2018-07-01 DIAGNOSIS — Z5111 Encounter for antineoplastic chemotherapy: Secondary | ICD-10-CM | POA: Diagnosis not present

## 2018-07-01 DIAGNOSIS — C7800 Secondary malignant neoplasm of unspecified lung: Secondary | ICD-10-CM | POA: Diagnosis not present

## 2018-07-01 DIAGNOSIS — R9389 Abnormal findings on diagnostic imaging of other specified body structures: Secondary | ICD-10-CM | POA: Diagnosis not present

## 2018-07-01 DIAGNOSIS — I4819 Other persistent atrial fibrillation: Secondary | ICD-10-CM | POA: Diagnosis not present

## 2018-07-01 DIAGNOSIS — C499 Malignant neoplasm of connective and soft tissue, unspecified: Secondary | ICD-10-CM | POA: Diagnosis not present

## 2018-07-01 DIAGNOSIS — N183 Chronic kidney disease, stage 3 (moderate): Secondary | ICD-10-CM | POA: Diagnosis not present

## 2018-07-05 DIAGNOSIS — K8681 Exocrine pancreatic insufficiency: Secondary | ICD-10-CM | POA: Diagnosis not present

## 2018-07-05 DIAGNOSIS — L97822 Non-pressure chronic ulcer of other part of left lower leg with fat layer exposed: Secondary | ICD-10-CM | POA: Diagnosis not present

## 2018-07-05 DIAGNOSIS — E1151 Type 2 diabetes mellitus with diabetic peripheral angiopathy without gangrene: Secondary | ICD-10-CM | POA: Diagnosis not present

## 2018-07-05 DIAGNOSIS — Z4800 Encounter for change or removal of nonsurgical wound dressing: Secondary | ICD-10-CM | POA: Diagnosis not present

## 2018-07-05 DIAGNOSIS — C490 Malignant neoplasm of connective and soft tissue of head, face and neck: Secondary | ICD-10-CM | POA: Diagnosis not present

## 2018-07-05 DIAGNOSIS — E43 Unspecified severe protein-calorie malnutrition: Secondary | ICD-10-CM | POA: Diagnosis not present

## 2018-07-08 DIAGNOSIS — C490 Malignant neoplasm of connective and soft tissue of head, face and neck: Secondary | ICD-10-CM | POA: Diagnosis not present

## 2018-07-08 DIAGNOSIS — C7800 Secondary malignant neoplasm of unspecified lung: Secondary | ICD-10-CM | POA: Diagnosis not present

## 2018-07-08 DIAGNOSIS — Z5111 Encounter for antineoplastic chemotherapy: Secondary | ICD-10-CM | POA: Diagnosis not present

## 2018-07-14 DIAGNOSIS — Z4800 Encounter for change or removal of nonsurgical wound dressing: Secondary | ICD-10-CM | POA: Diagnosis not present

## 2018-07-14 DIAGNOSIS — E1151 Type 2 diabetes mellitus with diabetic peripheral angiopathy without gangrene: Secondary | ICD-10-CM | POA: Diagnosis not present

## 2018-07-14 DIAGNOSIS — K8681 Exocrine pancreatic insufficiency: Secondary | ICD-10-CM | POA: Diagnosis not present

## 2018-07-14 DIAGNOSIS — L97822 Non-pressure chronic ulcer of other part of left lower leg with fat layer exposed: Secondary | ICD-10-CM | POA: Diagnosis not present

## 2018-07-14 DIAGNOSIS — E43 Unspecified severe protein-calorie malnutrition: Secondary | ICD-10-CM | POA: Diagnosis not present

## 2018-07-14 DIAGNOSIS — C490 Malignant neoplasm of connective and soft tissue of head, face and neck: Secondary | ICD-10-CM | POA: Diagnosis not present

## 2018-07-15 DIAGNOSIS — C802 Malignant neoplasm associated with transplanted organ: Secondary | ICD-10-CM | POA: Diagnosis not present

## 2018-07-15 DIAGNOSIS — C7801 Secondary malignant neoplasm of right lung: Secondary | ICD-10-CM | POA: Diagnosis not present

## 2018-07-15 DIAGNOSIS — Z5111 Encounter for antineoplastic chemotherapy: Secondary | ICD-10-CM | POA: Diagnosis not present

## 2018-07-15 DIAGNOSIS — Z940 Kidney transplant status: Secondary | ICD-10-CM | POA: Diagnosis not present

## 2018-07-15 DIAGNOSIS — C490 Malignant neoplasm of connective and soft tissue of head, face and neck: Secondary | ICD-10-CM | POA: Diagnosis not present

## 2018-07-17 DIAGNOSIS — I5020 Unspecified systolic (congestive) heart failure: Secondary | ICD-10-CM | POA: Diagnosis not present

## 2018-07-17 DIAGNOSIS — I2510 Atherosclerotic heart disease of native coronary artery without angina pectoris: Secondary | ICD-10-CM | POA: Diagnosis not present

## 2018-07-17 DIAGNOSIS — R350 Frequency of micturition: Secondary | ICD-10-CM | POA: Diagnosis not present

## 2018-07-17 DIAGNOSIS — N401 Enlarged prostate with lower urinary tract symptoms: Secondary | ICD-10-CM | POA: Diagnosis not present

## 2018-07-17 DIAGNOSIS — I110 Hypertensive heart disease with heart failure: Secondary | ICD-10-CM | POA: Diagnosis not present

## 2018-07-17 DIAGNOSIS — C490 Malignant neoplasm of connective and soft tissue of head, face and neck: Secondary | ICD-10-CM | POA: Diagnosis not present

## 2018-07-17 DIAGNOSIS — L97822 Non-pressure chronic ulcer of other part of left lower leg with fat layer exposed: Secondary | ICD-10-CM | POA: Diagnosis not present

## 2018-07-17 DIAGNOSIS — E1151 Type 2 diabetes mellitus with diabetic peripheral angiopathy without gangrene: Secondary | ICD-10-CM | POA: Diagnosis not present

## 2018-07-17 DIAGNOSIS — Z79899 Other long term (current) drug therapy: Secondary | ICD-10-CM | POA: Diagnosis not present

## 2018-07-17 DIAGNOSIS — E43 Unspecified severe protein-calorie malnutrition: Secondary | ICD-10-CM | POA: Diagnosis not present

## 2018-07-17 DIAGNOSIS — K8681 Exocrine pancreatic insufficiency: Secondary | ICD-10-CM | POA: Diagnosis not present

## 2018-07-17 DIAGNOSIS — I4891 Unspecified atrial fibrillation: Secondary | ICD-10-CM | POA: Diagnosis not present

## 2018-07-17 DIAGNOSIS — Z940 Kidney transplant status: Secondary | ICD-10-CM | POA: Diagnosis not present

## 2018-07-17 DIAGNOSIS — Z4800 Encounter for change or removal of nonsurgical wound dressing: Secondary | ICD-10-CM | POA: Diagnosis not present

## 2018-07-17 DIAGNOSIS — Z7901 Long term (current) use of anticoagulants: Secondary | ICD-10-CM | POA: Diagnosis not present

## 2018-07-17 DIAGNOSIS — E1122 Type 2 diabetes mellitus with diabetic chronic kidney disease: Secondary | ICD-10-CM | POA: Diagnosis not present

## 2018-07-17 DIAGNOSIS — N183 Chronic kidney disease, stage 3 (moderate): Secondary | ICD-10-CM | POA: Diagnosis not present

## 2018-07-19 DIAGNOSIS — C490 Malignant neoplasm of connective and soft tissue of head, face and neck: Secondary | ICD-10-CM | POA: Diagnosis not present

## 2018-07-19 DIAGNOSIS — E1151 Type 2 diabetes mellitus with diabetic peripheral angiopathy without gangrene: Secondary | ICD-10-CM | POA: Diagnosis not present

## 2018-07-19 DIAGNOSIS — L97822 Non-pressure chronic ulcer of other part of left lower leg with fat layer exposed: Secondary | ICD-10-CM | POA: Diagnosis not present

## 2018-07-19 DIAGNOSIS — K8681 Exocrine pancreatic insufficiency: Secondary | ICD-10-CM | POA: Diagnosis not present

## 2018-07-19 DIAGNOSIS — Z4800 Encounter for change or removal of nonsurgical wound dressing: Secondary | ICD-10-CM | POA: Diagnosis not present

## 2018-07-19 DIAGNOSIS — E43 Unspecified severe protein-calorie malnutrition: Secondary | ICD-10-CM | POA: Diagnosis not present

## 2018-07-27 DIAGNOSIS — Z79899 Other long term (current) drug therapy: Secondary | ICD-10-CM | POA: Diagnosis not present

## 2018-07-27 DIAGNOSIS — R188 Other ascites: Secondary | ICD-10-CM | POA: Diagnosis not present

## 2018-07-27 DIAGNOSIS — C7800 Secondary malignant neoplasm of unspecified lung: Secondary | ICD-10-CM | POA: Diagnosis not present

## 2018-07-27 DIAGNOSIS — C490 Malignant neoplasm of connective and soft tissue of head, face and neck: Secondary | ICD-10-CM | POA: Diagnosis not present

## 2018-07-27 DIAGNOSIS — J90 Pleural effusion, not elsewhere classified: Secondary | ICD-10-CM | POA: Diagnosis not present

## 2018-07-27 DIAGNOSIS — C499 Malignant neoplasm of connective and soft tissue, unspecified: Secondary | ICD-10-CM | POA: Diagnosis not present

## 2018-07-27 DIAGNOSIS — R9389 Abnormal findings on diagnostic imaging of other specified body structures: Secondary | ICD-10-CM | POA: Diagnosis not present

## 2018-07-27 DIAGNOSIS — N183 Chronic kidney disease, stage 3 (moderate): Secondary | ICD-10-CM | POA: Diagnosis not present

## 2018-07-27 DIAGNOSIS — I4819 Other persistent atrial fibrillation: Secondary | ICD-10-CM | POA: Diagnosis not present

## 2018-07-27 DIAGNOSIS — R590 Localized enlarged lymph nodes: Secondary | ICD-10-CM | POA: Diagnosis not present

## 2018-07-28 DIAGNOSIS — E43 Unspecified severe protein-calorie malnutrition: Secondary | ICD-10-CM | POA: Diagnosis not present

## 2018-07-28 DIAGNOSIS — E1151 Type 2 diabetes mellitus with diabetic peripheral angiopathy without gangrene: Secondary | ICD-10-CM | POA: Diagnosis not present

## 2018-07-28 DIAGNOSIS — C490 Malignant neoplasm of connective and soft tissue of head, face and neck: Secondary | ICD-10-CM | POA: Diagnosis not present

## 2018-07-28 DIAGNOSIS — Z4800 Encounter for change or removal of nonsurgical wound dressing: Secondary | ICD-10-CM | POA: Diagnosis not present

## 2018-07-28 DIAGNOSIS — L97822 Non-pressure chronic ulcer of other part of left lower leg with fat layer exposed: Secondary | ICD-10-CM | POA: Diagnosis not present

## 2018-07-28 DIAGNOSIS — K8681 Exocrine pancreatic insufficiency: Secondary | ICD-10-CM | POA: Diagnosis not present

## 2018-07-29 DIAGNOSIS — I4891 Unspecified atrial fibrillation: Secondary | ICD-10-CM | POA: Diagnosis not present

## 2018-07-29 DIAGNOSIS — C802 Malignant neoplasm associated with transplanted organ: Secondary | ICD-10-CM | POA: Diagnosis not present

## 2018-07-29 DIAGNOSIS — R000 Tachycardia, unspecified: Secondary | ICD-10-CM | POA: Diagnosis not present

## 2018-07-29 DIAGNOSIS — C7801 Secondary malignant neoplasm of right lung: Secondary | ICD-10-CM | POA: Diagnosis not present

## 2018-07-29 DIAGNOSIS — Z5111 Encounter for antineoplastic chemotherapy: Secondary | ICD-10-CM | POA: Diagnosis not present

## 2018-07-29 DIAGNOSIS — C490 Malignant neoplasm of connective and soft tissue of head, face and neck: Secondary | ICD-10-CM | POA: Diagnosis not present

## 2018-07-29 DIAGNOSIS — C7800 Secondary malignant neoplasm of unspecified lung: Secondary | ICD-10-CM | POA: Diagnosis not present

## 2018-08-03 DIAGNOSIS — K8681 Exocrine pancreatic insufficiency: Secondary | ICD-10-CM | POA: Diagnosis not present

## 2018-08-03 DIAGNOSIS — K8689 Other specified diseases of pancreas: Secondary | ICD-10-CM | POA: Diagnosis not present

## 2018-08-03 DIAGNOSIS — E43 Unspecified severe protein-calorie malnutrition: Secondary | ICD-10-CM | POA: Diagnosis not present

## 2018-08-03 DIAGNOSIS — E11622 Type 2 diabetes mellitus with other skin ulcer: Secondary | ICD-10-CM | POA: Diagnosis not present

## 2018-08-03 DIAGNOSIS — Z4800 Encounter for change or removal of nonsurgical wound dressing: Secondary | ICD-10-CM | POA: Diagnosis not present

## 2018-08-03 DIAGNOSIS — L97309 Non-pressure chronic ulcer of unspecified ankle with unspecified severity: Secondary | ICD-10-CM | POA: Diagnosis not present

## 2018-08-03 DIAGNOSIS — L97822 Non-pressure chronic ulcer of other part of left lower leg with fat layer exposed: Secondary | ICD-10-CM | POA: Diagnosis not present

## 2018-08-03 DIAGNOSIS — G8929 Other chronic pain: Secondary | ICD-10-CM | POA: Diagnosis not present

## 2018-08-03 DIAGNOSIS — M25571 Pain in right ankle and joints of right foot: Secondary | ICD-10-CM | POA: Diagnosis not present

## 2018-08-03 DIAGNOSIS — C490 Malignant neoplasm of connective and soft tissue of head, face and neck: Secondary | ICD-10-CM | POA: Diagnosis not present

## 2018-08-03 DIAGNOSIS — M86271 Subacute osteomyelitis, right ankle and foot: Secondary | ICD-10-CM | POA: Diagnosis not present

## 2018-08-03 DIAGNOSIS — E1151 Type 2 diabetes mellitus with diabetic peripheral angiopathy without gangrene: Secondary | ICD-10-CM | POA: Diagnosis not present

## 2018-08-03 DIAGNOSIS — D899 Disorder involving the immune mechanism, unspecified: Secondary | ICD-10-CM | POA: Diagnosis not present

## 2018-08-04 DIAGNOSIS — N183 Chronic kidney disease, stage 3 (moderate): Secondary | ICD-10-CM | POA: Diagnosis not present

## 2018-08-04 DIAGNOSIS — E119 Type 2 diabetes mellitus without complications: Secondary | ICD-10-CM | POA: Diagnosis not present

## 2018-08-04 DIAGNOSIS — I4891 Unspecified atrial fibrillation: Secondary | ICD-10-CM | POA: Diagnosis not present

## 2018-08-04 DIAGNOSIS — E11622 Type 2 diabetes mellitus with other skin ulcer: Secondary | ICD-10-CM | POA: Diagnosis not present

## 2018-08-04 DIAGNOSIS — K8681 Exocrine pancreatic insufficiency: Secondary | ICD-10-CM | POA: Diagnosis not present

## 2018-08-04 DIAGNOSIS — L97309 Non-pressure chronic ulcer of unspecified ankle with unspecified severity: Secondary | ICD-10-CM | POA: Diagnosis not present

## 2018-08-04 DIAGNOSIS — M86171 Other acute osteomyelitis, right ankle and foot: Secondary | ICD-10-CM | POA: Diagnosis not present

## 2018-08-04 DIAGNOSIS — I482 Chronic atrial fibrillation, unspecified: Secondary | ICD-10-CM | POA: Diagnosis not present

## 2018-08-04 DIAGNOSIS — D899 Disorder involving the immune mechanism, unspecified: Secondary | ICD-10-CM | POA: Diagnosis not present

## 2018-08-04 DIAGNOSIS — L97511 Non-pressure chronic ulcer of other part of right foot limited to breakdown of skin: Secondary | ICD-10-CM | POA: Diagnosis not present

## 2018-08-04 DIAGNOSIS — M86271 Subacute osteomyelitis, right ankle and foot: Secondary | ICD-10-CM | POA: Diagnosis not present

## 2018-08-04 DIAGNOSIS — K8689 Other specified diseases of pancreas: Secondary | ICD-10-CM | POA: Diagnosis present

## 2018-08-04 DIAGNOSIS — Z7901 Long term (current) use of anticoagulants: Secondary | ICD-10-CM | POA: Diagnosis not present

## 2018-08-04 DIAGNOSIS — M25171 Fistula, right ankle: Secondary | ICD-10-CM | POA: Diagnosis present

## 2018-08-04 DIAGNOSIS — I130 Hypertensive heart and chronic kidney disease with heart failure and stage 1 through stage 4 chronic kidney disease, or unspecified chronic kidney disease: Secondary | ICD-10-CM | POA: Diagnosis present

## 2018-08-04 DIAGNOSIS — Z940 Kidney transplant status: Secondary | ICD-10-CM | POA: Diagnosis not present

## 2018-08-04 DIAGNOSIS — I4892 Unspecified atrial flutter: Secondary | ICD-10-CM | POA: Diagnosis present

## 2018-08-04 DIAGNOSIS — M00871 Arthritis due to other bacteria, right ankle and foot: Secondary | ICD-10-CM | POA: Diagnosis not present

## 2018-08-04 DIAGNOSIS — I5022 Chronic systolic (congestive) heart failure: Secondary | ICD-10-CM | POA: Diagnosis present

## 2018-08-04 DIAGNOSIS — E1169 Type 2 diabetes mellitus with other specified complication: Secondary | ICD-10-CM | POA: Diagnosis present

## 2018-08-04 DIAGNOSIS — C490 Malignant neoplasm of connective and soft tissue of head, face and neck: Secondary | ICD-10-CM | POA: Diagnosis present

## 2018-08-04 DIAGNOSIS — L988 Other specified disorders of the skin and subcutaneous tissue: Secondary | ICD-10-CM | POA: Diagnosis not present

## 2018-08-04 DIAGNOSIS — C7800 Secondary malignant neoplasm of unspecified lung: Secondary | ICD-10-CM | POA: Diagnosis present

## 2018-08-04 DIAGNOSIS — R000 Tachycardia, unspecified: Secondary | ICD-10-CM | POA: Diagnosis present

## 2018-08-04 DIAGNOSIS — E1122 Type 2 diabetes mellitus with diabetic chronic kidney disease: Secondary | ICD-10-CM | POA: Diagnosis present

## 2018-08-04 DIAGNOSIS — L89151 Pressure ulcer of sacral region, stage 1: Secondary | ICD-10-CM | POA: Diagnosis present

## 2018-08-04 DIAGNOSIS — E1151 Type 2 diabetes mellitus with diabetic peripheral angiopathy without gangrene: Secondary | ICD-10-CM | POA: Diagnosis not present

## 2018-08-04 DIAGNOSIS — Z4800 Encounter for change or removal of nonsurgical wound dressing: Secondary | ICD-10-CM | POA: Diagnosis not present

## 2018-08-04 DIAGNOSIS — M868X7 Other osteomyelitis, ankle and foot: Secondary | ICD-10-CM | POA: Diagnosis present

## 2018-08-04 DIAGNOSIS — I4430 Unspecified atrioventricular block: Secondary | ICD-10-CM | POA: Diagnosis not present

## 2018-08-04 DIAGNOSIS — L97822 Non-pressure chronic ulcer of other part of left lower leg with fat layer exposed: Secondary | ICD-10-CM | POA: Diagnosis not present

## 2018-08-04 DIAGNOSIS — E43 Unspecified severe protein-calorie malnutrition: Secondary | ICD-10-CM | POA: Diagnosis not present

## 2018-08-04 DIAGNOSIS — B9689 Other specified bacterial agents as the cause of diseases classified elsewhere: Secondary | ICD-10-CM | POA: Diagnosis not present

## 2018-08-04 DIAGNOSIS — Z888 Allergy status to other drugs, medicaments and biological substances status: Secondary | ICD-10-CM | POA: Diagnosis not present

## 2018-08-04 DIAGNOSIS — Z7984 Long term (current) use of oral hypoglycemic drugs: Secondary | ICD-10-CM | POA: Diagnosis not present

## 2018-08-04 DIAGNOSIS — Z89421 Acquired absence of other right toe(s): Secondary | ICD-10-CM | POA: Diagnosis not present

## 2018-08-04 DIAGNOSIS — I2510 Atherosclerotic heart disease of native coronary artery without angina pectoris: Secondary | ICD-10-CM | POA: Diagnosis present

## 2018-08-04 DIAGNOSIS — Z789 Other specified health status: Secondary | ICD-10-CM | POA: Diagnosis not present

## 2018-08-04 DIAGNOSIS — Z833 Family history of diabetes mellitus: Secondary | ICD-10-CM | POA: Diagnosis not present

## 2018-08-04 DIAGNOSIS — M868X6 Other osteomyelitis, lower leg: Secondary | ICD-10-CM | POA: Diagnosis not present

## 2018-08-04 DIAGNOSIS — M86671 Other chronic osteomyelitis, right ankle and foot: Secondary | ICD-10-CM | POA: Diagnosis not present

## 2018-08-04 DIAGNOSIS — M009 Pyogenic arthritis, unspecified: Secondary | ICD-10-CM | POA: Diagnosis not present

## 2018-08-04 DIAGNOSIS — K861 Other chronic pancreatitis: Secondary | ICD-10-CM | POA: Diagnosis not present

## 2018-08-04 DIAGNOSIS — Z8249 Family history of ischemic heart disease and other diseases of the circulatory system: Secondary | ICD-10-CM | POA: Diagnosis not present

## 2018-08-04 DIAGNOSIS — E1159 Type 2 diabetes mellitus with other circulatory complications: Secondary | ICD-10-CM | POA: Diagnosis not present

## 2018-08-04 DIAGNOSIS — K529 Noninfective gastroenteritis and colitis, unspecified: Secondary | ICD-10-CM | POA: Diagnosis not present

## 2018-08-09 DIAGNOSIS — E43 Unspecified severe protein-calorie malnutrition: Secondary | ICD-10-CM | POA: Diagnosis not present

## 2018-08-09 DIAGNOSIS — Z789 Other specified health status: Secondary | ICD-10-CM | POA: Diagnosis not present

## 2018-08-09 DIAGNOSIS — L97822 Non-pressure chronic ulcer of other part of left lower leg with fat layer exposed: Secondary | ICD-10-CM | POA: Diagnosis not present

## 2018-08-09 DIAGNOSIS — C490 Malignant neoplasm of connective and soft tissue of head, face and neck: Secondary | ICD-10-CM | POA: Diagnosis not present

## 2018-08-09 DIAGNOSIS — K8681 Exocrine pancreatic insufficiency: Secondary | ICD-10-CM | POA: Diagnosis not present

## 2018-08-09 DIAGNOSIS — Z4800 Encounter for change or removal of nonsurgical wound dressing: Secondary | ICD-10-CM | POA: Diagnosis not present

## 2018-08-09 DIAGNOSIS — M86671 Other chronic osteomyelitis, right ankle and foot: Secondary | ICD-10-CM | POA: Diagnosis not present

## 2018-08-09 DIAGNOSIS — E1151 Type 2 diabetes mellitus with diabetic peripheral angiopathy without gangrene: Secondary | ICD-10-CM | POA: Diagnosis not present

## 2018-08-10 DIAGNOSIS — Z4800 Encounter for change or removal of nonsurgical wound dressing: Secondary | ICD-10-CM | POA: Diagnosis not present

## 2018-08-10 DIAGNOSIS — C490 Malignant neoplasm of connective and soft tissue of head, face and neck: Secondary | ICD-10-CM | POA: Diagnosis not present

## 2018-08-10 DIAGNOSIS — L97822 Non-pressure chronic ulcer of other part of left lower leg with fat layer exposed: Secondary | ICD-10-CM | POA: Diagnosis not present

## 2018-08-10 DIAGNOSIS — E43 Unspecified severe protein-calorie malnutrition: Secondary | ICD-10-CM | POA: Diagnosis not present

## 2018-08-10 DIAGNOSIS — K8681 Exocrine pancreatic insufficiency: Secondary | ICD-10-CM | POA: Diagnosis not present

## 2018-08-10 DIAGNOSIS — E1151 Type 2 diabetes mellitus with diabetic peripheral angiopathy without gangrene: Secondary | ICD-10-CM | POA: Diagnosis not present

## 2018-08-12 DIAGNOSIS — Z792 Long term (current) use of antibiotics: Secondary | ICD-10-CM | POA: Diagnosis not present

## 2018-08-12 DIAGNOSIS — Z940 Kidney transplant status: Secondary | ICD-10-CM | POA: Diagnosis not present

## 2018-08-12 DIAGNOSIS — M00871 Arthritis due to other bacteria, right ankle and foot: Secondary | ICD-10-CM | POA: Diagnosis not present

## 2018-08-12 DIAGNOSIS — B965 Pseudomonas (aeruginosa) (mallei) (pseudomallei) as the cause of diseases classified elsewhere: Secondary | ICD-10-CM | POA: Diagnosis not present

## 2018-08-12 DIAGNOSIS — M8649 Chronic osteomyelitis with draining sinus, multiple sites: Secondary | ICD-10-CM | POA: Diagnosis not present

## 2018-08-12 DIAGNOSIS — E1151 Type 2 diabetes mellitus with diabetic peripheral angiopathy without gangrene: Secondary | ICD-10-CM | POA: Diagnosis not present

## 2018-08-12 DIAGNOSIS — K8681 Exocrine pancreatic insufficiency: Secondary | ICD-10-CM | POA: Diagnosis not present

## 2018-08-12 DIAGNOSIS — C490 Malignant neoplasm of connective and soft tissue of head, face and neck: Secondary | ICD-10-CM | POA: Diagnosis not present

## 2018-08-12 DIAGNOSIS — Z4800 Encounter for change or removal of nonsurgical wound dressing: Secondary | ICD-10-CM | POA: Diagnosis not present

## 2018-08-12 DIAGNOSIS — Z79899 Other long term (current) drug therapy: Secondary | ICD-10-CM | POA: Diagnosis not present

## 2018-08-12 DIAGNOSIS — M869 Osteomyelitis, unspecified: Secondary | ICD-10-CM | POA: Diagnosis not present

## 2018-08-12 DIAGNOSIS — L97822 Non-pressure chronic ulcer of other part of left lower leg with fat layer exposed: Secondary | ICD-10-CM | POA: Diagnosis not present

## 2018-08-12 DIAGNOSIS — E43 Unspecified severe protein-calorie malnutrition: Secondary | ICD-10-CM | POA: Diagnosis not present

## 2018-08-15 DIAGNOSIS — D899 Disorder involving the immune mechanism, unspecified: Secondary | ICD-10-CM | POA: Diagnosis not present

## 2018-08-15 DIAGNOSIS — Z7952 Long term (current) use of systemic steroids: Secondary | ICD-10-CM | POA: Diagnosis not present

## 2018-08-15 DIAGNOSIS — Z5181 Encounter for therapeutic drug level monitoring: Secondary | ICD-10-CM | POA: Diagnosis not present

## 2018-08-15 DIAGNOSIS — M00871 Arthritis due to other bacteria, right ankle and foot: Secondary | ICD-10-CM | POA: Diagnosis not present

## 2018-08-15 DIAGNOSIS — Z940 Kidney transplant status: Secondary | ICD-10-CM | POA: Diagnosis not present

## 2018-08-15 DIAGNOSIS — M009 Pyogenic arthritis, unspecified: Secondary | ICD-10-CM | POA: Diagnosis not present

## 2018-08-15 DIAGNOSIS — M868X6 Other osteomyelitis, lower leg: Secondary | ICD-10-CM | POA: Diagnosis not present

## 2018-08-15 DIAGNOSIS — Z7901 Long term (current) use of anticoagulants: Secondary | ICD-10-CM | POA: Diagnosis not present

## 2018-08-15 DIAGNOSIS — E119 Type 2 diabetes mellitus without complications: Secondary | ICD-10-CM | POA: Diagnosis not present

## 2018-08-15 DIAGNOSIS — Z79899 Other long term (current) drug therapy: Secondary | ICD-10-CM | POA: Diagnosis not present

## 2018-08-15 DIAGNOSIS — M868X7 Other osteomyelitis, ankle and foot: Secondary | ICD-10-CM | POA: Diagnosis not present

## 2018-08-15 DIAGNOSIS — Z8619 Personal history of other infectious and parasitic diseases: Secondary | ICD-10-CM | POA: Diagnosis not present

## 2018-08-15 DIAGNOSIS — Z792 Long term (current) use of antibiotics: Secondary | ICD-10-CM | POA: Diagnosis not present

## 2018-08-15 DIAGNOSIS — Z89421 Acquired absence of other right toe(s): Secondary | ICD-10-CM | POA: Diagnosis not present

## 2018-08-15 DIAGNOSIS — M869 Osteomyelitis, unspecified: Secondary | ICD-10-CM | POA: Diagnosis not present

## 2018-08-15 DIAGNOSIS — I4891 Unspecified atrial fibrillation: Secondary | ICD-10-CM | POA: Diagnosis not present

## 2018-08-15 DIAGNOSIS — Z9221 Personal history of antineoplastic chemotherapy: Secondary | ICD-10-CM | POA: Diagnosis not present

## 2018-08-15 DIAGNOSIS — B965 Pseudomonas (aeruginosa) (mallei) (pseudomallei) as the cause of diseases classified elsewhere: Secondary | ICD-10-CM | POA: Diagnosis not present

## 2018-08-15 DIAGNOSIS — Z4822 Encounter for aftercare following kidney transplant: Secondary | ICD-10-CM | POA: Diagnosis not present

## 2018-08-16 DIAGNOSIS — I482 Chronic atrial fibrillation, unspecified: Secondary | ICD-10-CM | POA: Diagnosis not present

## 2018-08-16 DIAGNOSIS — N183 Chronic kidney disease, stage 3 (moderate): Secondary | ICD-10-CM | POA: Diagnosis not present

## 2018-08-16 DIAGNOSIS — M8649 Chronic osteomyelitis with draining sinus, multiple sites: Secondary | ICD-10-CM | POA: Diagnosis not present

## 2018-08-16 DIAGNOSIS — Z940 Kidney transplant status: Secondary | ICD-10-CM | POA: Diagnosis not present

## 2018-08-16 DIAGNOSIS — Z452 Encounter for adjustment and management of vascular access device: Secondary | ICD-10-CM | POA: Diagnosis not present

## 2018-08-16 DIAGNOSIS — K8681 Exocrine pancreatic insufficiency: Secondary | ICD-10-CM | POA: Diagnosis not present

## 2018-08-16 DIAGNOSIS — C490 Malignant neoplasm of connective and soft tissue of head, face and neck: Secondary | ICD-10-CM | POA: Diagnosis not present

## 2018-08-16 DIAGNOSIS — M00871 Arthritis due to other bacteria, right ankle and foot: Secondary | ICD-10-CM | POA: Diagnosis not present

## 2018-08-16 DIAGNOSIS — Z5181 Encounter for therapeutic drug level monitoring: Secondary | ICD-10-CM | POA: Diagnosis not present

## 2018-08-16 DIAGNOSIS — Z7984 Long term (current) use of oral hypoglycemic drugs: Secondary | ICD-10-CM | POA: Diagnosis not present

## 2018-08-16 DIAGNOSIS — Z792 Long term (current) use of antibiotics: Secondary | ICD-10-CM | POA: Diagnosis not present

## 2018-08-16 DIAGNOSIS — I2510 Atherosclerotic heart disease of native coronary artery without angina pectoris: Secondary | ICD-10-CM | POA: Diagnosis not present

## 2018-08-16 DIAGNOSIS — I5020 Unspecified systolic (congestive) heart failure: Secondary | ICD-10-CM | POA: Diagnosis not present

## 2018-08-16 DIAGNOSIS — Z79899 Other long term (current) drug therapy: Secondary | ICD-10-CM | POA: Diagnosis not present

## 2018-08-16 DIAGNOSIS — E11621 Type 2 diabetes mellitus with foot ulcer: Secondary | ICD-10-CM | POA: Diagnosis not present

## 2018-08-16 DIAGNOSIS — E1122 Type 2 diabetes mellitus with diabetic chronic kidney disease: Secondary | ICD-10-CM | POA: Diagnosis not present

## 2018-08-16 DIAGNOSIS — D61818 Other pancytopenia: Secondary | ICD-10-CM | POA: Diagnosis not present

## 2018-08-16 DIAGNOSIS — L97406 Non-pressure chronic ulcer of unspecified heel and midfoot with bone involvement without evidence of necrosis: Secondary | ICD-10-CM | POA: Diagnosis not present

## 2018-08-16 DIAGNOSIS — Z7901 Long term (current) use of anticoagulants: Secondary | ICD-10-CM | POA: Diagnosis not present

## 2018-08-16 DIAGNOSIS — L97822 Non-pressure chronic ulcer of other part of left lower leg with fat layer exposed: Secondary | ICD-10-CM | POA: Diagnosis not present

## 2018-08-16 DIAGNOSIS — I110 Hypertensive heart disease with heart failure: Secondary | ICD-10-CM | POA: Diagnosis not present

## 2018-08-16 DIAGNOSIS — E1169 Type 2 diabetes mellitus with other specified complication: Secondary | ICD-10-CM | POA: Diagnosis not present

## 2018-08-16 DIAGNOSIS — B965 Pseudomonas (aeruginosa) (mallei) (pseudomallei) as the cause of diseases classified elsewhere: Secondary | ICD-10-CM | POA: Diagnosis not present

## 2018-08-22 DIAGNOSIS — M8649 Chronic osteomyelitis with draining sinus, multiple sites: Secondary | ICD-10-CM | POA: Diagnosis not present

## 2018-08-22 DIAGNOSIS — E1169 Type 2 diabetes mellitus with other specified complication: Secondary | ICD-10-CM | POA: Diagnosis not present

## 2018-08-22 DIAGNOSIS — M00871 Arthritis due to other bacteria, right ankle and foot: Secondary | ICD-10-CM | POA: Diagnosis not present

## 2018-08-22 DIAGNOSIS — B965 Pseudomonas (aeruginosa) (mallei) (pseudomallei) as the cause of diseases classified elsewhere: Secondary | ICD-10-CM | POA: Diagnosis not present

## 2018-08-22 DIAGNOSIS — L97406 Non-pressure chronic ulcer of unspecified heel and midfoot with bone involvement without evidence of necrosis: Secondary | ICD-10-CM | POA: Diagnosis not present

## 2018-08-22 DIAGNOSIS — M86271 Subacute osteomyelitis, right ankle and foot: Secondary | ICD-10-CM | POA: Diagnosis not present

## 2018-08-22 DIAGNOSIS — E11621 Type 2 diabetes mellitus with foot ulcer: Secondary | ICD-10-CM | POA: Diagnosis not present

## 2018-08-24 DIAGNOSIS — L97312 Non-pressure chronic ulcer of right ankle with fat layer exposed: Secondary | ICD-10-CM | POA: Diagnosis not present

## 2018-08-24 DIAGNOSIS — E11622 Type 2 diabetes mellitus with other skin ulcer: Secondary | ICD-10-CM | POA: Diagnosis not present

## 2018-08-24 DIAGNOSIS — M86271 Subacute osteomyelitis, right ankle and foot: Secondary | ICD-10-CM | POA: Diagnosis not present

## 2018-08-24 DIAGNOSIS — I739 Peripheral vascular disease, unspecified: Secondary | ICD-10-CM | POA: Diagnosis not present

## 2018-08-24 DIAGNOSIS — E46 Unspecified protein-calorie malnutrition: Secondary | ICD-10-CM | POA: Diagnosis not present

## 2018-08-24 DIAGNOSIS — M86671 Other chronic osteomyelitis, right ankle and foot: Secondary | ICD-10-CM | POA: Diagnosis not present

## 2018-08-24 DIAGNOSIS — E1159 Type 2 diabetes mellitus with other circulatory complications: Secondary | ICD-10-CM | POA: Diagnosis not present

## 2018-08-25 DIAGNOSIS — E11621 Type 2 diabetes mellitus with foot ulcer: Secondary | ICD-10-CM | POA: Diagnosis not present

## 2018-08-25 DIAGNOSIS — M8649 Chronic osteomyelitis with draining sinus, multiple sites: Secondary | ICD-10-CM | POA: Diagnosis not present

## 2018-08-25 DIAGNOSIS — E1169 Type 2 diabetes mellitus with other specified complication: Secondary | ICD-10-CM | POA: Diagnosis not present

## 2018-08-25 DIAGNOSIS — M00871 Arthritis due to other bacteria, right ankle and foot: Secondary | ICD-10-CM | POA: Diagnosis not present

## 2018-08-25 DIAGNOSIS — B965 Pseudomonas (aeruginosa) (mallei) (pseudomallei) as the cause of diseases classified elsewhere: Secondary | ICD-10-CM | POA: Diagnosis not present

## 2018-08-25 DIAGNOSIS — L97406 Non-pressure chronic ulcer of unspecified heel and midfoot with bone involvement without evidence of necrosis: Secondary | ICD-10-CM | POA: Diagnosis not present

## 2018-08-26 DIAGNOSIS — C490 Malignant neoplasm of connective and soft tissue of head, face and neck: Secondary | ICD-10-CM | POA: Diagnosis not present

## 2018-08-26 DIAGNOSIS — C434 Malignant melanoma of scalp and neck: Secondary | ICD-10-CM | POA: Diagnosis not present

## 2018-08-26 DIAGNOSIS — C499 Malignant neoplasm of connective and soft tissue, unspecified: Secondary | ICD-10-CM | POA: Diagnosis not present

## 2018-08-26 DIAGNOSIS — C7800 Secondary malignant neoplasm of unspecified lung: Secondary | ICD-10-CM | POA: Diagnosis not present

## 2018-08-26 DIAGNOSIS — Z940 Kidney transplant status: Secondary | ICD-10-CM | POA: Diagnosis not present

## 2018-08-26 DIAGNOSIS — M868X7 Other osteomyelitis, ankle and foot: Secondary | ICD-10-CM | POA: Diagnosis not present

## 2018-08-26 DIAGNOSIS — Z5111 Encounter for antineoplastic chemotherapy: Secondary | ICD-10-CM | POA: Diagnosis not present

## 2018-08-26 DIAGNOSIS — Z79899 Other long term (current) drug therapy: Secondary | ICD-10-CM | POA: Diagnosis not present

## 2018-08-29 DIAGNOSIS — M86671 Other chronic osteomyelitis, right ankle and foot: Secondary | ICD-10-CM | POA: Diagnosis not present

## 2018-08-29 DIAGNOSIS — I739 Peripheral vascular disease, unspecified: Secondary | ICD-10-CM | POA: Diagnosis not present

## 2018-08-29 DIAGNOSIS — E1169 Type 2 diabetes mellitus with other specified complication: Secondary | ICD-10-CM | POA: Diagnosis not present

## 2018-08-29 DIAGNOSIS — M8649 Chronic osteomyelitis with draining sinus, multiple sites: Secondary | ICD-10-CM | POA: Diagnosis not present

## 2018-08-29 DIAGNOSIS — M00871 Arthritis due to other bacteria, right ankle and foot: Secondary | ICD-10-CM | POA: Diagnosis not present

## 2018-08-29 DIAGNOSIS — M86271 Subacute osteomyelitis, right ankle and foot: Secondary | ICD-10-CM | POA: Diagnosis not present

## 2018-08-29 DIAGNOSIS — L97406 Non-pressure chronic ulcer of unspecified heel and midfoot with bone involvement without evidence of necrosis: Secondary | ICD-10-CM | POA: Diagnosis not present

## 2018-08-29 DIAGNOSIS — E11621 Type 2 diabetes mellitus with foot ulcer: Secondary | ICD-10-CM | POA: Diagnosis not present

## 2018-08-29 DIAGNOSIS — B965 Pseudomonas (aeruginosa) (mallei) (pseudomallei) as the cause of diseases classified elsewhere: Secondary | ICD-10-CM | POA: Diagnosis not present

## 2018-08-30 DIAGNOSIS — E1159 Type 2 diabetes mellitus with other circulatory complications: Secondary | ICD-10-CM | POA: Diagnosis not present

## 2018-08-30 DIAGNOSIS — E11622 Type 2 diabetes mellitus with other skin ulcer: Secondary | ICD-10-CM | POA: Diagnosis not present

## 2018-08-30 DIAGNOSIS — M86171 Other acute osteomyelitis, right ankle and foot: Secondary | ICD-10-CM | POA: Diagnosis not present

## 2018-08-30 DIAGNOSIS — E1169 Type 2 diabetes mellitus with other specified complication: Secondary | ICD-10-CM | POA: Diagnosis not present

## 2018-08-30 DIAGNOSIS — M86671 Other chronic osteomyelitis, right ankle and foot: Secondary | ICD-10-CM | POA: Diagnosis not present

## 2018-08-30 DIAGNOSIS — L97511 Non-pressure chronic ulcer of other part of right foot limited to breakdown of skin: Secondary | ICD-10-CM | POA: Diagnosis not present

## 2018-08-30 DIAGNOSIS — L97312 Non-pressure chronic ulcer of right ankle with fat layer exposed: Secondary | ICD-10-CM | POA: Diagnosis not present

## 2018-08-30 DIAGNOSIS — L97322 Non-pressure chronic ulcer of left ankle with fat layer exposed: Secondary | ICD-10-CM | POA: Diagnosis not present

## 2018-08-30 DIAGNOSIS — L97921 Non-pressure chronic ulcer of unspecified part of left lower leg limited to breakdown of skin: Secondary | ICD-10-CM | POA: Diagnosis not present

## 2018-09-01 DIAGNOSIS — E1169 Type 2 diabetes mellitus with other specified complication: Secondary | ICD-10-CM | POA: Diagnosis not present

## 2018-09-01 DIAGNOSIS — M86671 Other chronic osteomyelitis, right ankle and foot: Secondary | ICD-10-CM | POA: Diagnosis not present

## 2018-09-01 DIAGNOSIS — L97406 Non-pressure chronic ulcer of unspecified heel and midfoot with bone involvement without evidence of necrosis: Secondary | ICD-10-CM | POA: Diagnosis not present

## 2018-09-01 DIAGNOSIS — M8649 Chronic osteomyelitis with draining sinus, multiple sites: Secondary | ICD-10-CM | POA: Diagnosis not present

## 2018-09-01 DIAGNOSIS — M00871 Arthritis due to other bacteria, right ankle and foot: Secondary | ICD-10-CM | POA: Diagnosis not present

## 2018-09-01 DIAGNOSIS — B965 Pseudomonas (aeruginosa) (mallei) (pseudomallei) as the cause of diseases classified elsewhere: Secondary | ICD-10-CM | POA: Diagnosis not present

## 2018-09-01 DIAGNOSIS — C7800 Secondary malignant neoplasm of unspecified lung: Secondary | ICD-10-CM | POA: Diagnosis not present

## 2018-09-01 DIAGNOSIS — C4442 Squamous cell carcinoma of skin of scalp and neck: Secondary | ICD-10-CM | POA: Diagnosis not present

## 2018-09-01 DIAGNOSIS — E11621 Type 2 diabetes mellitus with foot ulcer: Secondary | ICD-10-CM | POA: Diagnosis not present

## 2018-09-02 DIAGNOSIS — Z79899 Other long term (current) drug therapy: Secondary | ICD-10-CM | POA: Diagnosis not present

## 2018-09-02 DIAGNOSIS — R410 Disorientation, unspecified: Secondary | ICD-10-CM | POA: Diagnosis not present

## 2018-09-02 DIAGNOSIS — Z940 Kidney transplant status: Secondary | ICD-10-CM | POA: Diagnosis not present

## 2018-09-02 DIAGNOSIS — C490 Malignant neoplasm of connective and soft tissue of head, face and neck: Secondary | ICD-10-CM | POA: Diagnosis not present

## 2018-09-02 DIAGNOSIS — C499 Malignant neoplasm of connective and soft tissue, unspecified: Secondary | ICD-10-CM | POA: Diagnosis not present

## 2018-09-02 DIAGNOSIS — M868X7 Other osteomyelitis, ankle and foot: Secondary | ICD-10-CM | POA: Diagnosis not present

## 2018-09-02 DIAGNOSIS — Z5111 Encounter for antineoplastic chemotherapy: Secondary | ICD-10-CM | POA: Diagnosis not present

## 2018-09-02 DIAGNOSIS — C4440 Unspecified malignant neoplasm of skin of scalp and neck: Secondary | ICD-10-CM | POA: Diagnosis not present

## 2018-09-02 DIAGNOSIS — C7800 Secondary malignant neoplasm of unspecified lung: Secondary | ICD-10-CM | POA: Diagnosis not present

## 2018-09-05 DIAGNOSIS — M86271 Subacute osteomyelitis, right ankle and foot: Secondary | ICD-10-CM | POA: Diagnosis not present

## 2018-09-05 DIAGNOSIS — B965 Pseudomonas (aeruginosa) (mallei) (pseudomallei) as the cause of diseases classified elsewhere: Secondary | ICD-10-CM | POA: Diagnosis not present

## 2018-09-05 DIAGNOSIS — E11621 Type 2 diabetes mellitus with foot ulcer: Secondary | ICD-10-CM | POA: Diagnosis not present

## 2018-09-05 DIAGNOSIS — M00871 Arthritis due to other bacteria, right ankle and foot: Secondary | ICD-10-CM | POA: Diagnosis not present

## 2018-09-05 DIAGNOSIS — M8649 Chronic osteomyelitis with draining sinus, multiple sites: Secondary | ICD-10-CM | POA: Diagnosis not present

## 2018-09-05 DIAGNOSIS — E1169 Type 2 diabetes mellitus with other specified complication: Secondary | ICD-10-CM | POA: Diagnosis not present

## 2018-09-05 DIAGNOSIS — L97406 Non-pressure chronic ulcer of unspecified heel and midfoot with bone involvement without evidence of necrosis: Secondary | ICD-10-CM | POA: Diagnosis not present

## 2018-09-06 DIAGNOSIS — M86671 Other chronic osteomyelitis, right ankle and foot: Secondary | ICD-10-CM | POA: Diagnosis not present

## 2018-09-06 DIAGNOSIS — D61818 Other pancytopenia: Secondary | ICD-10-CM | POA: Diagnosis not present

## 2018-09-06 DIAGNOSIS — M86171 Other acute osteomyelitis, right ankle and foot: Secondary | ICD-10-CM | POA: Diagnosis not present

## 2018-09-06 DIAGNOSIS — E1169 Type 2 diabetes mellitus with other specified complication: Secondary | ICD-10-CM | POA: Diagnosis not present

## 2018-09-06 DIAGNOSIS — E11622 Type 2 diabetes mellitus with other skin ulcer: Secondary | ICD-10-CM | POA: Diagnosis not present

## 2018-09-06 DIAGNOSIS — L97921 Non-pressure chronic ulcer of unspecified part of left lower leg limited to breakdown of skin: Secondary | ICD-10-CM | POA: Diagnosis not present

## 2018-09-06 DIAGNOSIS — L97312 Non-pressure chronic ulcer of right ankle with fat layer exposed: Secondary | ICD-10-CM | POA: Diagnosis not present

## 2018-09-06 DIAGNOSIS — L97322 Non-pressure chronic ulcer of left ankle with fat layer exposed: Secondary | ICD-10-CM | POA: Diagnosis not present

## 2018-09-06 DIAGNOSIS — L97511 Non-pressure chronic ulcer of other part of right foot limited to breakdown of skin: Secondary | ICD-10-CM | POA: Diagnosis not present

## 2018-09-07 DIAGNOSIS — M00871 Arthritis due to other bacteria, right ankle and foot: Secondary | ICD-10-CM | POA: Diagnosis not present

## 2018-09-07 DIAGNOSIS — L97406 Non-pressure chronic ulcer of unspecified heel and midfoot with bone involvement without evidence of necrosis: Secondary | ICD-10-CM | POA: Diagnosis not present

## 2018-09-07 DIAGNOSIS — M8649 Chronic osteomyelitis with draining sinus, multiple sites: Secondary | ICD-10-CM | POA: Diagnosis not present

## 2018-09-07 DIAGNOSIS — E1169 Type 2 diabetes mellitus with other specified complication: Secondary | ICD-10-CM | POA: Diagnosis not present

## 2018-09-07 DIAGNOSIS — B965 Pseudomonas (aeruginosa) (mallei) (pseudomallei) as the cause of diseases classified elsewhere: Secondary | ICD-10-CM | POA: Diagnosis not present

## 2018-09-07 DIAGNOSIS — E11621 Type 2 diabetes mellitus with foot ulcer: Secondary | ICD-10-CM | POA: Diagnosis not present

## 2018-09-09 DIAGNOSIS — C7800 Secondary malignant neoplasm of unspecified lung: Secondary | ICD-10-CM | POA: Diagnosis not present

## 2018-09-09 DIAGNOSIS — Z5111 Encounter for antineoplastic chemotherapy: Secondary | ICD-10-CM | POA: Diagnosis not present

## 2018-09-09 DIAGNOSIS — Z91048 Other nonmedicinal substance allergy status: Secondary | ICD-10-CM | POA: Diagnosis not present

## 2018-09-09 DIAGNOSIS — M868X7 Other osteomyelitis, ankle and foot: Secondary | ICD-10-CM | POA: Diagnosis not present

## 2018-09-09 DIAGNOSIS — Z940 Kidney transplant status: Secondary | ICD-10-CM | POA: Diagnosis not present

## 2018-09-09 DIAGNOSIS — C490 Malignant neoplasm of connective and soft tissue of head, face and neck: Secondary | ICD-10-CM | POA: Diagnosis not present

## 2018-09-13 DIAGNOSIS — M86171 Other acute osteomyelitis, right ankle and foot: Secondary | ICD-10-CM | POA: Diagnosis not present

## 2018-09-13 DIAGNOSIS — E1169 Type 2 diabetes mellitus with other specified complication: Secondary | ICD-10-CM | POA: Diagnosis not present

## 2018-09-13 DIAGNOSIS — M00871 Arthritis due to other bacteria, right ankle and foot: Secondary | ICD-10-CM | POA: Diagnosis not present

## 2018-09-13 DIAGNOSIS — L97511 Non-pressure chronic ulcer of other part of right foot limited to breakdown of skin: Secondary | ICD-10-CM | POA: Diagnosis not present

## 2018-09-13 DIAGNOSIS — B965 Pseudomonas (aeruginosa) (mallei) (pseudomallei) as the cause of diseases classified elsewhere: Secondary | ICD-10-CM | POA: Diagnosis not present

## 2018-09-13 DIAGNOSIS — E11621 Type 2 diabetes mellitus with foot ulcer: Secondary | ICD-10-CM | POA: Diagnosis not present

## 2018-09-13 DIAGNOSIS — L97406 Non-pressure chronic ulcer of unspecified heel and midfoot with bone involvement without evidence of necrosis: Secondary | ICD-10-CM | POA: Diagnosis not present

## 2018-09-13 DIAGNOSIS — M86671 Other chronic osteomyelitis, right ankle and foot: Secondary | ICD-10-CM | POA: Diagnosis not present

## 2018-09-13 DIAGNOSIS — M8649 Chronic osteomyelitis with draining sinus, multiple sites: Secondary | ICD-10-CM | POA: Diagnosis not present

## 2018-09-14 DIAGNOSIS — L97511 Non-pressure chronic ulcer of other part of right foot limited to breakdown of skin: Secondary | ICD-10-CM | POA: Diagnosis not present

## 2018-09-14 DIAGNOSIS — B965 Pseudomonas (aeruginosa) (mallei) (pseudomallei) as the cause of diseases classified elsewhere: Secondary | ICD-10-CM | POA: Diagnosis not present

## 2018-09-14 DIAGNOSIS — E1169 Type 2 diabetes mellitus with other specified complication: Secondary | ICD-10-CM | POA: Diagnosis not present

## 2018-09-14 DIAGNOSIS — M86171 Other acute osteomyelitis, right ankle and foot: Secondary | ICD-10-CM | POA: Diagnosis not present

## 2018-09-14 DIAGNOSIS — M8649 Chronic osteomyelitis with draining sinus, multiple sites: Secondary | ICD-10-CM | POA: Diagnosis not present

## 2018-09-14 DIAGNOSIS — E11621 Type 2 diabetes mellitus with foot ulcer: Secondary | ICD-10-CM | POA: Diagnosis not present

## 2018-09-14 DIAGNOSIS — M86671 Other chronic osteomyelitis, right ankle and foot: Secondary | ICD-10-CM | POA: Diagnosis not present

## 2018-09-14 DIAGNOSIS — I5020 Unspecified systolic (congestive) heart failure: Secondary | ICD-10-CM | POA: Diagnosis not present

## 2018-09-14 DIAGNOSIS — E1151 Type 2 diabetes mellitus with diabetic peripheral angiopathy without gangrene: Secondary | ICD-10-CM | POA: Diagnosis not present

## 2018-09-14 DIAGNOSIS — E43 Unspecified severe protein-calorie malnutrition: Secondary | ICD-10-CM | POA: Diagnosis not present

## 2018-09-14 DIAGNOSIS — L97406 Non-pressure chronic ulcer of unspecified heel and midfoot with bone involvement without evidence of necrosis: Secondary | ICD-10-CM | POA: Diagnosis not present

## 2018-09-14 DIAGNOSIS — M00871 Arthritis due to other bacteria, right ankle and foot: Secondary | ICD-10-CM | POA: Diagnosis not present

## 2018-09-15 DIAGNOSIS — M86171 Other acute osteomyelitis, right ankle and foot: Secondary | ICD-10-CM | POA: Diagnosis not present

## 2018-09-15 DIAGNOSIS — N183 Chronic kidney disease, stage 3 (moderate): Secondary | ICD-10-CM | POA: Diagnosis not present

## 2018-09-15 DIAGNOSIS — Z940 Kidney transplant status: Secondary | ICD-10-CM | POA: Diagnosis not present

## 2018-09-15 DIAGNOSIS — Z5181 Encounter for therapeutic drug level monitoring: Secondary | ICD-10-CM | POA: Diagnosis not present

## 2018-09-15 DIAGNOSIS — Z7984 Long term (current) use of oral hypoglycemic drugs: Secondary | ICD-10-CM | POA: Diagnosis not present

## 2018-09-15 DIAGNOSIS — L97822 Non-pressure chronic ulcer of other part of left lower leg with fat layer exposed: Secondary | ICD-10-CM | POA: Diagnosis not present

## 2018-09-15 DIAGNOSIS — Z452 Encounter for adjustment and management of vascular access device: Secondary | ICD-10-CM | POA: Diagnosis not present

## 2018-09-15 DIAGNOSIS — M86671 Other chronic osteomyelitis, right ankle and foot: Secondary | ICD-10-CM | POA: Diagnosis not present

## 2018-09-15 DIAGNOSIS — M00871 Arthritis due to other bacteria, right ankle and foot: Secondary | ICD-10-CM | POA: Diagnosis not present

## 2018-09-15 DIAGNOSIS — D61818 Other pancytopenia: Secondary | ICD-10-CM | POA: Diagnosis not present

## 2018-09-15 DIAGNOSIS — E1169 Type 2 diabetes mellitus with other specified complication: Secondary | ICD-10-CM | POA: Diagnosis not present

## 2018-09-15 DIAGNOSIS — Z79899 Other long term (current) drug therapy: Secondary | ICD-10-CM | POA: Diagnosis not present

## 2018-09-15 DIAGNOSIS — M8649 Chronic osteomyelitis with draining sinus, multiple sites: Secondary | ICD-10-CM | POA: Diagnosis not present

## 2018-09-15 DIAGNOSIS — I5020 Unspecified systolic (congestive) heart failure: Secondary | ICD-10-CM | POA: Diagnosis not present

## 2018-09-15 DIAGNOSIS — B965 Pseudomonas (aeruginosa) (mallei) (pseudomallei) as the cause of diseases classified elsewhere: Secondary | ICD-10-CM | POA: Diagnosis not present

## 2018-09-15 DIAGNOSIS — K8681 Exocrine pancreatic insufficiency: Secondary | ICD-10-CM | POA: Diagnosis not present

## 2018-09-15 DIAGNOSIS — L97511 Non-pressure chronic ulcer of other part of right foot limited to breakdown of skin: Secondary | ICD-10-CM | POA: Diagnosis not present

## 2018-09-15 DIAGNOSIS — C490 Malignant neoplasm of connective and soft tissue of head, face and neck: Secondary | ICD-10-CM | POA: Diagnosis not present

## 2018-09-15 DIAGNOSIS — I110 Hypertensive heart disease with heart failure: Secondary | ICD-10-CM | POA: Diagnosis not present

## 2018-09-15 DIAGNOSIS — E1122 Type 2 diabetes mellitus with diabetic chronic kidney disease: Secondary | ICD-10-CM | POA: Diagnosis not present

## 2018-09-15 DIAGNOSIS — I482 Chronic atrial fibrillation, unspecified: Secondary | ICD-10-CM | POA: Diagnosis not present

## 2018-09-15 DIAGNOSIS — Z792 Long term (current) use of antibiotics: Secondary | ICD-10-CM | POA: Diagnosis not present

## 2018-09-16 DIAGNOSIS — Z940 Kidney transplant status: Secondary | ICD-10-CM | POA: Diagnosis not present

## 2018-09-16 DIAGNOSIS — I428 Other cardiomyopathies: Secondary | ICD-10-CM | POA: Diagnosis not present

## 2018-09-16 DIAGNOSIS — I5022 Chronic systolic (congestive) heart failure: Secondary | ICD-10-CM | POA: Diagnosis not present

## 2018-09-16 DIAGNOSIS — N183 Chronic kidney disease, stage 3 (moderate): Secondary | ICD-10-CM | POA: Diagnosis not present

## 2018-09-16 DIAGNOSIS — L97511 Non-pressure chronic ulcer of other part of right foot limited to breakdown of skin: Secondary | ICD-10-CM | POA: Diagnosis not present

## 2018-09-16 DIAGNOSIS — I482 Chronic atrial fibrillation, unspecified: Secondary | ICD-10-CM | POA: Diagnosis not present

## 2018-09-16 DIAGNOSIS — I130 Hypertensive heart and chronic kidney disease with heart failure and stage 1 through stage 4 chronic kidney disease, or unspecified chronic kidney disease: Secondary | ICD-10-CM | POA: Diagnosis not present

## 2018-09-16 DIAGNOSIS — M86171 Other acute osteomyelitis, right ankle and foot: Secondary | ICD-10-CM | POA: Diagnosis not present

## 2018-09-16 DIAGNOSIS — I739 Peripheral vascular disease, unspecified: Secondary | ICD-10-CM | POA: Diagnosis not present

## 2018-09-16 DIAGNOSIS — I2510 Atherosclerotic heart disease of native coronary artery without angina pectoris: Secondary | ICD-10-CM | POA: Diagnosis not present

## 2018-09-16 DIAGNOSIS — M86671 Other chronic osteomyelitis, right ankle and foot: Secondary | ICD-10-CM | POA: Diagnosis not present

## 2018-09-19 DIAGNOSIS — C4440 Unspecified malignant neoplasm of skin of scalp and neck: Secondary | ICD-10-CM | POA: Diagnosis not present

## 2018-09-19 DIAGNOSIS — C7801 Secondary malignant neoplasm of right lung: Secondary | ICD-10-CM | POA: Diagnosis not present

## 2018-09-19 DIAGNOSIS — L97822 Non-pressure chronic ulcer of other part of left lower leg with fat layer exposed: Secondary | ICD-10-CM | POA: Diagnosis not present

## 2018-09-19 DIAGNOSIS — Z79899 Other long term (current) drug therapy: Secondary | ICD-10-CM | POA: Diagnosis not present

## 2018-09-19 DIAGNOSIS — M86671 Other chronic osteomyelitis, right ankle and foot: Secondary | ICD-10-CM | POA: Diagnosis not present

## 2018-09-19 DIAGNOSIS — Z452 Encounter for adjustment and management of vascular access device: Secondary | ICD-10-CM | POA: Diagnosis not present

## 2018-09-19 DIAGNOSIS — E1142 Type 2 diabetes mellitus with diabetic polyneuropathy: Secondary | ICD-10-CM | POA: Diagnosis not present

## 2018-09-19 DIAGNOSIS — L97511 Non-pressure chronic ulcer of other part of right foot limited to breakdown of skin: Secondary | ICD-10-CM | POA: Diagnosis not present

## 2018-09-19 DIAGNOSIS — L97519 Non-pressure chronic ulcer of other part of right foot with unspecified severity: Secondary | ICD-10-CM | POA: Diagnosis not present

## 2018-09-19 DIAGNOSIS — M00871 Arthritis due to other bacteria, right ankle and foot: Secondary | ICD-10-CM | POA: Diagnosis not present

## 2018-09-19 DIAGNOSIS — R110 Nausea: Secondary | ICD-10-CM | POA: Diagnosis not present

## 2018-09-19 DIAGNOSIS — E11621 Type 2 diabetes mellitus with foot ulcer: Secondary | ICD-10-CM | POA: Diagnosis not present

## 2018-09-19 DIAGNOSIS — C490 Malignant neoplasm of connective and soft tissue of head, face and neck: Secondary | ICD-10-CM | POA: Diagnosis not present

## 2018-09-19 DIAGNOSIS — B965 Pseudomonas (aeruginosa) (mallei) (pseudomallei) as the cause of diseases classified elsewhere: Secondary | ICD-10-CM | POA: Diagnosis not present

## 2018-09-19 DIAGNOSIS — M8649 Chronic osteomyelitis with draining sinus, multiple sites: Secondary | ICD-10-CM | POA: Diagnosis not present

## 2018-09-19 DIAGNOSIS — E1169 Type 2 diabetes mellitus with other specified complication: Secondary | ICD-10-CM | POA: Diagnosis not present

## 2018-09-20 DIAGNOSIS — M86271 Subacute osteomyelitis, right ankle and foot: Secondary | ICD-10-CM | POA: Diagnosis not present

## 2018-09-20 DIAGNOSIS — M19072 Primary osteoarthritis, left ankle and foot: Secondary | ICD-10-CM | POA: Diagnosis not present

## 2018-09-20 DIAGNOSIS — E1169 Type 2 diabetes mellitus with other specified complication: Secondary | ICD-10-CM | POA: Diagnosis not present

## 2018-09-20 DIAGNOSIS — L97511 Non-pressure chronic ulcer of other part of right foot limited to breakdown of skin: Secondary | ICD-10-CM | POA: Diagnosis not present

## 2018-09-20 DIAGNOSIS — R110 Nausea: Secondary | ICD-10-CM | POA: Diagnosis not present

## 2018-09-20 DIAGNOSIS — M86671 Other chronic osteomyelitis, right ankle and foot: Secondary | ICD-10-CM | POA: Diagnosis not present

## 2018-09-20 DIAGNOSIS — L97822 Non-pressure chronic ulcer of other part of left lower leg with fat layer exposed: Secondary | ICD-10-CM | POA: Diagnosis not present

## 2018-09-20 DIAGNOSIS — C4440 Unspecified malignant neoplasm of skin of scalp and neck: Secondary | ICD-10-CM | POA: Diagnosis not present

## 2018-09-20 DIAGNOSIS — C7801 Secondary malignant neoplasm of right lung: Secondary | ICD-10-CM | POA: Diagnosis not present

## 2018-09-20 DIAGNOSIS — Z79899 Other long term (current) drug therapy: Secondary | ICD-10-CM | POA: Diagnosis not present

## 2018-09-20 DIAGNOSIS — E11622 Type 2 diabetes mellitus with other skin ulcer: Secondary | ICD-10-CM | POA: Diagnosis not present

## 2018-09-20 DIAGNOSIS — L97312 Non-pressure chronic ulcer of right ankle with fat layer exposed: Secondary | ICD-10-CM | POA: Diagnosis not present

## 2018-09-21 DIAGNOSIS — L97511 Non-pressure chronic ulcer of other part of right foot limited to breakdown of skin: Secondary | ICD-10-CM | POA: Diagnosis not present

## 2018-09-21 DIAGNOSIS — C7801 Secondary malignant neoplasm of right lung: Secondary | ICD-10-CM | POA: Diagnosis not present

## 2018-09-21 DIAGNOSIS — R110 Nausea: Secondary | ICD-10-CM | POA: Diagnosis not present

## 2018-09-21 DIAGNOSIS — C4440 Unspecified malignant neoplasm of skin of scalp and neck: Secondary | ICD-10-CM | POA: Diagnosis not present

## 2018-09-21 DIAGNOSIS — Z79899 Other long term (current) drug therapy: Secondary | ICD-10-CM | POA: Diagnosis not present

## 2018-09-21 DIAGNOSIS — M86671 Other chronic osteomyelitis, right ankle and foot: Secondary | ICD-10-CM | POA: Diagnosis not present

## 2018-09-22 DIAGNOSIS — C7801 Secondary malignant neoplasm of right lung: Secondary | ICD-10-CM | POA: Diagnosis not present

## 2018-09-22 DIAGNOSIS — M86671 Other chronic osteomyelitis, right ankle and foot: Secondary | ICD-10-CM | POA: Diagnosis not present

## 2018-09-22 DIAGNOSIS — M86171 Other acute osteomyelitis, right ankle and foot: Secondary | ICD-10-CM | POA: Diagnosis not present

## 2018-09-22 DIAGNOSIS — C4440 Unspecified malignant neoplasm of skin of scalp and neck: Secondary | ICD-10-CM | POA: Diagnosis not present

## 2018-09-22 DIAGNOSIS — Z79899 Other long term (current) drug therapy: Secondary | ICD-10-CM | POA: Diagnosis not present

## 2018-09-22 DIAGNOSIS — L97511 Non-pressure chronic ulcer of other part of right foot limited to breakdown of skin: Secondary | ICD-10-CM | POA: Diagnosis not present

## 2018-09-22 DIAGNOSIS — R110 Nausea: Secondary | ICD-10-CM | POA: Diagnosis not present

## 2018-09-23 DIAGNOSIS — E1122 Type 2 diabetes mellitus with diabetic chronic kidney disease: Secondary | ICD-10-CM | POA: Diagnosis not present

## 2018-09-23 DIAGNOSIS — Z940 Kidney transplant status: Secondary | ICD-10-CM | POA: Diagnosis not present

## 2018-09-23 DIAGNOSIS — M86671 Other chronic osteomyelitis, right ankle and foot: Secondary | ICD-10-CM | POA: Diagnosis not present

## 2018-09-23 DIAGNOSIS — I482 Chronic atrial fibrillation, unspecified: Secondary | ICD-10-CM | POA: Diagnosis not present

## 2018-09-23 DIAGNOSIS — Z79899 Other long term (current) drug therapy: Secondary | ICD-10-CM | POA: Diagnosis not present

## 2018-09-23 DIAGNOSIS — I739 Peripheral vascular disease, unspecified: Secondary | ICD-10-CM | POA: Diagnosis not present

## 2018-09-23 DIAGNOSIS — Z5111 Encounter for antineoplastic chemotherapy: Secondary | ICD-10-CM | POA: Diagnosis not present

## 2018-09-23 DIAGNOSIS — C490 Malignant neoplasm of connective and soft tissue of head, face and neck: Secondary | ICD-10-CM | POA: Diagnosis not present

## 2018-09-23 DIAGNOSIS — I471 Supraventricular tachycardia: Secondary | ICD-10-CM | POA: Diagnosis not present

## 2018-09-23 DIAGNOSIS — K8689 Other specified diseases of pancreas: Secondary | ICD-10-CM | POA: Diagnosis not present

## 2018-09-23 DIAGNOSIS — C7801 Secondary malignant neoplasm of right lung: Secondary | ICD-10-CM | POA: Diagnosis not present

## 2018-09-23 DIAGNOSIS — L97511 Non-pressure chronic ulcer of other part of right foot limited to breakdown of skin: Secondary | ICD-10-CM | POA: Diagnosis not present

## 2018-09-23 DIAGNOSIS — R110 Nausea: Secondary | ICD-10-CM | POA: Diagnosis not present

## 2018-09-23 DIAGNOSIS — I130 Hypertensive heart and chronic kidney disease with heart failure and stage 1 through stage 4 chronic kidney disease, or unspecified chronic kidney disease: Secondary | ICD-10-CM | POA: Diagnosis not present

## 2018-09-23 DIAGNOSIS — C4440 Unspecified malignant neoplasm of skin of scalp and neck: Secondary | ICD-10-CM | POA: Diagnosis not present

## 2018-09-23 DIAGNOSIS — H903 Sensorineural hearing loss, bilateral: Secondary | ICD-10-CM | POA: Diagnosis not present

## 2018-09-23 DIAGNOSIS — C7800 Secondary malignant neoplasm of unspecified lung: Secondary | ICD-10-CM | POA: Diagnosis not present

## 2018-09-23 DIAGNOSIS — N183 Chronic kidney disease, stage 3 (moderate): Secondary | ICD-10-CM | POA: Diagnosis not present

## 2018-09-26 DIAGNOSIS — R110 Nausea: Secondary | ICD-10-CM | POA: Diagnosis not present

## 2018-09-26 DIAGNOSIS — M86671 Other chronic osteomyelitis, right ankle and foot: Secondary | ICD-10-CM | POA: Diagnosis not present

## 2018-09-26 DIAGNOSIS — C7801 Secondary malignant neoplasm of right lung: Secondary | ICD-10-CM | POA: Diagnosis not present

## 2018-09-26 DIAGNOSIS — L97511 Non-pressure chronic ulcer of other part of right foot limited to breakdown of skin: Secondary | ICD-10-CM | POA: Diagnosis not present

## 2018-09-26 DIAGNOSIS — Z79899 Other long term (current) drug therapy: Secondary | ICD-10-CM | POA: Diagnosis not present

## 2018-09-26 DIAGNOSIS — C4440 Unspecified malignant neoplasm of skin of scalp and neck: Secondary | ICD-10-CM | POA: Diagnosis not present

## 2018-09-27 DIAGNOSIS — Z940 Kidney transplant status: Secondary | ICD-10-CM | POA: Diagnosis not present

## 2018-09-27 DIAGNOSIS — M86671 Other chronic osteomyelitis, right ankle and foot: Secondary | ICD-10-CM | POA: Diagnosis not present

## 2018-09-27 DIAGNOSIS — Z79899 Other long term (current) drug therapy: Secondary | ICD-10-CM | POA: Diagnosis not present

## 2018-09-27 DIAGNOSIS — L97511 Non-pressure chronic ulcer of other part of right foot limited to breakdown of skin: Secondary | ICD-10-CM | POA: Diagnosis not present

## 2018-09-27 DIAGNOSIS — I9589 Other hypotension: Secondary | ICD-10-CM | POA: Diagnosis not present

## 2018-09-27 DIAGNOSIS — Z7901 Long term (current) use of anticoagulants: Secondary | ICD-10-CM | POA: Diagnosis not present

## 2018-09-27 DIAGNOSIS — C7801 Secondary malignant neoplasm of right lung: Secondary | ICD-10-CM | POA: Diagnosis not present

## 2018-09-27 DIAGNOSIS — Z7952 Long term (current) use of systemic steroids: Secondary | ICD-10-CM | POA: Diagnosis not present

## 2018-09-27 DIAGNOSIS — N183 Chronic kidney disease, stage 3 (moderate): Secondary | ICD-10-CM | POA: Diagnosis not present

## 2018-09-27 DIAGNOSIS — C4440 Unspecified malignant neoplasm of skin of scalp and neck: Secondary | ICD-10-CM | POA: Diagnosis not present

## 2018-09-27 DIAGNOSIS — I4819 Other persistent atrial fibrillation: Secondary | ICD-10-CM | POA: Diagnosis not present

## 2018-09-27 DIAGNOSIS — I4891 Unspecified atrial fibrillation: Secondary | ICD-10-CM | POA: Diagnosis not present

## 2018-09-27 DIAGNOSIS — C7800 Secondary malignant neoplasm of unspecified lung: Secondary | ICD-10-CM | POA: Diagnosis not present

## 2018-09-27 DIAGNOSIS — R110 Nausea: Secondary | ICD-10-CM | POA: Diagnosis not present

## 2018-09-28 DIAGNOSIS — Z79899 Other long term (current) drug therapy: Secondary | ICD-10-CM | POA: Diagnosis not present

## 2018-09-28 DIAGNOSIS — M86671 Other chronic osteomyelitis, right ankle and foot: Secondary | ICD-10-CM | POA: Diagnosis not present

## 2018-09-28 DIAGNOSIS — C4440 Unspecified malignant neoplasm of skin of scalp and neck: Secondary | ICD-10-CM | POA: Diagnosis not present

## 2018-09-28 DIAGNOSIS — L97511 Non-pressure chronic ulcer of other part of right foot limited to breakdown of skin: Secondary | ICD-10-CM | POA: Diagnosis not present

## 2018-09-28 DIAGNOSIS — R110 Nausea: Secondary | ICD-10-CM | POA: Diagnosis not present

## 2018-09-28 DIAGNOSIS — C7801 Secondary malignant neoplasm of right lung: Secondary | ICD-10-CM | POA: Diagnosis not present

## 2018-09-29 DIAGNOSIS — M86671 Other chronic osteomyelitis, right ankle and foot: Secondary | ICD-10-CM | POA: Diagnosis not present

## 2018-09-29 DIAGNOSIS — M8649 Chronic osteomyelitis with draining sinus, multiple sites: Secondary | ICD-10-CM | POA: Diagnosis not present

## 2018-09-29 DIAGNOSIS — C7801 Secondary malignant neoplasm of right lung: Secondary | ICD-10-CM | POA: Diagnosis not present

## 2018-09-29 DIAGNOSIS — B965 Pseudomonas (aeruginosa) (mallei) (pseudomallei) as the cause of diseases classified elsewhere: Secondary | ICD-10-CM | POA: Diagnosis not present

## 2018-09-29 DIAGNOSIS — Z79899 Other long term (current) drug therapy: Secondary | ICD-10-CM | POA: Diagnosis not present

## 2018-09-29 DIAGNOSIS — E1169 Type 2 diabetes mellitus with other specified complication: Secondary | ICD-10-CM | POA: Diagnosis not present

## 2018-09-29 DIAGNOSIS — C4440 Unspecified malignant neoplasm of skin of scalp and neck: Secondary | ICD-10-CM | POA: Diagnosis not present

## 2018-09-29 DIAGNOSIS — L97511 Non-pressure chronic ulcer of other part of right foot limited to breakdown of skin: Secondary | ICD-10-CM | POA: Diagnosis not present

## 2018-09-29 DIAGNOSIS — C490 Malignant neoplasm of connective and soft tissue of head, face and neck: Secondary | ICD-10-CM | POA: Diagnosis not present

## 2018-09-29 DIAGNOSIS — L97822 Non-pressure chronic ulcer of other part of left lower leg with fat layer exposed: Secondary | ICD-10-CM | POA: Diagnosis not present

## 2018-09-29 DIAGNOSIS — M00871 Arthritis due to other bacteria, right ankle and foot: Secondary | ICD-10-CM | POA: Diagnosis not present

## 2018-09-29 DIAGNOSIS — R110 Nausea: Secondary | ICD-10-CM | POA: Diagnosis not present

## 2018-09-30 DIAGNOSIS — C4440 Unspecified malignant neoplasm of skin of scalp and neck: Secondary | ICD-10-CM | POA: Diagnosis not present

## 2018-09-30 DIAGNOSIS — M86271 Subacute osteomyelitis, right ankle and foot: Secondary | ICD-10-CM | POA: Diagnosis not present

## 2018-09-30 DIAGNOSIS — M86671 Other chronic osteomyelitis, right ankle and foot: Secondary | ICD-10-CM | POA: Diagnosis not present

## 2018-09-30 DIAGNOSIS — C7800 Secondary malignant neoplasm of unspecified lung: Secondary | ICD-10-CM | POA: Diagnosis not present

## 2018-09-30 DIAGNOSIS — L97511 Non-pressure chronic ulcer of other part of right foot limited to breakdown of skin: Secondary | ICD-10-CM | POA: Diagnosis not present

## 2018-09-30 DIAGNOSIS — C490 Malignant neoplasm of connective and soft tissue of head, face and neck: Secondary | ICD-10-CM | POA: Diagnosis not present

## 2018-09-30 DIAGNOSIS — C7801 Secondary malignant neoplasm of right lung: Secondary | ICD-10-CM | POA: Diagnosis not present

## 2018-09-30 DIAGNOSIS — Z79899 Other long term (current) drug therapy: Secondary | ICD-10-CM | POA: Diagnosis not present

## 2018-09-30 DIAGNOSIS — R110 Nausea: Secondary | ICD-10-CM | POA: Diagnosis not present

## 2018-09-30 DIAGNOSIS — Z5111 Encounter for antineoplastic chemotherapy: Secondary | ICD-10-CM | POA: Diagnosis not present

## 2018-10-03 DIAGNOSIS — M86671 Other chronic osteomyelitis, right ankle and foot: Secondary | ICD-10-CM | POA: Diagnosis not present

## 2018-10-03 DIAGNOSIS — Z79899 Other long term (current) drug therapy: Secondary | ICD-10-CM | POA: Diagnosis not present

## 2018-10-03 DIAGNOSIS — C7801 Secondary malignant neoplasm of right lung: Secondary | ICD-10-CM | POA: Diagnosis not present

## 2018-10-03 DIAGNOSIS — L97511 Non-pressure chronic ulcer of other part of right foot limited to breakdown of skin: Secondary | ICD-10-CM | POA: Diagnosis not present

## 2018-10-03 DIAGNOSIS — C4440 Unspecified malignant neoplasm of skin of scalp and neck: Secondary | ICD-10-CM | POA: Diagnosis not present

## 2018-10-03 DIAGNOSIS — R110 Nausea: Secondary | ICD-10-CM | POA: Diagnosis not present

## 2018-10-04 DIAGNOSIS — L97511 Non-pressure chronic ulcer of other part of right foot limited to breakdown of skin: Secondary | ICD-10-CM | POA: Diagnosis not present

## 2018-10-04 DIAGNOSIS — Z79899 Other long term (current) drug therapy: Secondary | ICD-10-CM | POA: Diagnosis not present

## 2018-10-04 DIAGNOSIS — C7801 Secondary malignant neoplasm of right lung: Secondary | ICD-10-CM | POA: Diagnosis not present

## 2018-10-04 DIAGNOSIS — C4440 Unspecified malignant neoplasm of skin of scalp and neck: Secondary | ICD-10-CM | POA: Diagnosis not present

## 2018-10-04 DIAGNOSIS — M86671 Other chronic osteomyelitis, right ankle and foot: Secondary | ICD-10-CM | POA: Diagnosis not present

## 2018-10-04 DIAGNOSIS — R110 Nausea: Secondary | ICD-10-CM | POA: Diagnosis not present

## 2018-10-05 DIAGNOSIS — L97312 Non-pressure chronic ulcer of right ankle with fat layer exposed: Secondary | ICD-10-CM | POA: Diagnosis not present

## 2018-10-05 DIAGNOSIS — C4440 Unspecified malignant neoplasm of skin of scalp and neck: Secondary | ICD-10-CM | POA: Diagnosis not present

## 2018-10-05 DIAGNOSIS — E11621 Type 2 diabetes mellitus with foot ulcer: Secondary | ICD-10-CM | POA: Diagnosis not present

## 2018-10-05 DIAGNOSIS — C7801 Secondary malignant neoplasm of right lung: Secondary | ICD-10-CM | POA: Diagnosis not present

## 2018-10-05 DIAGNOSIS — M86671 Other chronic osteomyelitis, right ankle and foot: Secondary | ICD-10-CM | POA: Diagnosis not present

## 2018-10-05 DIAGNOSIS — E11622 Type 2 diabetes mellitus with other skin ulcer: Secondary | ICD-10-CM | POA: Diagnosis not present

## 2018-10-05 DIAGNOSIS — E1159 Type 2 diabetes mellitus with other circulatory complications: Secondary | ICD-10-CM | POA: Diagnosis not present

## 2018-10-05 DIAGNOSIS — L97822 Non-pressure chronic ulcer of other part of left lower leg with fat layer exposed: Secondary | ICD-10-CM | POA: Diagnosis not present

## 2018-10-05 DIAGNOSIS — Z79899 Other long term (current) drug therapy: Secondary | ICD-10-CM | POA: Diagnosis not present

## 2018-10-05 DIAGNOSIS — R110 Nausea: Secondary | ICD-10-CM | POA: Diagnosis not present

## 2018-10-05 DIAGNOSIS — E1169 Type 2 diabetes mellitus with other specified complication: Secondary | ICD-10-CM | POA: Diagnosis not present

## 2018-10-05 DIAGNOSIS — L97411 Non-pressure chronic ulcer of right heel and midfoot limited to breakdown of skin: Secondary | ICD-10-CM | POA: Diagnosis not present

## 2018-10-05 DIAGNOSIS — L97511 Non-pressure chronic ulcer of other part of right foot limited to breakdown of skin: Secondary | ICD-10-CM | POA: Diagnosis not present

## 2018-10-06 DIAGNOSIS — M86671 Other chronic osteomyelitis, right ankle and foot: Secondary | ICD-10-CM | POA: Diagnosis not present

## 2018-10-06 DIAGNOSIS — R5383 Other fatigue: Secondary | ICD-10-CM | POA: Diagnosis not present

## 2018-10-06 DIAGNOSIS — L97511 Non-pressure chronic ulcer of other part of right foot limited to breakdown of skin: Secondary | ICD-10-CM | POA: Diagnosis not present

## 2018-10-06 DIAGNOSIS — I4819 Other persistent atrial fibrillation: Secondary | ICD-10-CM | POA: Diagnosis not present

## 2018-10-06 DIAGNOSIS — F329 Major depressive disorder, single episode, unspecified: Secondary | ICD-10-CM | POA: Diagnosis present

## 2018-10-06 DIAGNOSIS — R918 Other nonspecific abnormal finding of lung field: Secondary | ICD-10-CM | POA: Diagnosis not present

## 2018-10-06 DIAGNOSIS — M19071 Primary osteoarthritis, right ankle and foot: Secondary | ICD-10-CM | POA: Diagnosis not present

## 2018-10-06 DIAGNOSIS — Z7984 Long term (current) use of oral hypoglycemic drugs: Secondary | ICD-10-CM | POA: Diagnosis not present

## 2018-10-06 DIAGNOSIS — L97312 Non-pressure chronic ulcer of right ankle with fat layer exposed: Secondary | ICD-10-CM | POA: Diagnosis not present

## 2018-10-06 DIAGNOSIS — R531 Weakness: Secondary | ICD-10-CM | POA: Diagnosis not present

## 2018-10-06 DIAGNOSIS — F419 Anxiety disorder, unspecified: Secondary | ICD-10-CM | POA: Diagnosis present

## 2018-10-06 DIAGNOSIS — E11621 Type 2 diabetes mellitus with foot ulcer: Secondary | ICD-10-CM | POA: Diagnosis not present

## 2018-10-06 DIAGNOSIS — G4730 Sleep apnea, unspecified: Secondary | ICD-10-CM | POA: Diagnosis not present

## 2018-10-06 DIAGNOSIS — I4430 Unspecified atrioventricular block: Secondary | ICD-10-CM | POA: Diagnosis present

## 2018-10-06 DIAGNOSIS — E1169 Type 2 diabetes mellitus with other specified complication: Secondary | ICD-10-CM | POA: Diagnosis not present

## 2018-10-06 DIAGNOSIS — I4892 Unspecified atrial flutter: Secondary | ICD-10-CM | POA: Diagnosis present

## 2018-10-06 DIAGNOSIS — L03115 Cellulitis of right lower limb: Secondary | ICD-10-CM | POA: Diagnosis present

## 2018-10-06 DIAGNOSIS — Z7952 Long term (current) use of systemic steroids: Secondary | ICD-10-CM | POA: Diagnosis not present

## 2018-10-06 DIAGNOSIS — Z7901 Long term (current) use of anticoagulants: Secondary | ICD-10-CM | POA: Diagnosis not present

## 2018-10-06 DIAGNOSIS — E785 Hyperlipidemia, unspecified: Secondary | ICD-10-CM | POA: Diagnosis not present

## 2018-10-06 DIAGNOSIS — N400 Enlarged prostate without lower urinary tract symptoms: Secondary | ICD-10-CM | POA: Diagnosis present

## 2018-10-06 DIAGNOSIS — M2141 Flat foot [pes planus] (acquired), right foot: Secondary | ICD-10-CM | POA: Diagnosis not present

## 2018-10-06 DIAGNOSIS — Z833 Family history of diabetes mellitus: Secondary | ICD-10-CM | POA: Diagnosis not present

## 2018-10-06 DIAGNOSIS — E1122 Type 2 diabetes mellitus with diabetic chronic kidney disease: Secondary | ICD-10-CM | POA: Diagnosis present

## 2018-10-06 DIAGNOSIS — I129 Hypertensive chronic kidney disease with stage 1 through stage 4 chronic kidney disease, or unspecified chronic kidney disease: Secondary | ICD-10-CM | POA: Diagnosis not present

## 2018-10-06 DIAGNOSIS — E11622 Type 2 diabetes mellitus with other skin ulcer: Secondary | ICD-10-CM | POA: Diagnosis not present

## 2018-10-06 DIAGNOSIS — N183 Chronic kidney disease, stage 3 (moderate): Secondary | ICD-10-CM | POA: Diagnosis present

## 2018-10-07 DIAGNOSIS — C7800 Secondary malignant neoplasm of unspecified lung: Secondary | ICD-10-CM | POA: Diagnosis not present

## 2018-10-07 DIAGNOSIS — M868X7 Other osteomyelitis, ankle and foot: Secondary | ICD-10-CM | POA: Diagnosis not present

## 2018-10-07 DIAGNOSIS — I4819 Other persistent atrial fibrillation: Secondary | ICD-10-CM | POA: Diagnosis not present

## 2018-10-07 DIAGNOSIS — M86671 Other chronic osteomyelitis, right ankle and foot: Secondary | ICD-10-CM | POA: Diagnosis not present

## 2018-10-07 DIAGNOSIS — Z5111 Encounter for antineoplastic chemotherapy: Secondary | ICD-10-CM | POA: Diagnosis not present

## 2018-10-07 DIAGNOSIS — C7801 Secondary malignant neoplasm of right lung: Secondary | ICD-10-CM | POA: Diagnosis not present

## 2018-10-07 DIAGNOSIS — Z940 Kidney transplant status: Secondary | ICD-10-CM | POA: Diagnosis not present

## 2018-10-07 DIAGNOSIS — I4892 Unspecified atrial flutter: Secondary | ICD-10-CM | POA: Diagnosis not present

## 2018-10-07 DIAGNOSIS — C490 Malignant neoplasm of connective and soft tissue of head, face and neck: Secondary | ICD-10-CM | POA: Diagnosis not present

## 2018-10-07 DIAGNOSIS — R110 Nausea: Secondary | ICD-10-CM | POA: Diagnosis not present

## 2018-10-07 DIAGNOSIS — R000 Tachycardia, unspecified: Secondary | ICD-10-CM | POA: Diagnosis not present

## 2018-10-13 DIAGNOSIS — L97421 Non-pressure chronic ulcer of left heel and midfoot limited to breakdown of skin: Secondary | ICD-10-CM | POA: Diagnosis not present

## 2018-10-13 DIAGNOSIS — L97511 Non-pressure chronic ulcer of other part of right foot limited to breakdown of skin: Secondary | ICD-10-CM | POA: Diagnosis not present

## 2018-10-13 DIAGNOSIS — Z940 Kidney transplant status: Secondary | ICD-10-CM | POA: Diagnosis not present

## 2018-10-13 DIAGNOSIS — R110 Nausea: Secondary | ICD-10-CM | POA: Diagnosis not present

## 2018-10-13 DIAGNOSIS — L97519 Non-pressure chronic ulcer of other part of right foot with unspecified severity: Secondary | ICD-10-CM | POA: Diagnosis not present

## 2018-10-13 DIAGNOSIS — Z79899 Other long term (current) drug therapy: Secondary | ICD-10-CM | POA: Diagnosis not present

## 2018-10-13 DIAGNOSIS — C4440 Unspecified malignant neoplasm of skin of scalp and neck: Secondary | ICD-10-CM | POA: Diagnosis not present

## 2018-10-13 DIAGNOSIS — E1142 Type 2 diabetes mellitus with diabetic polyneuropathy: Secondary | ICD-10-CM | POA: Diagnosis not present

## 2018-10-13 DIAGNOSIS — L97312 Non-pressure chronic ulcer of right ankle with fat layer exposed: Secondary | ICD-10-CM | POA: Diagnosis not present

## 2018-10-13 DIAGNOSIS — M86271 Subacute osteomyelitis, right ankle and foot: Secondary | ICD-10-CM | POA: Diagnosis not present

## 2018-10-13 DIAGNOSIS — M86671 Other chronic osteomyelitis, right ankle and foot: Secondary | ICD-10-CM | POA: Diagnosis not present

## 2018-10-13 DIAGNOSIS — C7801 Secondary malignant neoplasm of right lung: Secondary | ICD-10-CM | POA: Diagnosis not present

## 2018-10-13 DIAGNOSIS — E11621 Type 2 diabetes mellitus with foot ulcer: Secondary | ICD-10-CM | POA: Diagnosis not present

## 2018-10-13 DIAGNOSIS — Z452 Encounter for adjustment and management of vascular access device: Secondary | ICD-10-CM | POA: Diagnosis not present

## 2018-10-13 DIAGNOSIS — L97512 Non-pressure chronic ulcer of other part of right foot with fat layer exposed: Secondary | ICD-10-CM | POA: Diagnosis not present

## 2018-10-15 DIAGNOSIS — E1122 Type 2 diabetes mellitus with diabetic chronic kidney disease: Secondary | ICD-10-CM | POA: Diagnosis not present

## 2018-10-15 DIAGNOSIS — D61818 Other pancytopenia: Secondary | ICD-10-CM | POA: Diagnosis not present

## 2018-10-15 DIAGNOSIS — Z79899 Other long term (current) drug therapy: Secondary | ICD-10-CM | POA: Diagnosis not present

## 2018-10-15 DIAGNOSIS — M8649 Chronic osteomyelitis with draining sinus, multiple sites: Secondary | ICD-10-CM | POA: Diagnosis not present

## 2018-10-15 DIAGNOSIS — Z792 Long term (current) use of antibiotics: Secondary | ICD-10-CM | POA: Diagnosis not present

## 2018-10-15 DIAGNOSIS — I5020 Unspecified systolic (congestive) heart failure: Secondary | ICD-10-CM | POA: Diagnosis not present

## 2018-10-15 DIAGNOSIS — I110 Hypertensive heart disease with heart failure: Secondary | ICD-10-CM | POA: Diagnosis not present

## 2018-10-15 DIAGNOSIS — I482 Chronic atrial fibrillation, unspecified: Secondary | ICD-10-CM | POA: Diagnosis not present

## 2018-10-15 DIAGNOSIS — Z5181 Encounter for therapeutic drug level monitoring: Secondary | ICD-10-CM | POA: Diagnosis not present

## 2018-10-15 DIAGNOSIS — Z7984 Long term (current) use of oral hypoglycemic drugs: Secondary | ICD-10-CM | POA: Diagnosis not present

## 2018-10-15 DIAGNOSIS — L97822 Non-pressure chronic ulcer of other part of left lower leg with fat layer exposed: Secondary | ICD-10-CM | POA: Diagnosis not present

## 2018-10-15 DIAGNOSIS — M00871 Arthritis due to other bacteria, right ankle and foot: Secondary | ICD-10-CM | POA: Diagnosis not present

## 2018-10-15 DIAGNOSIS — E1169 Type 2 diabetes mellitus with other specified complication: Secondary | ICD-10-CM | POA: Diagnosis not present

## 2018-10-15 DIAGNOSIS — B965 Pseudomonas (aeruginosa) (mallei) (pseudomallei) as the cause of diseases classified elsewhere: Secondary | ICD-10-CM | POA: Diagnosis not present

## 2018-10-15 DIAGNOSIS — C490 Malignant neoplasm of connective and soft tissue of head, face and neck: Secondary | ICD-10-CM | POA: Diagnosis not present

## 2018-10-15 DIAGNOSIS — K8681 Exocrine pancreatic insufficiency: Secondary | ICD-10-CM | POA: Diagnosis not present

## 2018-10-15 DIAGNOSIS — Z452 Encounter for adjustment and management of vascular access device: Secondary | ICD-10-CM | POA: Diagnosis not present

## 2018-10-15 DIAGNOSIS — N183 Chronic kidney disease, stage 3 (moderate): Secondary | ICD-10-CM | POA: Diagnosis not present

## 2018-10-15 DIAGNOSIS — Z940 Kidney transplant status: Secondary | ICD-10-CM | POA: Diagnosis not present

## 2018-10-17 DIAGNOSIS — E1142 Type 2 diabetes mellitus with diabetic polyneuropathy: Secondary | ICD-10-CM | POA: Diagnosis not present

## 2018-10-17 DIAGNOSIS — C7801 Secondary malignant neoplasm of right lung: Secondary | ICD-10-CM | POA: Diagnosis not present

## 2018-10-17 DIAGNOSIS — L97519 Non-pressure chronic ulcer of other part of right foot with unspecified severity: Secondary | ICD-10-CM | POA: Diagnosis not present

## 2018-10-17 DIAGNOSIS — Z79899 Other long term (current) drug therapy: Secondary | ICD-10-CM | POA: Diagnosis not present

## 2018-10-17 DIAGNOSIS — Z452 Encounter for adjustment and management of vascular access device: Secondary | ICD-10-CM | POA: Diagnosis not present

## 2018-10-17 DIAGNOSIS — E11621 Type 2 diabetes mellitus with foot ulcer: Secondary | ICD-10-CM | POA: Diagnosis not present

## 2018-10-17 DIAGNOSIS — R110 Nausea: Secondary | ICD-10-CM | POA: Diagnosis not present

## 2018-10-17 DIAGNOSIS — L97511 Non-pressure chronic ulcer of other part of right foot limited to breakdown of skin: Secondary | ICD-10-CM | POA: Diagnosis not present

## 2018-10-17 DIAGNOSIS — M86671 Other chronic osteomyelitis, right ankle and foot: Secondary | ICD-10-CM | POA: Diagnosis not present

## 2018-10-17 DIAGNOSIS — C4440 Unspecified malignant neoplasm of skin of scalp and neck: Secondary | ICD-10-CM | POA: Diagnosis not present

## 2018-10-18 DIAGNOSIS — M8649 Chronic osteomyelitis with draining sinus, multiple sites: Secondary | ICD-10-CM | POA: Diagnosis not present

## 2018-10-18 DIAGNOSIS — L97511 Non-pressure chronic ulcer of other part of right foot limited to breakdown of skin: Secondary | ICD-10-CM | POA: Diagnosis not present

## 2018-10-18 DIAGNOSIS — C4440 Unspecified malignant neoplasm of skin of scalp and neck: Secondary | ICD-10-CM | POA: Diagnosis not present

## 2018-10-18 DIAGNOSIS — E1169 Type 2 diabetes mellitus with other specified complication: Secondary | ICD-10-CM | POA: Diagnosis not present

## 2018-10-18 DIAGNOSIS — B965 Pseudomonas (aeruginosa) (mallei) (pseudomallei) as the cause of diseases classified elsewhere: Secondary | ICD-10-CM | POA: Diagnosis not present

## 2018-10-18 DIAGNOSIS — Z79899 Other long term (current) drug therapy: Secondary | ICD-10-CM | POA: Diagnosis not present

## 2018-10-18 DIAGNOSIS — R110 Nausea: Secondary | ICD-10-CM | POA: Diagnosis not present

## 2018-10-18 DIAGNOSIS — M86671 Other chronic osteomyelitis, right ankle and foot: Secondary | ICD-10-CM | POA: Diagnosis not present

## 2018-10-18 DIAGNOSIS — C7801 Secondary malignant neoplasm of right lung: Secondary | ICD-10-CM | POA: Diagnosis not present

## 2018-10-18 DIAGNOSIS — L97822 Non-pressure chronic ulcer of other part of left lower leg with fat layer exposed: Secondary | ICD-10-CM | POA: Diagnosis not present

## 2018-10-18 DIAGNOSIS — M00871 Arthritis due to other bacteria, right ankle and foot: Secondary | ICD-10-CM | POA: Diagnosis not present

## 2018-10-18 DIAGNOSIS — C490 Malignant neoplasm of connective and soft tissue of head, face and neck: Secondary | ICD-10-CM | POA: Diagnosis not present

## 2018-10-19 DIAGNOSIS — E11622 Type 2 diabetes mellitus with other skin ulcer: Secondary | ICD-10-CM | POA: Diagnosis not present

## 2018-10-19 DIAGNOSIS — N261 Atrophy of kidney (terminal): Secondary | ICD-10-CM | POA: Diagnosis not present

## 2018-10-19 DIAGNOSIS — C499 Malignant neoplasm of connective and soft tissue, unspecified: Secondary | ICD-10-CM | POA: Diagnosis not present

## 2018-10-19 DIAGNOSIS — Z5111 Encounter for antineoplastic chemotherapy: Secondary | ICD-10-CM | POA: Diagnosis not present

## 2018-10-19 DIAGNOSIS — Z940 Kidney transplant status: Secondary | ICD-10-CM | POA: Diagnosis not present

## 2018-10-19 DIAGNOSIS — R188 Other ascites: Secondary | ICD-10-CM | POA: Diagnosis not present

## 2018-10-19 DIAGNOSIS — K8689 Other specified diseases of pancreas: Secondary | ICD-10-CM | POA: Diagnosis not present

## 2018-10-19 DIAGNOSIS — Z79899 Other long term (current) drug therapy: Secondary | ICD-10-CM | POA: Diagnosis not present

## 2018-10-19 DIAGNOSIS — D61818 Other pancytopenia: Secondary | ICD-10-CM | POA: Diagnosis not present

## 2018-10-19 DIAGNOSIS — L97821 Non-pressure chronic ulcer of other part of left lower leg limited to breakdown of skin: Secondary | ICD-10-CM | POA: Diagnosis not present

## 2018-10-19 DIAGNOSIS — R161 Splenomegaly, not elsewhere classified: Secondary | ICD-10-CM | POA: Diagnosis not present

## 2018-10-19 DIAGNOSIS — R590 Localized enlarged lymph nodes: Secondary | ICD-10-CM | POA: Diagnosis not present

## 2018-10-19 DIAGNOSIS — E1169 Type 2 diabetes mellitus with other specified complication: Secondary | ICD-10-CM | POA: Diagnosis not present

## 2018-10-19 DIAGNOSIS — E11621 Type 2 diabetes mellitus with foot ulcer: Secondary | ICD-10-CM | POA: Diagnosis not present

## 2018-10-19 DIAGNOSIS — M86671 Other chronic osteomyelitis, right ankle and foot: Secondary | ICD-10-CM | POA: Diagnosis not present

## 2018-10-19 DIAGNOSIS — L97311 Non-pressure chronic ulcer of right ankle limited to breakdown of skin: Secondary | ICD-10-CM | POA: Diagnosis not present

## 2018-10-19 DIAGNOSIS — M25571 Pain in right ankle and joints of right foot: Secondary | ICD-10-CM | POA: Diagnosis not present

## 2018-10-19 DIAGNOSIS — C7800 Secondary malignant neoplasm of unspecified lung: Secondary | ICD-10-CM | POA: Diagnosis not present

## 2018-10-19 DIAGNOSIS — I739 Peripheral vascular disease, unspecified: Secondary | ICD-10-CM | POA: Diagnosis not present

## 2018-10-19 DIAGNOSIS — L97512 Non-pressure chronic ulcer of other part of right foot with fat layer exposed: Secondary | ICD-10-CM | POA: Diagnosis not present

## 2018-10-20 DIAGNOSIS — D61818 Other pancytopenia: Secondary | ICD-10-CM | POA: Diagnosis not present

## 2018-10-20 DIAGNOSIS — C7800 Secondary malignant neoplasm of unspecified lung: Secondary | ICD-10-CM | POA: Diagnosis not present

## 2018-10-20 DIAGNOSIS — M86671 Other chronic osteomyelitis, right ankle and foot: Secondary | ICD-10-CM | POA: Diagnosis not present

## 2018-10-20 DIAGNOSIS — Z5111 Encounter for antineoplastic chemotherapy: Secondary | ICD-10-CM | POA: Diagnosis not present

## 2018-10-20 DIAGNOSIS — C499 Malignant neoplasm of connective and soft tissue, unspecified: Secondary | ICD-10-CM | POA: Diagnosis not present

## 2018-10-20 DIAGNOSIS — R590 Localized enlarged lymph nodes: Secondary | ICD-10-CM | POA: Diagnosis not present

## 2018-10-24 DIAGNOSIS — Z8781 Personal history of (healed) traumatic fracture: Secondary | ICD-10-CM | POA: Diagnosis not present

## 2018-10-24 DIAGNOSIS — R590 Localized enlarged lymph nodes: Secondary | ICD-10-CM | POA: Diagnosis not present

## 2018-10-24 DIAGNOSIS — C499 Malignant neoplasm of connective and soft tissue, unspecified: Secondary | ICD-10-CM | POA: Diagnosis not present

## 2018-10-24 DIAGNOSIS — Z89431 Acquired absence of right foot: Secondary | ICD-10-CM | POA: Diagnosis not present

## 2018-10-24 DIAGNOSIS — C7800 Secondary malignant neoplasm of unspecified lung: Secondary | ICD-10-CM | POA: Diagnosis not present

## 2018-10-24 DIAGNOSIS — M19071 Primary osteoarthritis, right ankle and foot: Secondary | ICD-10-CM | POA: Diagnosis not present

## 2018-10-24 DIAGNOSIS — Z5111 Encounter for antineoplastic chemotherapy: Secondary | ICD-10-CM | POA: Diagnosis not present

## 2018-10-24 DIAGNOSIS — M86271 Subacute osteomyelitis, right ankle and foot: Secondary | ICD-10-CM | POA: Diagnosis not present

## 2018-10-24 DIAGNOSIS — D61818 Other pancytopenia: Secondary | ICD-10-CM | POA: Diagnosis not present

## 2018-10-24 DIAGNOSIS — M86671 Other chronic osteomyelitis, right ankle and foot: Secondary | ICD-10-CM | POA: Diagnosis not present

## 2018-10-25 DIAGNOSIS — R590 Localized enlarged lymph nodes: Secondary | ICD-10-CM | POA: Diagnosis not present

## 2018-10-25 DIAGNOSIS — C7800 Secondary malignant neoplasm of unspecified lung: Secondary | ICD-10-CM | POA: Diagnosis not present

## 2018-10-25 DIAGNOSIS — Z5111 Encounter for antineoplastic chemotherapy: Secondary | ICD-10-CM | POA: Diagnosis not present

## 2018-10-25 DIAGNOSIS — C499 Malignant neoplasm of connective and soft tissue, unspecified: Secondary | ICD-10-CM | POA: Diagnosis not present

## 2018-10-25 DIAGNOSIS — D61818 Other pancytopenia: Secondary | ICD-10-CM | POA: Diagnosis not present

## 2018-10-25 DIAGNOSIS — M86671 Other chronic osteomyelitis, right ankle and foot: Secondary | ICD-10-CM | POA: Diagnosis not present

## 2018-10-26 DIAGNOSIS — M86671 Other chronic osteomyelitis, right ankle and foot: Secondary | ICD-10-CM | POA: Diagnosis not present

## 2018-10-26 DIAGNOSIS — C7800 Secondary malignant neoplasm of unspecified lung: Secondary | ICD-10-CM | POA: Diagnosis not present

## 2018-10-26 DIAGNOSIS — D61818 Other pancytopenia: Secondary | ICD-10-CM | POA: Diagnosis not present

## 2018-10-26 DIAGNOSIS — R590 Localized enlarged lymph nodes: Secondary | ICD-10-CM | POA: Diagnosis not present

## 2018-10-26 DIAGNOSIS — Z5111 Encounter for antineoplastic chemotherapy: Secondary | ICD-10-CM | POA: Diagnosis not present

## 2018-10-26 DIAGNOSIS — C499 Malignant neoplasm of connective and soft tissue, unspecified: Secondary | ICD-10-CM | POA: Diagnosis not present

## 2018-10-27 DIAGNOSIS — C499 Malignant neoplasm of connective and soft tissue, unspecified: Secondary | ICD-10-CM | POA: Diagnosis not present

## 2018-10-27 DIAGNOSIS — C7800 Secondary malignant neoplasm of unspecified lung: Secondary | ICD-10-CM | POA: Diagnosis not present

## 2018-10-27 DIAGNOSIS — M86671 Other chronic osteomyelitis, right ankle and foot: Secondary | ICD-10-CM | POA: Diagnosis not present

## 2018-10-27 DIAGNOSIS — D61818 Other pancytopenia: Secondary | ICD-10-CM | POA: Diagnosis not present

## 2018-10-27 DIAGNOSIS — Z5111 Encounter for antineoplastic chemotherapy: Secondary | ICD-10-CM | POA: Diagnosis not present

## 2018-10-27 DIAGNOSIS — R590 Localized enlarged lymph nodes: Secondary | ICD-10-CM | POA: Diagnosis not present

## 2018-10-27 DIAGNOSIS — C490 Malignant neoplasm of connective and soft tissue of head, face and neck: Secondary | ICD-10-CM | POA: Diagnosis not present

## 2018-10-28 DIAGNOSIS — Z5111 Encounter for antineoplastic chemotherapy: Secondary | ICD-10-CM | POA: Diagnosis not present

## 2018-10-28 DIAGNOSIS — Y998 Other external cause status: Secondary | ICD-10-CM | POA: Diagnosis not present

## 2018-10-28 DIAGNOSIS — C7800 Secondary malignant neoplasm of unspecified lung: Secondary | ICD-10-CM | POA: Diagnosis not present

## 2018-10-28 DIAGNOSIS — R410 Disorientation, unspecified: Secondary | ICD-10-CM | POA: Diagnosis not present

## 2018-10-28 DIAGNOSIS — R110 Nausea: Secondary | ICD-10-CM | POA: Diagnosis not present

## 2018-10-28 DIAGNOSIS — T801XXA Vascular complications following infusion, transfusion and therapeutic injection, initial encounter: Secondary | ICD-10-CM | POA: Diagnosis not present

## 2018-10-28 DIAGNOSIS — M868X7 Other osteomyelitis, ankle and foot: Secondary | ICD-10-CM | POA: Diagnosis not present

## 2018-10-28 DIAGNOSIS — M869 Osteomyelitis, unspecified: Secondary | ICD-10-CM | POA: Diagnosis not present

## 2018-10-28 DIAGNOSIS — Z79899 Other long term (current) drug therapy: Secondary | ICD-10-CM | POA: Diagnosis not present

## 2018-10-28 DIAGNOSIS — C490 Malignant neoplasm of connective and soft tissue of head, face and neck: Secondary | ICD-10-CM | POA: Diagnosis not present

## 2018-10-28 DIAGNOSIS — T450X5D Adverse effect of antiallergic and antiemetic drugs, subsequent encounter: Secondary | ICD-10-CM | POA: Diagnosis not present

## 2018-10-28 DIAGNOSIS — X58XXXA Exposure to other specified factors, initial encounter: Secondary | ICD-10-CM | POA: Diagnosis not present

## 2018-10-30 DIAGNOSIS — I4892 Unspecified atrial flutter: Secondary | ICD-10-CM | POA: Diagnosis not present

## 2018-10-30 DIAGNOSIS — I4430 Unspecified atrioventricular block: Secondary | ICD-10-CM | POA: Diagnosis not present

## 2018-10-31 DIAGNOSIS — R188 Other ascites: Secondary | ICD-10-CM | POA: Diagnosis not present

## 2018-10-31 DIAGNOSIS — C499 Malignant neoplasm of connective and soft tissue, unspecified: Secondary | ICD-10-CM | POA: Diagnosis not present

## 2018-10-31 DIAGNOSIS — M00871 Arthritis due to other bacteria, right ankle and foot: Secondary | ICD-10-CM | POA: Diagnosis not present

## 2018-10-31 DIAGNOSIS — M25571 Pain in right ankle and joints of right foot: Secondary | ICD-10-CM | POA: Diagnosis not present

## 2018-10-31 DIAGNOSIS — M8649 Chronic osteomyelitis with draining sinus, multiple sites: Secondary | ICD-10-CM | POA: Diagnosis not present

## 2018-10-31 DIAGNOSIS — D61818 Other pancytopenia: Secondary | ICD-10-CM | POA: Diagnosis not present

## 2018-10-31 DIAGNOSIS — C490 Malignant neoplasm of connective and soft tissue of head, face and neck: Secondary | ICD-10-CM | POA: Diagnosis not present

## 2018-10-31 DIAGNOSIS — Z940 Kidney transplant status: Secondary | ICD-10-CM | POA: Diagnosis not present

## 2018-10-31 DIAGNOSIS — R161 Splenomegaly, not elsewhere classified: Secondary | ICD-10-CM | POA: Diagnosis not present

## 2018-10-31 DIAGNOSIS — C7800 Secondary malignant neoplasm of unspecified lung: Secondary | ICD-10-CM | POA: Diagnosis not present

## 2018-10-31 DIAGNOSIS — Z79899 Other long term (current) drug therapy: Secondary | ICD-10-CM | POA: Diagnosis not present

## 2018-10-31 DIAGNOSIS — M86671 Other chronic osteomyelitis, right ankle and foot: Secondary | ICD-10-CM | POA: Diagnosis not present

## 2018-10-31 DIAGNOSIS — E1169 Type 2 diabetes mellitus with other specified complication: Secondary | ICD-10-CM | POA: Diagnosis not present

## 2018-10-31 DIAGNOSIS — K8689 Other specified diseases of pancreas: Secondary | ICD-10-CM | POA: Diagnosis not present

## 2018-10-31 DIAGNOSIS — Z5111 Encounter for antineoplastic chemotherapy: Secondary | ICD-10-CM | POA: Diagnosis not present

## 2018-10-31 DIAGNOSIS — L97822 Non-pressure chronic ulcer of other part of left lower leg with fat layer exposed: Secondary | ICD-10-CM | POA: Diagnosis not present

## 2018-10-31 DIAGNOSIS — B965 Pseudomonas (aeruginosa) (mallei) (pseudomallei) as the cause of diseases classified elsewhere: Secondary | ICD-10-CM | POA: Diagnosis not present

## 2018-10-31 DIAGNOSIS — N261 Atrophy of kidney (terminal): Secondary | ICD-10-CM | POA: Diagnosis not present

## 2018-10-31 DIAGNOSIS — R590 Localized enlarged lymph nodes: Secondary | ICD-10-CM | POA: Diagnosis not present

## 2018-11-01 DIAGNOSIS — R590 Localized enlarged lymph nodes: Secondary | ICD-10-CM | POA: Diagnosis not present

## 2018-11-01 DIAGNOSIS — D61818 Other pancytopenia: Secondary | ICD-10-CM | POA: Diagnosis not present

## 2018-11-01 DIAGNOSIS — M86671 Other chronic osteomyelitis, right ankle and foot: Secondary | ICD-10-CM | POA: Diagnosis not present

## 2018-11-01 DIAGNOSIS — C499 Malignant neoplasm of connective and soft tissue, unspecified: Secondary | ICD-10-CM | POA: Diagnosis not present

## 2018-11-01 DIAGNOSIS — Z5111 Encounter for antineoplastic chemotherapy: Secondary | ICD-10-CM | POA: Diagnosis not present

## 2018-11-01 DIAGNOSIS — C7800 Secondary malignant neoplasm of unspecified lung: Secondary | ICD-10-CM | POA: Diagnosis not present

## 2018-11-03 DIAGNOSIS — C7800 Secondary malignant neoplasm of unspecified lung: Secondary | ICD-10-CM | POA: Diagnosis not present

## 2018-11-03 DIAGNOSIS — Z5111 Encounter for antineoplastic chemotherapy: Secondary | ICD-10-CM | POA: Diagnosis not present

## 2018-11-03 DIAGNOSIS — D61818 Other pancytopenia: Secondary | ICD-10-CM | POA: Diagnosis not present

## 2018-11-03 DIAGNOSIS — R590 Localized enlarged lymph nodes: Secondary | ICD-10-CM | POA: Diagnosis not present

## 2018-11-03 DIAGNOSIS — C499 Malignant neoplasm of connective and soft tissue, unspecified: Secondary | ICD-10-CM | POA: Diagnosis not present

## 2018-11-03 DIAGNOSIS — M86671 Other chronic osteomyelitis, right ankle and foot: Secondary | ICD-10-CM | POA: Diagnosis not present

## 2018-11-04 DIAGNOSIS — B029 Zoster without complications: Secondary | ICD-10-CM | POA: Diagnosis not present

## 2018-11-04 DIAGNOSIS — C490 Malignant neoplasm of connective and soft tissue of head, face and neck: Secondary | ICD-10-CM | POA: Diagnosis not present

## 2018-11-04 DIAGNOSIS — C7800 Secondary malignant neoplasm of unspecified lung: Secondary | ICD-10-CM | POA: Diagnosis not present

## 2018-11-04 DIAGNOSIS — R590 Localized enlarged lymph nodes: Secondary | ICD-10-CM | POA: Diagnosis not present

## 2018-11-04 DIAGNOSIS — Z5111 Encounter for antineoplastic chemotherapy: Secondary | ICD-10-CM | POA: Diagnosis not present

## 2018-11-04 DIAGNOSIS — D61818 Other pancytopenia: Secondary | ICD-10-CM | POA: Diagnosis not present

## 2018-11-04 DIAGNOSIS — C499 Malignant neoplasm of connective and soft tissue, unspecified: Secondary | ICD-10-CM | POA: Diagnosis not present

## 2018-11-04 DIAGNOSIS — M86671 Other chronic osteomyelitis, right ankle and foot: Secondary | ICD-10-CM | POA: Diagnosis not present

## 2018-11-07 DIAGNOSIS — N401 Enlarged prostate with lower urinary tract symptoms: Secondary | ICD-10-CM | POA: Diagnosis not present

## 2018-11-07 DIAGNOSIS — R590 Localized enlarged lymph nodes: Secondary | ICD-10-CM | POA: Diagnosis not present

## 2018-11-07 DIAGNOSIS — N138 Other obstructive and reflux uropathy: Secondary | ICD-10-CM | POA: Diagnosis not present

## 2018-11-07 DIAGNOSIS — Z5111 Encounter for antineoplastic chemotherapy: Secondary | ICD-10-CM | POA: Diagnosis not present

## 2018-11-07 DIAGNOSIS — C490 Malignant neoplasm of connective and soft tissue of head, face and neck: Secondary | ICD-10-CM | POA: Diagnosis not present

## 2018-11-07 DIAGNOSIS — C7800 Secondary malignant neoplasm of unspecified lung: Secondary | ICD-10-CM | POA: Diagnosis not present

## 2018-11-07 DIAGNOSIS — B028 Zoster with other complications: Secondary | ICD-10-CM | POA: Diagnosis not present

## 2018-11-07 DIAGNOSIS — M86671 Other chronic osteomyelitis, right ankle and foot: Secondary | ICD-10-CM | POA: Diagnosis not present

## 2018-11-07 DIAGNOSIS — D61818 Other pancytopenia: Secondary | ICD-10-CM | POA: Diagnosis not present

## 2018-11-07 DIAGNOSIS — K8689 Other specified diseases of pancreas: Secondary | ICD-10-CM | POA: Diagnosis not present

## 2018-11-07 DIAGNOSIS — C499 Malignant neoplasm of connective and soft tissue, unspecified: Secondary | ICD-10-CM | POA: Diagnosis not present

## 2018-11-07 DIAGNOSIS — E1151 Type 2 diabetes mellitus with diabetic peripheral angiopathy without gangrene: Secondary | ICD-10-CM | POA: Diagnosis not present

## 2018-11-07 DIAGNOSIS — N183 Chronic kidney disease, stage 3 (moderate): Secondary | ICD-10-CM | POA: Diagnosis not present

## 2018-11-08 DIAGNOSIS — M86671 Other chronic osteomyelitis, right ankle and foot: Secondary | ICD-10-CM | POA: Diagnosis not present

## 2018-11-08 DIAGNOSIS — C7800 Secondary malignant neoplasm of unspecified lung: Secondary | ICD-10-CM | POA: Diagnosis not present

## 2018-11-08 DIAGNOSIS — Z5111 Encounter for antineoplastic chemotherapy: Secondary | ICD-10-CM | POA: Diagnosis not present

## 2018-11-08 DIAGNOSIS — C499 Malignant neoplasm of connective and soft tissue, unspecified: Secondary | ICD-10-CM | POA: Diagnosis not present

## 2018-11-08 DIAGNOSIS — D61818 Other pancytopenia: Secondary | ICD-10-CM | POA: Diagnosis not present

## 2018-11-08 DIAGNOSIS — R590 Localized enlarged lymph nodes: Secondary | ICD-10-CM | POA: Diagnosis not present

## 2018-11-09 DIAGNOSIS — Z5111 Encounter for antineoplastic chemotherapy: Secondary | ICD-10-CM | POA: Diagnosis not present

## 2018-11-09 DIAGNOSIS — M86671 Other chronic osteomyelitis, right ankle and foot: Secondary | ICD-10-CM | POA: Diagnosis not present

## 2018-11-09 DIAGNOSIS — D61818 Other pancytopenia: Secondary | ICD-10-CM | POA: Diagnosis not present

## 2018-11-09 DIAGNOSIS — C499 Malignant neoplasm of connective and soft tissue, unspecified: Secondary | ICD-10-CM | POA: Diagnosis not present

## 2018-11-09 DIAGNOSIS — C7800 Secondary malignant neoplasm of unspecified lung: Secondary | ICD-10-CM | POA: Diagnosis not present

## 2018-11-09 DIAGNOSIS — R590 Localized enlarged lymph nodes: Secondary | ICD-10-CM | POA: Diagnosis not present

## 2018-11-10 DIAGNOSIS — C499 Malignant neoplasm of connective and soft tissue, unspecified: Secondary | ICD-10-CM | POA: Diagnosis not present

## 2018-11-10 DIAGNOSIS — M86671 Other chronic osteomyelitis, right ankle and foot: Secondary | ICD-10-CM | POA: Diagnosis not present

## 2018-11-10 DIAGNOSIS — Z5111 Encounter for antineoplastic chemotherapy: Secondary | ICD-10-CM | POA: Diagnosis not present

## 2018-11-10 DIAGNOSIS — C7800 Secondary malignant neoplasm of unspecified lung: Secondary | ICD-10-CM | POA: Diagnosis not present

## 2018-11-10 DIAGNOSIS — E11621 Type 2 diabetes mellitus with foot ulcer: Secondary | ICD-10-CM | POA: Diagnosis not present

## 2018-11-10 DIAGNOSIS — D61818 Other pancytopenia: Secondary | ICD-10-CM | POA: Diagnosis not present

## 2018-11-10 DIAGNOSIS — R590 Localized enlarged lymph nodes: Secondary | ICD-10-CM | POA: Diagnosis not present

## 2018-11-10 DIAGNOSIS — L97522 Non-pressure chronic ulcer of other part of left foot with fat layer exposed: Secondary | ICD-10-CM | POA: Diagnosis not present

## 2018-11-11 DIAGNOSIS — B029 Zoster without complications: Secondary | ICD-10-CM | POA: Diagnosis not present

## 2018-11-11 DIAGNOSIS — R609 Edema, unspecified: Secondary | ICD-10-CM | POA: Diagnosis not present

## 2018-11-11 DIAGNOSIS — C490 Malignant neoplasm of connective and soft tissue of head, face and neck: Secondary | ICD-10-CM | POA: Diagnosis not present

## 2018-11-11 DIAGNOSIS — Z5111 Encounter for antineoplastic chemotherapy: Secondary | ICD-10-CM | POA: Diagnosis not present

## 2018-11-11 DIAGNOSIS — M869 Osteomyelitis, unspecified: Secondary | ICD-10-CM | POA: Diagnosis not present

## 2018-11-11 DIAGNOSIS — M86671 Other chronic osteomyelitis, right ankle and foot: Secondary | ICD-10-CM | POA: Diagnosis not present

## 2018-11-11 DIAGNOSIS — R110 Nausea: Secondary | ICD-10-CM | POA: Diagnosis not present

## 2018-11-11 DIAGNOSIS — B028 Zoster with other complications: Secondary | ICD-10-CM | POA: Diagnosis not present

## 2018-11-11 DIAGNOSIS — C7800 Secondary malignant neoplasm of unspecified lung: Secondary | ICD-10-CM | POA: Diagnosis not present

## 2018-11-11 DIAGNOSIS — Z79899 Other long term (current) drug therapy: Secondary | ICD-10-CM | POA: Diagnosis not present

## 2018-11-14 DIAGNOSIS — M25571 Pain in right ankle and joints of right foot: Secondary | ICD-10-CM | POA: Diagnosis not present

## 2018-11-14 DIAGNOSIS — Z940 Kidney transplant status: Secondary | ICD-10-CM | POA: Diagnosis not present

## 2018-11-14 DIAGNOSIS — M86671 Other chronic osteomyelitis, right ankle and foot: Secondary | ICD-10-CM | POA: Diagnosis not present

## 2018-11-14 DIAGNOSIS — R188 Other ascites: Secondary | ICD-10-CM | POA: Diagnosis not present

## 2018-11-14 DIAGNOSIS — C499 Malignant neoplasm of connective and soft tissue, unspecified: Secondary | ICD-10-CM | POA: Diagnosis not present

## 2018-11-14 DIAGNOSIS — C7800 Secondary malignant neoplasm of unspecified lung: Secondary | ICD-10-CM | POA: Diagnosis not present

## 2018-11-14 DIAGNOSIS — K8689 Other specified diseases of pancreas: Secondary | ICD-10-CM | POA: Diagnosis not present

## 2018-11-14 DIAGNOSIS — D61818 Other pancytopenia: Secondary | ICD-10-CM | POA: Diagnosis not present

## 2018-11-14 DIAGNOSIS — R590 Localized enlarged lymph nodes: Secondary | ICD-10-CM | POA: Diagnosis not present

## 2018-11-14 DIAGNOSIS — R161 Splenomegaly, not elsewhere classified: Secondary | ICD-10-CM | POA: Diagnosis not present

## 2018-11-14 DIAGNOSIS — Z79899 Other long term (current) drug therapy: Secondary | ICD-10-CM | POA: Diagnosis not present

## 2018-11-14 DIAGNOSIS — Z5111 Encounter for antineoplastic chemotherapy: Secondary | ICD-10-CM | POA: Diagnosis not present

## 2018-11-14 DIAGNOSIS — N261 Atrophy of kidney (terminal): Secondary | ICD-10-CM | POA: Diagnosis not present

## 2018-11-15 DIAGNOSIS — C7800 Secondary malignant neoplasm of unspecified lung: Secondary | ICD-10-CM | POA: Diagnosis not present

## 2018-11-15 DIAGNOSIS — D61818 Other pancytopenia: Secondary | ICD-10-CM | POA: Diagnosis not present

## 2018-11-15 DIAGNOSIS — C499 Malignant neoplasm of connective and soft tissue, unspecified: Secondary | ICD-10-CM | POA: Diagnosis not present

## 2018-11-15 DIAGNOSIS — M86671 Other chronic osteomyelitis, right ankle and foot: Secondary | ICD-10-CM | POA: Diagnosis not present

## 2018-11-15 DIAGNOSIS — R590 Localized enlarged lymph nodes: Secondary | ICD-10-CM | POA: Diagnosis not present

## 2018-11-15 DIAGNOSIS — Z5111 Encounter for antineoplastic chemotherapy: Secondary | ICD-10-CM | POA: Diagnosis not present

## 2018-11-16 DIAGNOSIS — Z5111 Encounter for antineoplastic chemotherapy: Secondary | ICD-10-CM | POA: Diagnosis not present

## 2018-11-16 DIAGNOSIS — C499 Malignant neoplasm of connective and soft tissue, unspecified: Secondary | ICD-10-CM | POA: Diagnosis not present

## 2018-11-16 DIAGNOSIS — C7800 Secondary malignant neoplasm of unspecified lung: Secondary | ICD-10-CM | POA: Diagnosis not present

## 2018-11-16 DIAGNOSIS — D61818 Other pancytopenia: Secondary | ICD-10-CM | POA: Diagnosis not present

## 2018-11-16 DIAGNOSIS — M86671 Other chronic osteomyelitis, right ankle and foot: Secondary | ICD-10-CM | POA: Diagnosis not present

## 2018-11-16 DIAGNOSIS — R590 Localized enlarged lymph nodes: Secondary | ICD-10-CM | POA: Diagnosis not present

## 2018-11-17 ENCOUNTER — Other Ambulatory Visit: Payer: Self-pay

## 2018-11-17 DIAGNOSIS — C7800 Secondary malignant neoplasm of unspecified lung: Secondary | ICD-10-CM | POA: Diagnosis not present

## 2018-11-17 DIAGNOSIS — C499 Malignant neoplasm of connective and soft tissue, unspecified: Secondary | ICD-10-CM | POA: Diagnosis not present

## 2018-11-17 DIAGNOSIS — D61818 Other pancytopenia: Secondary | ICD-10-CM | POA: Diagnosis not present

## 2018-11-17 DIAGNOSIS — M86671 Other chronic osteomyelitis, right ankle and foot: Secondary | ICD-10-CM | POA: Diagnosis not present

## 2018-11-17 DIAGNOSIS — R590 Localized enlarged lymph nodes: Secondary | ICD-10-CM | POA: Diagnosis not present

## 2018-11-17 DIAGNOSIS — Z5111 Encounter for antineoplastic chemotherapy: Secondary | ICD-10-CM | POA: Diagnosis not present

## 2018-11-18 DIAGNOSIS — D61818 Other pancytopenia: Secondary | ICD-10-CM | POA: Diagnosis not present

## 2018-11-18 DIAGNOSIS — M86671 Other chronic osteomyelitis, right ankle and foot: Secondary | ICD-10-CM | POA: Diagnosis not present

## 2018-11-18 DIAGNOSIS — C499 Malignant neoplasm of connective and soft tissue, unspecified: Secondary | ICD-10-CM | POA: Diagnosis not present

## 2018-11-18 DIAGNOSIS — Z5111 Encounter for antineoplastic chemotherapy: Secondary | ICD-10-CM | POA: Diagnosis not present

## 2018-11-18 DIAGNOSIS — C7800 Secondary malignant neoplasm of unspecified lung: Secondary | ICD-10-CM | POA: Diagnosis not present

## 2018-11-18 DIAGNOSIS — R590 Localized enlarged lymph nodes: Secondary | ICD-10-CM | POA: Diagnosis not present

## 2018-11-24 DIAGNOSIS — Z792 Long term (current) use of antibiotics: Secondary | ICD-10-CM | POA: Diagnosis not present

## 2018-11-24 DIAGNOSIS — K529 Noninfective gastroenteritis and colitis, unspecified: Secondary | ICD-10-CM | POA: Diagnosis not present

## 2018-11-24 DIAGNOSIS — M86671 Other chronic osteomyelitis, right ankle and foot: Secondary | ICD-10-CM | POA: Diagnosis present

## 2018-11-24 DIAGNOSIS — L89152 Pressure ulcer of sacral region, stage 2: Secondary | ICD-10-CM | POA: Diagnosis present

## 2018-11-24 DIAGNOSIS — L03116 Cellulitis of left lower limb: Secondary | ICD-10-CM | POA: Diagnosis not present

## 2018-11-24 DIAGNOSIS — K861 Other chronic pancreatitis: Secondary | ICD-10-CM | POA: Diagnosis present

## 2018-11-24 DIAGNOSIS — I110 Hypertensive heart disease with heart failure: Secondary | ICD-10-CM | POA: Diagnosis not present

## 2018-11-24 DIAGNOSIS — L97511 Non-pressure chronic ulcer of other part of right foot limited to breakdown of skin: Secondary | ICD-10-CM | POA: Diagnosis not present

## 2018-11-24 DIAGNOSIS — E785 Hyperlipidemia, unspecified: Secondary | ICD-10-CM | POA: Diagnosis present

## 2018-11-24 DIAGNOSIS — E11618 Type 2 diabetes mellitus with other diabetic arthropathy: Secondary | ICD-10-CM | POA: Diagnosis not present

## 2018-11-24 DIAGNOSIS — G4733 Obstructive sleep apnea (adult) (pediatric): Secondary | ICD-10-CM | POA: Diagnosis present

## 2018-11-24 DIAGNOSIS — Z79899 Other long term (current) drug therapy: Secondary | ICD-10-CM | POA: Diagnosis not present

## 2018-11-24 DIAGNOSIS — N183 Chronic kidney disease, stage 3 (moderate): Secondary | ICD-10-CM | POA: Diagnosis present

## 2018-11-24 DIAGNOSIS — Z452 Encounter for adjustment and management of vascular access device: Secondary | ICD-10-CM | POA: Diagnosis not present

## 2018-11-24 DIAGNOSIS — L03115 Cellulitis of right lower limb: Secondary | ICD-10-CM | POA: Diagnosis not present

## 2018-11-24 DIAGNOSIS — D696 Thrombocytopenia, unspecified: Secondary | ICD-10-CM | POA: Diagnosis not present

## 2018-11-24 DIAGNOSIS — M25571 Pain in right ankle and joints of right foot: Secondary | ICD-10-CM | POA: Diagnosis not present

## 2018-11-24 DIAGNOSIS — Z5181 Encounter for therapeutic drug level monitoring: Secondary | ICD-10-CM | POA: Diagnosis not present

## 2018-11-24 DIAGNOSIS — M00871 Arthritis due to other bacteria, right ankle and foot: Secondary | ICD-10-CM | POA: Diagnosis not present

## 2018-11-24 DIAGNOSIS — I2510 Atherosclerotic heart disease of native coronary artery without angina pectoris: Secondary | ICD-10-CM | POA: Diagnosis present

## 2018-11-24 DIAGNOSIS — M7989 Other specified soft tissue disorders: Secondary | ICD-10-CM | POA: Diagnosis not present

## 2018-11-24 DIAGNOSIS — Z8249 Family history of ischemic heart disease and other diseases of the circulatory system: Secondary | ICD-10-CM | POA: Diagnosis not present

## 2018-11-24 DIAGNOSIS — B965 Pseudomonas (aeruginosa) (mallei) (pseudomallei) as the cause of diseases classified elsewhere: Secondary | ICD-10-CM | POA: Diagnosis present

## 2018-11-24 DIAGNOSIS — I501 Left ventricular failure: Secondary | ICD-10-CM | POA: Diagnosis not present

## 2018-11-24 DIAGNOSIS — E872 Acidosis: Secondary | ICD-10-CM | POA: Diagnosis not present

## 2018-11-24 DIAGNOSIS — R197 Diarrhea, unspecified: Secondary | ICD-10-CM | POA: Diagnosis not present

## 2018-11-24 DIAGNOSIS — I130 Hypertensive heart and chronic kidney disease with heart failure and stage 1 through stage 4 chronic kidney disease, or unspecified chronic kidney disease: Secondary | ICD-10-CM | POA: Diagnosis present

## 2018-11-24 DIAGNOSIS — M868X7 Other osteomyelitis, ankle and foot: Secondary | ICD-10-CM | POA: Diagnosis not present

## 2018-11-24 DIAGNOSIS — Z888 Allergy status to other drugs, medicaments and biological substances status: Secondary | ICD-10-CM | POA: Diagnosis not present

## 2018-11-24 DIAGNOSIS — Z955 Presence of coronary angioplasty implant and graft: Secondary | ICD-10-CM | POA: Diagnosis not present

## 2018-11-24 DIAGNOSIS — I4892 Unspecified atrial flutter: Secondary | ICD-10-CM | POA: Diagnosis not present

## 2018-11-24 DIAGNOSIS — E1122 Type 2 diabetes mellitus with diabetic chronic kidney disease: Secondary | ICD-10-CM | POA: Diagnosis present

## 2018-11-24 DIAGNOSIS — I340 Nonrheumatic mitral (valve) insufficiency: Secondary | ICD-10-CM | POA: Diagnosis present

## 2018-11-24 DIAGNOSIS — M85871 Other specified disorders of bone density and structure, right ankle and foot: Secondary | ICD-10-CM | POA: Diagnosis not present

## 2018-11-24 DIAGNOSIS — M869 Osteomyelitis, unspecified: Secondary | ICD-10-CM | POA: Diagnosis not present

## 2018-11-24 DIAGNOSIS — I4821 Permanent atrial fibrillation: Secondary | ICD-10-CM | POA: Diagnosis present

## 2018-11-24 DIAGNOSIS — Z7901 Long term (current) use of anticoagulants: Secondary | ICD-10-CM | POA: Diagnosis not present

## 2018-11-24 DIAGNOSIS — E1169 Type 2 diabetes mellitus with other specified complication: Secondary | ICD-10-CM | POA: Diagnosis not present

## 2018-11-24 DIAGNOSIS — G4730 Sleep apnea, unspecified: Secondary | ICD-10-CM | POA: Diagnosis not present

## 2018-11-24 DIAGNOSIS — I119 Hypertensive heart disease without heart failure: Secondary | ICD-10-CM | POA: Diagnosis not present

## 2018-11-24 DIAGNOSIS — I482 Chronic atrial fibrillation, unspecified: Secondary | ICD-10-CM | POA: Diagnosis not present

## 2018-11-24 DIAGNOSIS — M86171 Other acute osteomyelitis, right ankle and foot: Secondary | ICD-10-CM | POA: Diagnosis not present

## 2018-11-24 DIAGNOSIS — I959 Hypotension, unspecified: Secondary | ICD-10-CM | POA: Diagnosis not present

## 2018-11-24 DIAGNOSIS — S90931A Unspecified superficial injury of right great toe, initial encounter: Secondary | ICD-10-CM | POA: Diagnosis not present

## 2018-11-24 DIAGNOSIS — D693 Immune thrombocytopenic purpura: Secondary | ICD-10-CM | POA: Diagnosis not present

## 2018-11-24 DIAGNOSIS — M86371 Chronic multifocal osteomyelitis, right ankle and foot: Secondary | ICD-10-CM | POA: Diagnosis not present

## 2018-11-24 DIAGNOSIS — K8681 Exocrine pancreatic insufficiency: Secondary | ICD-10-CM | POA: Diagnosis present

## 2018-11-24 DIAGNOSIS — Z89421 Acquired absence of other right toe(s): Secondary | ICD-10-CM | POA: Diagnosis not present

## 2018-11-24 DIAGNOSIS — I5020 Unspecified systolic (congestive) heart failure: Secondary | ICD-10-CM | POA: Diagnosis not present

## 2018-11-24 DIAGNOSIS — I10 Essential (primary) hypertension: Secondary | ICD-10-CM | POA: Diagnosis not present

## 2018-11-24 DIAGNOSIS — C490 Malignant neoplasm of connective and soft tissue of head, face and neck: Secondary | ICD-10-CM | POA: Diagnosis not present

## 2018-11-24 DIAGNOSIS — I4891 Unspecified atrial fibrillation: Secondary | ICD-10-CM | POA: Diagnosis not present

## 2018-11-24 DIAGNOSIS — M009 Pyogenic arthritis, unspecified: Secondary | ICD-10-CM | POA: Diagnosis present

## 2018-11-24 DIAGNOSIS — I509 Heart failure, unspecified: Secondary | ICD-10-CM | POA: Diagnosis present

## 2018-11-24 DIAGNOSIS — D899 Disorder involving the immune mechanism, unspecified: Secondary | ICD-10-CM | POA: Diagnosis not present

## 2018-11-24 DIAGNOSIS — Z940 Kidney transplant status: Secondary | ICD-10-CM | POA: Diagnosis not present

## 2018-11-24 DIAGNOSIS — I4430 Unspecified atrioventricular block: Secondary | ICD-10-CM | POA: Diagnosis not present

## 2018-11-30 DIAGNOSIS — M00871 Arthritis due to other bacteria, right ankle and foot: Secondary | ICD-10-CM | POA: Diagnosis not present

## 2018-11-30 DIAGNOSIS — L03116 Cellulitis of left lower limb: Secondary | ICD-10-CM | POA: Diagnosis not present

## 2018-11-30 DIAGNOSIS — E1169 Type 2 diabetes mellitus with other specified complication: Secondary | ICD-10-CM | POA: Diagnosis not present

## 2018-11-30 DIAGNOSIS — B965 Pseudomonas (aeruginosa) (mallei) (pseudomallei) as the cause of diseases classified elsewhere: Secondary | ICD-10-CM | POA: Diagnosis not present

## 2018-11-30 DIAGNOSIS — Z5181 Encounter for therapeutic drug level monitoring: Secondary | ICD-10-CM | POA: Diagnosis not present

## 2018-11-30 DIAGNOSIS — M86171 Other acute osteomyelitis, right ankle and foot: Secondary | ICD-10-CM | POA: Diagnosis not present

## 2018-12-01 DIAGNOSIS — L97309 Non-pressure chronic ulcer of unspecified ankle with unspecified severity: Secondary | ICD-10-CM | POA: Diagnosis not present

## 2018-12-01 DIAGNOSIS — E11622 Type 2 diabetes mellitus with other skin ulcer: Secondary | ICD-10-CM | POA: Diagnosis not present

## 2018-12-01 DIAGNOSIS — M86671 Other chronic osteomyelitis, right ankle and foot: Secondary | ICD-10-CM | POA: Diagnosis not present

## 2018-12-01 DIAGNOSIS — I130 Hypertensive heart and chronic kidney disease with heart failure and stage 1 through stage 4 chronic kidney disease, or unspecified chronic kidney disease: Secondary | ICD-10-CM | POA: Diagnosis not present

## 2018-12-01 DIAGNOSIS — M00871 Arthritis due to other bacteria, right ankle and foot: Secondary | ICD-10-CM | POA: Diagnosis not present

## 2018-12-01 DIAGNOSIS — I4819 Other persistent atrial fibrillation: Secondary | ICD-10-CM | POA: Diagnosis not present

## 2018-12-01 DIAGNOSIS — I739 Peripheral vascular disease, unspecified: Secondary | ICD-10-CM | POA: Diagnosis not present

## 2018-12-01 DIAGNOSIS — L89151 Pressure ulcer of sacral region, stage 1: Secondary | ICD-10-CM | POA: Diagnosis not present

## 2018-12-01 DIAGNOSIS — D61818 Other pancytopenia: Secondary | ICD-10-CM | POA: Diagnosis not present

## 2018-12-01 DIAGNOSIS — I5022 Chronic systolic (congestive) heart failure: Secondary | ICD-10-CM | POA: Diagnosis not present

## 2018-12-01 DIAGNOSIS — E1151 Type 2 diabetes mellitus with diabetic peripheral angiopathy without gangrene: Secondary | ICD-10-CM | POA: Diagnosis not present

## 2018-12-01 DIAGNOSIS — C490 Malignant neoplasm of connective and soft tissue of head, face and neck: Secondary | ICD-10-CM | POA: Diagnosis not present

## 2018-12-05 DIAGNOSIS — I5022 Chronic systolic (congestive) heart failure: Secondary | ICD-10-CM | POA: Diagnosis not present

## 2018-12-05 DIAGNOSIS — M00871 Arthritis due to other bacteria, right ankle and foot: Secondary | ICD-10-CM | POA: Diagnosis not present

## 2018-12-05 DIAGNOSIS — I739 Peripheral vascular disease, unspecified: Secondary | ICD-10-CM | POA: Diagnosis not present

## 2018-12-05 DIAGNOSIS — B965 Pseudomonas (aeruginosa) (mallei) (pseudomallei) as the cause of diseases classified elsewhere: Secondary | ICD-10-CM | POA: Diagnosis not present

## 2018-12-05 DIAGNOSIS — I2510 Atherosclerotic heart disease of native coronary artery without angina pectoris: Secondary | ICD-10-CM | POA: Diagnosis not present

## 2018-12-05 DIAGNOSIS — E1169 Type 2 diabetes mellitus with other specified complication: Secondary | ICD-10-CM | POA: Diagnosis not present

## 2018-12-05 DIAGNOSIS — L03116 Cellulitis of left lower limb: Secondary | ICD-10-CM | POA: Diagnosis not present

## 2018-12-05 DIAGNOSIS — M86171 Other acute osteomyelitis, right ankle and foot: Secondary | ICD-10-CM | POA: Diagnosis not present

## 2018-12-05 DIAGNOSIS — Z5181 Encounter for therapeutic drug level monitoring: Secondary | ICD-10-CM | POA: Diagnosis not present

## 2018-12-09 DIAGNOSIS — C499 Malignant neoplasm of connective and soft tissue, unspecified: Secondary | ICD-10-CM | POA: Diagnosis not present

## 2018-12-09 DIAGNOSIS — C490 Malignant neoplasm of connective and soft tissue of head, face and neck: Secondary | ICD-10-CM | POA: Diagnosis not present

## 2018-12-09 DIAGNOSIS — C7800 Secondary malignant neoplasm of unspecified lung: Secondary | ICD-10-CM | POA: Diagnosis not present

## 2018-12-09 DIAGNOSIS — R110 Nausea: Secondary | ICD-10-CM | POA: Diagnosis not present

## 2018-12-09 DIAGNOSIS — N183 Chronic kidney disease, stage 3 (moderate): Secondary | ICD-10-CM | POA: Diagnosis not present

## 2018-12-09 DIAGNOSIS — K8689 Other specified diseases of pancreas: Secondary | ICD-10-CM | POA: Diagnosis not present

## 2018-12-09 DIAGNOSIS — M86271 Subacute osteomyelitis, right ankle and foot: Secondary | ICD-10-CM | POA: Diagnosis not present

## 2018-12-12 DIAGNOSIS — M00871 Arthritis due to other bacteria, right ankle and foot: Secondary | ICD-10-CM | POA: Diagnosis not present

## 2018-12-12 DIAGNOSIS — L03116 Cellulitis of left lower limb: Secondary | ICD-10-CM | POA: Diagnosis not present

## 2018-12-12 DIAGNOSIS — M86171 Other acute osteomyelitis, right ankle and foot: Secondary | ICD-10-CM | POA: Diagnosis not present

## 2018-12-12 DIAGNOSIS — Z5181 Encounter for therapeutic drug level monitoring: Secondary | ICD-10-CM | POA: Diagnosis not present

## 2018-12-12 DIAGNOSIS — E1169 Type 2 diabetes mellitus with other specified complication: Secondary | ICD-10-CM | POA: Diagnosis not present

## 2018-12-12 DIAGNOSIS — B965 Pseudomonas (aeruginosa) (mallei) (pseudomallei) as the cause of diseases classified elsewhere: Secondary | ICD-10-CM | POA: Diagnosis not present

## 2018-12-19 DIAGNOSIS — B965 Pseudomonas (aeruginosa) (mallei) (pseudomallei) as the cause of diseases classified elsewhere: Secondary | ICD-10-CM | POA: Diagnosis not present

## 2018-12-19 DIAGNOSIS — L03116 Cellulitis of left lower limb: Secondary | ICD-10-CM | POA: Diagnosis not present

## 2018-12-19 DIAGNOSIS — Z5181 Encounter for therapeutic drug level monitoring: Secondary | ICD-10-CM | POA: Diagnosis not present

## 2018-12-19 DIAGNOSIS — E1169 Type 2 diabetes mellitus with other specified complication: Secondary | ICD-10-CM | POA: Diagnosis not present

## 2018-12-19 DIAGNOSIS — M00871 Arthritis due to other bacteria, right ankle and foot: Secondary | ICD-10-CM | POA: Diagnosis not present

## 2018-12-19 DIAGNOSIS — M86171 Other acute osteomyelitis, right ankle and foot: Secondary | ICD-10-CM | POA: Diagnosis not present

## 2018-12-20 DIAGNOSIS — Z5181 Encounter for therapeutic drug level monitoring: Secondary | ICD-10-CM | POA: Diagnosis not present

## 2018-12-20 DIAGNOSIS — M00871 Arthritis due to other bacteria, right ankle and foot: Secondary | ICD-10-CM | POA: Diagnosis not present

## 2018-12-20 DIAGNOSIS — M86171 Other acute osteomyelitis, right ankle and foot: Secondary | ICD-10-CM | POA: Diagnosis not present

## 2018-12-20 DIAGNOSIS — B965 Pseudomonas (aeruginosa) (mallei) (pseudomallei) as the cause of diseases classified elsewhere: Secondary | ICD-10-CM | POA: Diagnosis not present

## 2018-12-20 DIAGNOSIS — L03116 Cellulitis of left lower limb: Secondary | ICD-10-CM | POA: Diagnosis not present

## 2018-12-20 DIAGNOSIS — E1169 Type 2 diabetes mellitus with other specified complication: Secondary | ICD-10-CM | POA: Diagnosis not present

## 2018-12-21 DIAGNOSIS — M86171 Other acute osteomyelitis, right ankle and foot: Secondary | ICD-10-CM | POA: Diagnosis not present

## 2018-12-21 DIAGNOSIS — N183 Chronic kidney disease, stage 3 (moderate): Secondary | ICD-10-CM | POA: Diagnosis not present

## 2018-12-21 DIAGNOSIS — A498 Other bacterial infections of unspecified site: Secondary | ICD-10-CM | POA: Diagnosis not present

## 2018-12-21 DIAGNOSIS — Z792 Long term (current) use of antibiotics: Secondary | ICD-10-CM | POA: Diagnosis not present

## 2018-12-26 DIAGNOSIS — Z940 Kidney transplant status: Secondary | ICD-10-CM | POA: Diagnosis not present

## 2018-12-26 DIAGNOSIS — M86171 Other acute osteomyelitis, right ankle and foot: Secondary | ICD-10-CM | POA: Diagnosis not present

## 2018-12-26 DIAGNOSIS — I4892 Unspecified atrial flutter: Secondary | ICD-10-CM | POA: Diagnosis not present

## 2018-12-26 DIAGNOSIS — L03116 Cellulitis of left lower limb: Secondary | ICD-10-CM | POA: Diagnosis not present

## 2018-12-26 DIAGNOSIS — R9431 Abnormal electrocardiogram [ECG] [EKG]: Secondary | ICD-10-CM | POA: Diagnosis not present

## 2018-12-26 DIAGNOSIS — M00871 Arthritis due to other bacteria, right ankle and foot: Secondary | ICD-10-CM | POA: Diagnosis not present

## 2018-12-26 DIAGNOSIS — S066X9A Traumatic subarachnoid hemorrhage with loss of consciousness of unspecified duration, initial encounter: Secondary | ICD-10-CM | POA: Diagnosis not present

## 2018-12-26 DIAGNOSIS — Z5181 Encounter for therapeutic drug level monitoring: Secondary | ICD-10-CM | POA: Diagnosis not present

## 2018-12-26 DIAGNOSIS — B965 Pseudomonas (aeruginosa) (mallei) (pseudomallei) as the cause of diseases classified elsewhere: Secondary | ICD-10-CM | POA: Diagnosis not present

## 2018-12-26 DIAGNOSIS — I4439 Other atrioventricular block: Secondary | ICD-10-CM | POA: Diagnosis not present

## 2018-12-26 DIAGNOSIS — Z681 Body mass index (BMI) 19 or less, adult: Secondary | ICD-10-CM | POA: Diagnosis not present

## 2018-12-26 DIAGNOSIS — R64 Cachexia: Secondary | ICD-10-CM | POA: Diagnosis not present

## 2018-12-26 DIAGNOSIS — M868X7 Other osteomyelitis, ankle and foot: Secondary | ICD-10-CM | POA: Diagnosis not present

## 2018-12-26 DIAGNOSIS — I5022 Chronic systolic (congestive) heart failure: Secondary | ICD-10-CM | POA: Diagnosis not present

## 2018-12-26 DIAGNOSIS — S066X0A Traumatic subarachnoid hemorrhage without loss of consciousness, initial encounter: Secondary | ICD-10-CM | POA: Diagnosis not present

## 2018-12-26 DIAGNOSIS — E1169 Type 2 diabetes mellitus with other specified complication: Secondary | ICD-10-CM | POA: Diagnosis not present

## 2018-12-27 DIAGNOSIS — M86171 Other acute osteomyelitis, right ankle and foot: Secondary | ICD-10-CM | POA: Diagnosis not present

## 2018-12-27 DIAGNOSIS — M00871 Arthritis due to other bacteria, right ankle and foot: Secondary | ICD-10-CM | POA: Diagnosis not present

## 2018-12-27 DIAGNOSIS — E1169 Type 2 diabetes mellitus with other specified complication: Secondary | ICD-10-CM | POA: Diagnosis not present

## 2018-12-27 DIAGNOSIS — B965 Pseudomonas (aeruginosa) (mallei) (pseudomallei) as the cause of diseases classified elsewhere: Secondary | ICD-10-CM | POA: Diagnosis not present

## 2018-12-27 DIAGNOSIS — L03116 Cellulitis of left lower limb: Secondary | ICD-10-CM | POA: Diagnosis not present

## 2018-12-27 DIAGNOSIS — Z5181 Encounter for therapeutic drug level monitoring: Secondary | ICD-10-CM | POA: Diagnosis not present

## 2018-12-28 DIAGNOSIS — I739 Peripheral vascular disease, unspecified: Secondary | ICD-10-CM | POA: Diagnosis not present

## 2018-12-28 DIAGNOSIS — Z9889 Other specified postprocedural states: Secondary | ICD-10-CM | POA: Diagnosis not present

## 2018-12-28 DIAGNOSIS — M2141 Flat foot [pes planus] (acquired), right foot: Secondary | ICD-10-CM | POA: Diagnosis not present

## 2018-12-28 DIAGNOSIS — M86671 Other chronic osteomyelitis, right ankle and foot: Secondary | ICD-10-CM | POA: Diagnosis not present

## 2018-12-29 DIAGNOSIS — Z833 Family history of diabetes mellitus: Secondary | ICD-10-CM | POA: Diagnosis not present

## 2018-12-29 DIAGNOSIS — M47812 Spondylosis without myelopathy or radiculopathy, cervical region: Secondary | ICD-10-CM | POA: Diagnosis not present

## 2018-12-29 DIAGNOSIS — C490 Malignant neoplasm of connective and soft tissue of head, face and neck: Secondary | ICD-10-CM | POA: Diagnosis not present

## 2018-12-29 DIAGNOSIS — I5020 Unspecified systolic (congestive) heart failure: Secondary | ICD-10-CM | POA: Diagnosis not present

## 2018-12-29 DIAGNOSIS — G4730 Sleep apnea, unspecified: Secondary | ICD-10-CM | POA: Diagnosis not present

## 2018-12-29 DIAGNOSIS — R162 Hepatomegaly with splenomegaly, not elsewhere classified: Secondary | ICD-10-CM | POA: Diagnosis not present

## 2018-12-29 DIAGNOSIS — N183 Chronic kidney disease, stage 3 (moderate): Secondary | ICD-10-CM | POA: Diagnosis not present

## 2018-12-29 DIAGNOSIS — S066X9A Traumatic subarachnoid hemorrhage with loss of consciousness of unspecified duration, initial encounter: Secondary | ICD-10-CM | POA: Diagnosis not present

## 2018-12-29 DIAGNOSIS — I110 Hypertensive heart disease with heart failure: Secondary | ICD-10-CM | POA: Diagnosis present

## 2018-12-29 DIAGNOSIS — E785 Hyperlipidemia, unspecified: Secondary | ICD-10-CM | POA: Diagnosis present

## 2018-12-29 DIAGNOSIS — Z7984 Long term (current) use of oral hypoglycemic drugs: Secondary | ICD-10-CM | POA: Diagnosis not present

## 2018-12-29 DIAGNOSIS — S066X0A Traumatic subarachnoid hemorrhage without loss of consciousness, initial encounter: Secondary | ICD-10-CM | POA: Diagnosis present

## 2018-12-29 DIAGNOSIS — E1169 Type 2 diabetes mellitus with other specified complication: Secondary | ICD-10-CM | POA: Diagnosis present

## 2018-12-29 DIAGNOSIS — C4440 Unspecified malignant neoplasm of skin of scalp and neck: Secondary | ICD-10-CM | POA: Diagnosis present

## 2018-12-29 DIAGNOSIS — Z7901 Long term (current) use of anticoagulants: Secondary | ICD-10-CM | POA: Diagnosis not present

## 2018-12-29 DIAGNOSIS — Z8249 Family history of ischemic heart disease and other diseases of the circulatory system: Secondary | ICD-10-CM | POA: Diagnosis not present

## 2018-12-29 DIAGNOSIS — I2510 Atherosclerotic heart disease of native coronary artery without angina pectoris: Secondary | ICD-10-CM | POA: Diagnosis present

## 2018-12-29 DIAGNOSIS — I130 Hypertensive heart and chronic kidney disease with heart failure and stage 1 through stage 4 chronic kidney disease, or unspecified chronic kidney disease: Secondary | ICD-10-CM | POA: Diagnosis not present

## 2018-12-29 DIAGNOSIS — R64 Cachexia: Secondary | ICD-10-CM | POA: Diagnosis present

## 2018-12-29 DIAGNOSIS — Z940 Kidney transplant status: Secondary | ICD-10-CM | POA: Diagnosis not present

## 2018-12-29 DIAGNOSIS — Z23 Encounter for immunization: Secondary | ICD-10-CM | POA: Diagnosis not present

## 2018-12-29 DIAGNOSIS — Z79899 Other long term (current) drug therapy: Secondary | ICD-10-CM | POA: Diagnosis not present

## 2018-12-29 DIAGNOSIS — S199XXA Unspecified injury of neck, initial encounter: Secondary | ICD-10-CM | POA: Diagnosis not present

## 2018-12-29 DIAGNOSIS — M86171 Other acute osteomyelitis, right ankle and foot: Secondary | ICD-10-CM | POA: Diagnosis not present

## 2018-12-29 DIAGNOSIS — Z9181 History of falling: Secondary | ICD-10-CM | POA: Diagnosis not present

## 2018-12-29 DIAGNOSIS — Z888 Allergy status to other drugs, medicaments and biological substances status: Secondary | ICD-10-CM | POA: Diagnosis not present

## 2018-12-29 DIAGNOSIS — Y998 Other external cause status: Secondary | ICD-10-CM | POA: Diagnosis not present

## 2018-12-29 DIAGNOSIS — Z452 Encounter for adjustment and management of vascular access device: Secondary | ICD-10-CM | POA: Diagnosis not present

## 2018-12-29 DIAGNOSIS — S299XXA Unspecified injury of thorax, initial encounter: Secondary | ICD-10-CM | POA: Diagnosis not present

## 2018-12-29 DIAGNOSIS — I4439 Other atrioventricular block: Secondary | ICD-10-CM | POA: Diagnosis not present

## 2018-12-29 DIAGNOSIS — Z792 Long term (current) use of antibiotics: Secondary | ICD-10-CM | POA: Diagnosis not present

## 2018-12-29 DIAGNOSIS — S062X0A Diffuse traumatic brain injury without loss of consciousness, initial encounter: Secondary | ICD-10-CM | POA: Diagnosis not present

## 2018-12-29 DIAGNOSIS — S0990XA Unspecified injury of head, initial encounter: Secondary | ICD-10-CM | POA: Diagnosis not present

## 2018-12-29 DIAGNOSIS — S0101XA Laceration without foreign body of scalp, initial encounter: Secondary | ICD-10-CM | POA: Diagnosis not present

## 2018-12-29 DIAGNOSIS — S0191XA Laceration without foreign body of unspecified part of head, initial encounter: Secondary | ICD-10-CM | POA: Diagnosis not present

## 2018-12-29 DIAGNOSIS — B965 Pseudomonas (aeruginosa) (mallei) (pseudomallei) as the cause of diseases classified elsewhere: Secondary | ICD-10-CM | POA: Diagnosis not present

## 2018-12-29 DIAGNOSIS — E871 Hypo-osmolality and hyponatremia: Secondary | ICD-10-CM | POA: Diagnosis not present

## 2018-12-29 DIAGNOSIS — E1151 Type 2 diabetes mellitus with diabetic peripheral angiopathy without gangrene: Secondary | ICD-10-CM | POA: Diagnosis present

## 2018-12-29 DIAGNOSIS — I4891 Unspecified atrial fibrillation: Secondary | ICD-10-CM | POA: Diagnosis present

## 2018-12-29 DIAGNOSIS — Z681 Body mass index (BMI) 19 or less, adult: Secondary | ICD-10-CM | POA: Diagnosis not present

## 2018-12-29 DIAGNOSIS — Z955 Presence of coronary angioplasty implant and graft: Secondary | ICD-10-CM | POA: Diagnosis not present

## 2018-12-29 DIAGNOSIS — Z5181 Encounter for therapeutic drug level monitoring: Secondary | ICD-10-CM | POA: Diagnosis not present

## 2018-12-29 DIAGNOSIS — J90 Pleural effusion, not elsewhere classified: Secondary | ICD-10-CM | POA: Diagnosis not present

## 2018-12-29 DIAGNOSIS — K529 Noninfective gastroenteritis and colitis, unspecified: Secondary | ICD-10-CM | POA: Diagnosis not present

## 2018-12-29 DIAGNOSIS — I10 Essential (primary) hypertension: Secondary | ICD-10-CM | POA: Diagnosis not present

## 2018-12-29 DIAGNOSIS — S098XXA Other specified injuries of head, initial encounter: Secondary | ICD-10-CM | POA: Diagnosis not present

## 2018-12-29 DIAGNOSIS — M868X7 Other osteomyelitis, ankle and foot: Secondary | ICD-10-CM | POA: Diagnosis present

## 2018-12-29 DIAGNOSIS — Z043 Encounter for examination and observation following other accident: Secondary | ICD-10-CM | POA: Diagnosis not present

## 2018-12-29 DIAGNOSIS — W010XXA Fall on same level from slipping, tripping and stumbling without subsequent striking against object, initial encounter: Secondary | ICD-10-CM | POA: Diagnosis not present

## 2018-12-29 DIAGNOSIS — Z9221 Personal history of antineoplastic chemotherapy: Secondary | ICD-10-CM | POA: Diagnosis not present

## 2018-12-29 DIAGNOSIS — N400 Enlarged prostate without lower urinary tract symptoms: Secondary | ICD-10-CM | POA: Diagnosis present

## 2018-12-29 DIAGNOSIS — D693 Immune thrombocytopenic purpura: Secondary | ICD-10-CM | POA: Diagnosis not present

## 2018-12-29 DIAGNOSIS — H9190 Unspecified hearing loss, unspecified ear: Secondary | ICD-10-CM | POA: Diagnosis not present

## 2018-12-29 DIAGNOSIS — Z89421 Acquired absence of other right toe(s): Secondary | ICD-10-CM | POA: Diagnosis not present

## 2018-12-29 DIAGNOSIS — M00871 Arthritis due to other bacteria, right ankle and foot: Secondary | ICD-10-CM | POA: Diagnosis not present

## 2018-12-29 DIAGNOSIS — R188 Other ascites: Secondary | ICD-10-CM | POA: Diagnosis not present

## 2018-12-29 DIAGNOSIS — E1122 Type 2 diabetes mellitus with diabetic chronic kidney disease: Secondary | ICD-10-CM | POA: Diagnosis not present

## 2018-12-29 DIAGNOSIS — I4892 Unspecified atrial flutter: Secondary | ICD-10-CM | POA: Diagnosis not present

## 2018-12-29 DIAGNOSIS — I5022 Chronic systolic (congestive) heart failure: Secondary | ICD-10-CM | POA: Diagnosis present

## 2018-12-29 DIAGNOSIS — L03116 Cellulitis of left lower limb: Secondary | ICD-10-CM | POA: Diagnosis not present

## 2018-12-29 DIAGNOSIS — R9431 Abnormal electrocardiogram [ECG] [EKG]: Secondary | ICD-10-CM | POA: Diagnosis not present

## 2018-12-29 DIAGNOSIS — Z7952 Long term (current) use of systemic steroids: Secondary | ICD-10-CM | POA: Diagnosis not present

## 2018-12-29 DIAGNOSIS — Z974 Presence of external hearing-aid: Secondary | ICD-10-CM | POA: Diagnosis not present

## 2018-12-30 DIAGNOSIS — Z792 Long term (current) use of antibiotics: Secondary | ICD-10-CM | POA: Diagnosis not present

## 2018-12-30 DIAGNOSIS — C490 Malignant neoplasm of connective and soft tissue of head, face and neck: Secondary | ICD-10-CM | POA: Diagnosis not present

## 2018-12-30 DIAGNOSIS — N183 Chronic kidney disease, stage 3 (moderate): Secondary | ICD-10-CM | POA: Diagnosis not present

## 2018-12-30 DIAGNOSIS — M00871 Arthritis due to other bacteria, right ankle and foot: Secondary | ICD-10-CM | POA: Diagnosis not present

## 2018-12-30 DIAGNOSIS — K529 Noninfective gastroenteritis and colitis, unspecified: Secondary | ICD-10-CM | POA: Diagnosis not present

## 2018-12-30 DIAGNOSIS — S098XXA Other specified injuries of head, initial encounter: Secondary | ICD-10-CM | POA: Diagnosis not present

## 2018-12-30 DIAGNOSIS — Z7901 Long term (current) use of anticoagulants: Secondary | ICD-10-CM | POA: Diagnosis not present

## 2018-12-30 DIAGNOSIS — S066X0A Traumatic subarachnoid hemorrhage without loss of consciousness, initial encounter: Secondary | ICD-10-CM | POA: Diagnosis not present

## 2018-12-30 DIAGNOSIS — Z940 Kidney transplant status: Secondary | ICD-10-CM | POA: Diagnosis not present

## 2018-12-30 DIAGNOSIS — E1122 Type 2 diabetes mellitus with diabetic chronic kidney disease: Secondary | ICD-10-CM | POA: Diagnosis not present

## 2018-12-30 DIAGNOSIS — B965 Pseudomonas (aeruginosa) (mallei) (pseudomallei) as the cause of diseases classified elsewhere: Secondary | ICD-10-CM | POA: Diagnosis not present

## 2018-12-30 DIAGNOSIS — Z89421 Acquired absence of other right toe(s): Secondary | ICD-10-CM | POA: Diagnosis not present

## 2018-12-30 DIAGNOSIS — Z5181 Encounter for therapeutic drug level monitoring: Secondary | ICD-10-CM | POA: Diagnosis not present

## 2018-12-30 DIAGNOSIS — Z452 Encounter for adjustment and management of vascular access device: Secondary | ICD-10-CM | POA: Diagnosis not present

## 2018-12-30 DIAGNOSIS — G4730 Sleep apnea, unspecified: Secondary | ICD-10-CM | POA: Diagnosis not present

## 2018-12-30 DIAGNOSIS — I130 Hypertensive heart and chronic kidney disease with heart failure and stage 1 through stage 4 chronic kidney disease, or unspecified chronic kidney disease: Secondary | ICD-10-CM | POA: Diagnosis not present

## 2018-12-30 DIAGNOSIS — L03116 Cellulitis of left lower limb: Secondary | ICD-10-CM | POA: Diagnosis not present

## 2018-12-30 DIAGNOSIS — I4891 Unspecified atrial fibrillation: Secondary | ICD-10-CM | POA: Diagnosis not present

## 2018-12-30 DIAGNOSIS — M86171 Other acute osteomyelitis, right ankle and foot: Secondary | ICD-10-CM | POA: Diagnosis not present

## 2018-12-30 DIAGNOSIS — I5020 Unspecified systolic (congestive) heart failure: Secondary | ICD-10-CM | POA: Diagnosis not present

## 2018-12-30 DIAGNOSIS — E1169 Type 2 diabetes mellitus with other specified complication: Secondary | ICD-10-CM | POA: Diagnosis not present

## 2018-12-30 DIAGNOSIS — D693 Immune thrombocytopenic purpura: Secondary | ICD-10-CM | POA: Diagnosis not present

## 2019-01-03 DIAGNOSIS — L03116 Cellulitis of left lower limb: Secondary | ICD-10-CM | POA: Diagnosis not present

## 2019-01-03 DIAGNOSIS — Z792 Long term (current) use of antibiotics: Secondary | ICD-10-CM | POA: Diagnosis not present

## 2019-01-03 DIAGNOSIS — B965 Pseudomonas (aeruginosa) (mallei) (pseudomallei) as the cause of diseases classified elsewhere: Secondary | ICD-10-CM | POA: Diagnosis not present

## 2019-01-03 DIAGNOSIS — E1169 Type 2 diabetes mellitus with other specified complication: Secondary | ICD-10-CM | POA: Diagnosis not present

## 2019-01-03 DIAGNOSIS — Z5181 Encounter for therapeutic drug level monitoring: Secondary | ICD-10-CM | POA: Diagnosis not present

## 2019-01-03 DIAGNOSIS — M00871 Arthritis due to other bacteria, right ankle and foot: Secondary | ICD-10-CM | POA: Diagnosis not present

## 2019-01-03 DIAGNOSIS — M86171 Other acute osteomyelitis, right ankle and foot: Secondary | ICD-10-CM | POA: Diagnosis not present

## 2019-01-04 DIAGNOSIS — I82890 Acute embolism and thrombosis of other specified veins: Secondary | ICD-10-CM | POA: Diagnosis not present

## 2019-01-04 DIAGNOSIS — L03116 Cellulitis of left lower limb: Secondary | ICD-10-CM | POA: Diagnosis not present

## 2019-01-04 DIAGNOSIS — A498 Other bacterial infections of unspecified site: Secondary | ICD-10-CM | POA: Diagnosis not present

## 2019-01-04 DIAGNOSIS — R748 Abnormal levels of other serum enzymes: Secondary | ICD-10-CM | POA: Diagnosis not present

## 2019-01-04 DIAGNOSIS — M00871 Arthritis due to other bacteria, right ankle and foot: Secondary | ICD-10-CM | POA: Diagnosis not present

## 2019-01-04 DIAGNOSIS — E1169 Type 2 diabetes mellitus with other specified complication: Secondary | ICD-10-CM | POA: Diagnosis not present

## 2019-01-04 DIAGNOSIS — Z5181 Encounter for therapeutic drug level monitoring: Secondary | ICD-10-CM | POA: Diagnosis not present

## 2019-01-04 DIAGNOSIS — M86171 Other acute osteomyelitis, right ankle and foot: Secondary | ICD-10-CM | POA: Diagnosis not present

## 2019-01-04 DIAGNOSIS — B965 Pseudomonas (aeruginosa) (mallei) (pseudomallei) as the cause of diseases classified elsewhere: Secondary | ICD-10-CM | POA: Diagnosis not present

## 2019-01-04 DIAGNOSIS — I864 Gastric varices: Secondary | ICD-10-CM | POA: Diagnosis not present

## 2019-01-04 DIAGNOSIS — K8689 Other specified diseases of pancreas: Secondary | ICD-10-CM | POA: Diagnosis not present

## 2019-01-04 DIAGNOSIS — Z792 Long term (current) use of antibiotics: Secondary | ICD-10-CM | POA: Diagnosis not present

## 2019-01-04 DIAGNOSIS — Z940 Kidney transplant status: Secondary | ICD-10-CM | POA: Diagnosis not present

## 2019-01-04 DIAGNOSIS — K529 Noninfective gastroenteritis and colitis, unspecified: Secondary | ICD-10-CM | POA: Diagnosis not present

## 2019-01-04 DIAGNOSIS — N183 Chronic kidney disease, stage 3 (moderate): Secondary | ICD-10-CM | POA: Diagnosis not present

## 2019-01-04 DIAGNOSIS — M86671 Other chronic osteomyelitis, right ankle and foot: Secondary | ICD-10-CM | POA: Diagnosis not present

## 2019-01-05 DIAGNOSIS — S51812A Laceration without foreign body of left forearm, initial encounter: Secondary | ICD-10-CM | POA: Diagnosis not present

## 2019-01-05 DIAGNOSIS — M86671 Other chronic osteomyelitis, right ankle and foot: Secondary | ICD-10-CM | POA: Diagnosis not present

## 2019-01-05 DIAGNOSIS — L97511 Non-pressure chronic ulcer of other part of right foot limited to breakdown of skin: Secondary | ICD-10-CM | POA: Diagnosis not present

## 2019-01-05 DIAGNOSIS — S51811A Laceration without foreign body of right forearm, initial encounter: Secondary | ICD-10-CM | POA: Diagnosis not present

## 2019-01-05 DIAGNOSIS — B9689 Other specified bacterial agents as the cause of diseases classified elsewhere: Secondary | ICD-10-CM | POA: Diagnosis not present

## 2019-01-05 DIAGNOSIS — S0101XA Laceration without foreign body of scalp, initial encounter: Secondary | ICD-10-CM | POA: Diagnosis not present

## 2019-01-05 DIAGNOSIS — Z8631 Personal history of diabetic foot ulcer: Secondary | ICD-10-CM | POA: Diagnosis not present

## 2019-01-09 DIAGNOSIS — Z23 Encounter for immunization: Secondary | ICD-10-CM | POA: Diagnosis not present

## 2019-01-09 DIAGNOSIS — S41102A Unspecified open wound of left upper arm, initial encounter: Secondary | ICD-10-CM | POA: Diagnosis not present

## 2019-01-09 DIAGNOSIS — B965 Pseudomonas (aeruginosa) (mallei) (pseudomallei) as the cause of diseases classified elsewhere: Secondary | ICD-10-CM | POA: Diagnosis not present

## 2019-01-09 DIAGNOSIS — C490 Malignant neoplasm of connective and soft tissue of head, face and neck: Secondary | ICD-10-CM | POA: Diagnosis not present

## 2019-01-09 DIAGNOSIS — Z5181 Encounter for therapeutic drug level monitoring: Secondary | ICD-10-CM | POA: Diagnosis not present

## 2019-01-09 DIAGNOSIS — E43 Unspecified severe protein-calorie malnutrition: Secondary | ICD-10-CM | POA: Diagnosis not present

## 2019-01-09 DIAGNOSIS — N183 Chronic kidney disease, stage 3 (moderate): Secondary | ICD-10-CM | POA: Diagnosis not present

## 2019-01-09 DIAGNOSIS — E1151 Type 2 diabetes mellitus with diabetic peripheral angiopathy without gangrene: Secondary | ICD-10-CM | POA: Diagnosis not present

## 2019-01-09 DIAGNOSIS — R7989 Other specified abnormal findings of blood chemistry: Secondary | ICD-10-CM | POA: Diagnosis not present

## 2019-01-09 DIAGNOSIS — M00871 Arthritis due to other bacteria, right ankle and foot: Secondary | ICD-10-CM | POA: Diagnosis not present

## 2019-01-09 DIAGNOSIS — M86171 Other acute osteomyelitis, right ankle and foot: Secondary | ICD-10-CM | POA: Diagnosis not present

## 2019-01-09 DIAGNOSIS — L03116 Cellulitis of left lower limb: Secondary | ICD-10-CM | POA: Diagnosis not present

## 2019-01-09 DIAGNOSIS — I4819 Other persistent atrial fibrillation: Secondary | ICD-10-CM | POA: Diagnosis not present

## 2019-01-09 DIAGNOSIS — R945 Abnormal results of liver function studies: Secondary | ICD-10-CM | POA: Diagnosis not present

## 2019-01-09 DIAGNOSIS — E1169 Type 2 diabetes mellitus with other specified complication: Secondary | ICD-10-CM | POA: Diagnosis not present

## 2019-01-09 DIAGNOSIS — S0003XD Contusion of scalp, subsequent encounter: Secondary | ICD-10-CM | POA: Diagnosis not present

## 2019-01-09 DIAGNOSIS — I609 Nontraumatic subarachnoid hemorrhage, unspecified: Secondary | ICD-10-CM | POA: Diagnosis not present

## 2019-01-09 DIAGNOSIS — I129 Hypertensive chronic kidney disease with stage 1 through stage 4 chronic kidney disease, or unspecified chronic kidney disease: Secondary | ICD-10-CM | POA: Diagnosis not present

## 2019-01-10 DIAGNOSIS — K529 Noninfective gastroenteritis and colitis, unspecified: Secondary | ICD-10-CM | POA: Diagnosis not present

## 2019-01-13 DIAGNOSIS — M86171 Other acute osteomyelitis, right ankle and foot: Secondary | ICD-10-CM | POA: Diagnosis not present

## 2019-01-13 DIAGNOSIS — Z5181 Encounter for therapeutic drug level monitoring: Secondary | ICD-10-CM | POA: Diagnosis not present

## 2019-01-13 DIAGNOSIS — B965 Pseudomonas (aeruginosa) (mallei) (pseudomallei) as the cause of diseases classified elsewhere: Secondary | ICD-10-CM | POA: Diagnosis not present

## 2019-01-13 DIAGNOSIS — M00871 Arthritis due to other bacteria, right ankle and foot: Secondary | ICD-10-CM | POA: Diagnosis not present

## 2019-01-13 DIAGNOSIS — E1169 Type 2 diabetes mellitus with other specified complication: Secondary | ICD-10-CM | POA: Diagnosis not present

## 2019-01-13 DIAGNOSIS — L03116 Cellulitis of left lower limb: Secondary | ICD-10-CM | POA: Diagnosis not present

## 2019-01-18 DIAGNOSIS — Z5181 Encounter for therapeutic drug level monitoring: Secondary | ICD-10-CM | POA: Diagnosis not present

## 2019-01-18 DIAGNOSIS — B965 Pseudomonas (aeruginosa) (mallei) (pseudomallei) as the cause of diseases classified elsewhere: Secondary | ICD-10-CM | POA: Diagnosis not present

## 2019-01-18 DIAGNOSIS — L03116 Cellulitis of left lower limb: Secondary | ICD-10-CM | POA: Diagnosis not present

## 2019-01-18 DIAGNOSIS — M00871 Arthritis due to other bacteria, right ankle and foot: Secondary | ICD-10-CM | POA: Diagnosis not present

## 2019-01-18 DIAGNOSIS — M86171 Other acute osteomyelitis, right ankle and foot: Secondary | ICD-10-CM | POA: Diagnosis not present

## 2019-01-18 DIAGNOSIS — E1169 Type 2 diabetes mellitus with other specified complication: Secondary | ICD-10-CM | POA: Diagnosis not present

## 2019-01-25 DIAGNOSIS — C7800 Secondary malignant neoplasm of unspecified lung: Secondary | ICD-10-CM | POA: Diagnosis not present

## 2019-01-25 DIAGNOSIS — L03116 Cellulitis of left lower limb: Secondary | ICD-10-CM | POA: Diagnosis not present

## 2019-01-25 DIAGNOSIS — Z5181 Encounter for therapeutic drug level monitoring: Secondary | ICD-10-CM | POA: Diagnosis not present

## 2019-01-25 DIAGNOSIS — C499 Malignant neoplasm of connective and soft tissue, unspecified: Secondary | ICD-10-CM | POA: Diagnosis not present

## 2019-01-25 DIAGNOSIS — C494 Malignant neoplasm of connective and soft tissue of abdomen: Secondary | ICD-10-CM | POA: Diagnosis not present

## 2019-01-25 DIAGNOSIS — M86171 Other acute osteomyelitis, right ankle and foot: Secondary | ICD-10-CM | POA: Diagnosis not present

## 2019-01-25 DIAGNOSIS — B965 Pseudomonas (aeruginosa) (mallei) (pseudomallei) as the cause of diseases classified elsewhere: Secondary | ICD-10-CM | POA: Diagnosis not present

## 2019-01-25 DIAGNOSIS — C490 Malignant neoplasm of connective and soft tissue of head, face and neck: Secondary | ICD-10-CM | POA: Diagnosis not present

## 2019-01-25 DIAGNOSIS — C4449 Other specified malignant neoplasm of skin of scalp and neck: Secondary | ICD-10-CM | POA: Diagnosis not present

## 2019-01-25 DIAGNOSIS — C493 Malignant neoplasm of connective and soft tissue of thorax: Secondary | ICD-10-CM | POA: Diagnosis not present

## 2019-01-25 DIAGNOSIS — M00871 Arthritis due to other bacteria, right ankle and foot: Secondary | ICD-10-CM | POA: Diagnosis not present

## 2019-01-25 DIAGNOSIS — E1169 Type 2 diabetes mellitus with other specified complication: Secondary | ICD-10-CM | POA: Diagnosis not present

## 2019-01-25 DIAGNOSIS — C495 Malignant neoplasm of connective and soft tissue of pelvis: Secondary | ICD-10-CM | POA: Diagnosis not present

## 2019-01-27 DIAGNOSIS — C7800 Secondary malignant neoplasm of unspecified lung: Secondary | ICD-10-CM | POA: Diagnosis not present

## 2019-01-27 DIAGNOSIS — N183 Chronic kidney disease, stage 3 unspecified: Secondary | ICD-10-CM | POA: Diagnosis not present

## 2019-01-27 DIAGNOSIS — I482 Chronic atrial fibrillation, unspecified: Secondary | ICD-10-CM | POA: Diagnosis not present

## 2019-01-27 DIAGNOSIS — K8689 Other specified diseases of pancreas: Secondary | ICD-10-CM | POA: Diagnosis not present

## 2019-01-27 DIAGNOSIS — I739 Peripheral vascular disease, unspecified: Secondary | ICD-10-CM | POA: Diagnosis not present

## 2019-01-27 DIAGNOSIS — M86271 Subacute osteomyelitis, right ankle and foot: Secondary | ICD-10-CM | POA: Diagnosis not present

## 2019-01-27 DIAGNOSIS — I10 Essential (primary) hypertension: Secondary | ICD-10-CM | POA: Diagnosis not present

## 2019-01-27 DIAGNOSIS — Z7409 Other reduced mobility: Secondary | ICD-10-CM | POA: Diagnosis not present

## 2019-01-27 DIAGNOSIS — C490 Malignant neoplasm of connective and soft tissue of head, face and neck: Secondary | ICD-10-CM | POA: Diagnosis not present

## 2019-01-27 DIAGNOSIS — M868X7 Other osteomyelitis, ankle and foot: Secondary | ICD-10-CM | POA: Diagnosis not present

## 2019-01-27 DIAGNOSIS — E1159 Type 2 diabetes mellitus with other circulatory complications: Secondary | ICD-10-CM | POA: Diagnosis not present

## 2019-01-27 DIAGNOSIS — R410 Disorientation, unspecified: Secondary | ICD-10-CM | POA: Diagnosis not present

## 2019-01-27 DIAGNOSIS — R110 Nausea: Secondary | ICD-10-CM | POA: Diagnosis not present

## 2019-01-27 DIAGNOSIS — C4449 Other specified malignant neoplasm of skin of scalp and neck: Secondary | ICD-10-CM | POA: Diagnosis not present

## 2019-02-01 DIAGNOSIS — M86671 Other chronic osteomyelitis, right ankle and foot: Secondary | ICD-10-CM | POA: Diagnosis not present

## 2019-02-01 DIAGNOSIS — A498 Other bacterial infections of unspecified site: Secondary | ICD-10-CM | POA: Diagnosis not present

## 2019-02-01 DIAGNOSIS — Z940 Kidney transplant status: Secondary | ICD-10-CM | POA: Diagnosis not present

## 2019-02-01 DIAGNOSIS — Z792 Long term (current) use of antibiotics: Secondary | ICD-10-CM | POA: Diagnosis not present

## 2019-02-08 DIAGNOSIS — M2142 Flat foot [pes planus] (acquired), left foot: Secondary | ICD-10-CM | POA: Diagnosis not present

## 2019-02-08 DIAGNOSIS — M2140 Flat foot [pes planus] (acquired), unspecified foot: Secondary | ICD-10-CM | POA: Diagnosis not present

## 2019-02-08 DIAGNOSIS — M2141 Flat foot [pes planus] (acquired), right foot: Secondary | ICD-10-CM | POA: Diagnosis not present

## 2019-02-14 DIAGNOSIS — N1831 Chronic kidney disease, stage 3a: Secondary | ICD-10-CM | POA: Diagnosis not present

## 2019-02-14 DIAGNOSIS — I4819 Other persistent atrial fibrillation: Secondary | ICD-10-CM | POA: Diagnosis not present

## 2019-02-14 DIAGNOSIS — Z961 Presence of intraocular lens: Secondary | ICD-10-CM | POA: Diagnosis not present

## 2019-02-14 DIAGNOSIS — H43813 Vitreous degeneration, bilateral: Secondary | ICD-10-CM | POA: Diagnosis not present

## 2019-02-14 DIAGNOSIS — H16223 Keratoconjunctivitis sicca, not specified as Sjogren's, bilateral: Secondary | ICD-10-CM | POA: Diagnosis not present

## 2019-02-14 DIAGNOSIS — H52203 Unspecified astigmatism, bilateral: Secondary | ICD-10-CM | POA: Diagnosis not present

## 2019-02-14 DIAGNOSIS — E1151 Type 2 diabetes mellitus with diabetic peripheral angiopathy without gangrene: Secondary | ICD-10-CM | POA: Diagnosis not present

## 2019-02-14 DIAGNOSIS — H02831 Dermatochalasis of right upper eyelid: Secondary | ICD-10-CM | POA: Diagnosis not present

## 2019-02-14 DIAGNOSIS — H524 Presbyopia: Secondary | ICD-10-CM | POA: Diagnosis not present

## 2019-02-14 DIAGNOSIS — H5203 Hypermetropia, bilateral: Secondary | ICD-10-CM | POA: Diagnosis not present

## 2019-02-14 DIAGNOSIS — H02834 Dermatochalasis of left upper eyelid: Secondary | ICD-10-CM | POA: Diagnosis not present

## 2019-02-14 DIAGNOSIS — E119 Type 2 diabetes mellitus without complications: Secondary | ICD-10-CM | POA: Diagnosis not present

## 2019-02-14 DIAGNOSIS — H26493 Other secondary cataract, bilateral: Secondary | ICD-10-CM | POA: Diagnosis not present

## 2019-02-14 DIAGNOSIS — Z7984 Long term (current) use of oral hypoglycemic drugs: Secondary | ICD-10-CM | POA: Diagnosis not present

## 2019-02-14 DIAGNOSIS — D61818 Other pancytopenia: Secondary | ICD-10-CM | POA: Diagnosis not present

## 2019-02-14 DIAGNOSIS — I10 Essential (primary) hypertension: Secondary | ICD-10-CM | POA: Diagnosis not present

## 2019-03-14 DIAGNOSIS — W1839XA Other fall on same level, initial encounter: Secondary | ICD-10-CM | POA: Diagnosis not present

## 2019-03-14 DIAGNOSIS — Y998 Other external cause status: Secondary | ICD-10-CM | POA: Diagnosis not present

## 2019-03-14 DIAGNOSIS — M86679 Other chronic osteomyelitis, unspecified ankle and foot: Secondary | ICD-10-CM | POA: Diagnosis not present

## 2019-03-14 DIAGNOSIS — Z9989 Dependence on other enabling machines and devices: Secondary | ICD-10-CM | POA: Diagnosis not present

## 2019-03-14 DIAGNOSIS — I4819 Other persistent atrial fibrillation: Secondary | ICD-10-CM | POA: Diagnosis not present

## 2019-03-14 DIAGNOSIS — Z789 Other specified health status: Secondary | ICD-10-CM | POA: Diagnosis not present

## 2019-03-14 DIAGNOSIS — Z681 Body mass index (BMI) 19 or less, adult: Secondary | ICD-10-CM | POA: Diagnosis not present

## 2019-03-14 DIAGNOSIS — S42212A Unspecified displaced fracture of surgical neck of left humerus, initial encounter for closed fracture: Secondary | ICD-10-CM | POA: Diagnosis not present

## 2019-03-14 DIAGNOSIS — S42214A Unspecified nondisplaced fracture of surgical neck of right humerus, initial encounter for closed fracture: Secondary | ICD-10-CM | POA: Diagnosis not present

## 2019-03-14 DIAGNOSIS — M25511 Pain in right shoulder: Secondary | ICD-10-CM | POA: Diagnosis not present

## 2019-03-14 DIAGNOSIS — Z7409 Other reduced mobility: Secondary | ICD-10-CM | POA: Diagnosis not present

## 2019-03-14 DIAGNOSIS — B159 Hepatitis A without hepatic coma: Secondary | ICD-10-CM | POA: Diagnosis not present

## 2019-03-14 DIAGNOSIS — Z940 Kidney transplant status: Secondary | ICD-10-CM | POA: Diagnosis not present

## 2019-03-14 DIAGNOSIS — S50311A Abrasion of right elbow, initial encounter: Secondary | ICD-10-CM | POA: Diagnosis not present

## 2019-03-15 DIAGNOSIS — D696 Thrombocytopenia, unspecified: Secondary | ICD-10-CM | POA: Diagnosis not present

## 2019-03-15 DIAGNOSIS — S42214A Unspecified nondisplaced fracture of surgical neck of right humerus, initial encounter for closed fracture: Secondary | ICD-10-CM | POA: Diagnosis present

## 2019-03-15 DIAGNOSIS — R2689 Other abnormalities of gait and mobility: Secondary | ICD-10-CM | POA: Diagnosis present

## 2019-03-15 DIAGNOSIS — E1159 Type 2 diabetes mellitus with other circulatory complications: Secondary | ICD-10-CM | POA: Diagnosis present

## 2019-03-15 DIAGNOSIS — E559 Vitamin D deficiency, unspecified: Secondary | ICD-10-CM | POA: Diagnosis not present

## 2019-03-15 DIAGNOSIS — E1169 Type 2 diabetes mellitus with other specified complication: Secondary | ICD-10-CM | POA: Diagnosis present

## 2019-03-15 DIAGNOSIS — Z940 Kidney transplant status: Secondary | ICD-10-CM | POA: Diagnosis not present

## 2019-03-15 DIAGNOSIS — M86679 Other chronic osteomyelitis, unspecified ankle and foot: Secondary | ICD-10-CM | POA: Diagnosis present

## 2019-03-15 DIAGNOSIS — E785 Hyperlipidemia, unspecified: Secondary | ICD-10-CM | POA: Diagnosis present

## 2019-03-15 DIAGNOSIS — C7800 Secondary malignant neoplasm of unspecified lung: Secondary | ICD-10-CM | POA: Diagnosis present

## 2019-03-15 DIAGNOSIS — I10 Essential (primary) hypertension: Secondary | ICD-10-CM | POA: Diagnosis present

## 2019-03-15 DIAGNOSIS — M25511 Pain in right shoulder: Secondary | ICD-10-CM | POA: Diagnosis not present

## 2019-03-15 DIAGNOSIS — R7989 Other specified abnormal findings of blood chemistry: Secondary | ICD-10-CM | POA: Diagnosis not present

## 2019-03-15 DIAGNOSIS — J90 Pleural effusion, not elsewhere classified: Secondary | ICD-10-CM | POA: Diagnosis not present

## 2019-03-15 DIAGNOSIS — Z20828 Contact with and (suspected) exposure to other viral communicable diseases: Secondary | ICD-10-CM | POA: Diagnosis not present

## 2019-03-15 DIAGNOSIS — E440 Moderate protein-calorie malnutrition: Secondary | ICD-10-CM | POA: Diagnosis not present

## 2019-03-15 DIAGNOSIS — G4730 Sleep apnea, unspecified: Secondary | ICD-10-CM | POA: Diagnosis present

## 2019-03-15 DIAGNOSIS — K861 Other chronic pancreatitis: Secondary | ICD-10-CM | POA: Diagnosis present

## 2019-03-15 DIAGNOSIS — R188 Other ascites: Secondary | ICD-10-CM | POA: Diagnosis not present

## 2019-03-15 DIAGNOSIS — I4892 Unspecified atrial flutter: Secondary | ICD-10-CM | POA: Diagnosis not present

## 2019-03-15 DIAGNOSIS — Z981 Arthrodesis status: Secondary | ICD-10-CM | POA: Diagnosis not present

## 2019-03-15 DIAGNOSIS — K5730 Diverticulosis of large intestine without perforation or abscess without bleeding: Secondary | ICD-10-CM | POA: Diagnosis not present

## 2019-03-15 DIAGNOSIS — F329 Major depressive disorder, single episode, unspecified: Secondary | ICD-10-CM | POA: Diagnosis not present

## 2019-03-15 DIAGNOSIS — Z681 Body mass index (BMI) 19 or less, adult: Secondary | ICD-10-CM | POA: Diagnosis not present

## 2019-03-15 DIAGNOSIS — R636 Underweight: Secondary | ICD-10-CM | POA: Diagnosis present

## 2019-03-15 DIAGNOSIS — R112 Nausea with vomiting, unspecified: Secondary | ICD-10-CM | POA: Diagnosis not present

## 2019-03-15 DIAGNOSIS — I4510 Unspecified right bundle-branch block: Secondary | ICD-10-CM | POA: Diagnosis not present

## 2019-03-15 DIAGNOSIS — R54 Age-related physical debility: Secondary | ICD-10-CM | POA: Diagnosis present

## 2019-03-15 DIAGNOSIS — B159 Hepatitis A without hepatic coma: Secondary | ICD-10-CM | POA: Diagnosis present

## 2019-03-15 DIAGNOSIS — I2510 Atherosclerotic heart disease of native coronary artery without angina pectoris: Secondary | ICD-10-CM | POA: Diagnosis present

## 2019-03-15 DIAGNOSIS — S42201A Unspecified fracture of upper end of right humerus, initial encounter for closed fracture: Secondary | ICD-10-CM | POA: Diagnosis not present

## 2019-03-15 DIAGNOSIS — R627 Adult failure to thrive: Secondary | ICD-10-CM | POA: Diagnosis present

## 2019-03-15 DIAGNOSIS — I4819 Other persistent atrial fibrillation: Secondary | ICD-10-CM | POA: Diagnosis present

## 2019-03-15 DIAGNOSIS — I4891 Unspecified atrial fibrillation: Secondary | ICD-10-CM | POA: Diagnosis not present

## 2019-03-27 DIAGNOSIS — K861 Other chronic pancreatitis: Secondary | ICD-10-CM | POA: Diagnosis not present

## 2019-03-27 DIAGNOSIS — W19XXXD Unspecified fall, subsequent encounter: Secondary | ICD-10-CM | POA: Diagnosis not present

## 2019-03-27 DIAGNOSIS — E440 Moderate protein-calorie malnutrition: Secondary | ICD-10-CM | POA: Diagnosis not present

## 2019-03-27 DIAGNOSIS — I480 Paroxysmal atrial fibrillation: Secondary | ICD-10-CM | POA: Diagnosis not present

## 2019-03-27 DIAGNOSIS — S42214D Unspecified nondisplaced fracture of surgical neck of right humerus, subsequent encounter for fracture with routine healing: Secondary | ICD-10-CM | POA: Diagnosis not present

## 2019-04-21 DEATH — deceased

## 2019-04-26 ENCOUNTER — Telehealth: Payer: Self-pay | Admitting: Family Medicine

## 2019-04-26 NOTE — Telephone Encounter (Signed)
Informed by his wife that patient passed away following a long illness about 10 days ago.  I offered my condolences, will update chart
